# Patient Record
Sex: Male | Born: 1960 | Race: White | Hispanic: No | Marital: Married | Smoking: Current every day smoker
Health system: Southern US, Community
[De-identification: ages and names within clinical notes are randomized; demographics above are authoritative.]

## PROBLEM LIST (undated history)

## (undated) ENCOUNTER — Emergency Department (HOSPITAL_COMMUNITY): Discharge status: Against Medical Advice | Payer: Self-pay

## (undated) DIAGNOSIS — C801 Malignant (primary) neoplasm, unspecified: Secondary | ICD-10-CM

## (undated) DIAGNOSIS — E785 Hyperlipidemia, unspecified: Secondary | ICD-10-CM

## (undated) DIAGNOSIS — I639 Cerebral infarction, unspecified: Secondary | ICD-10-CM

## (undated) DIAGNOSIS — E876 Hypokalemia: Secondary | ICD-10-CM

## (undated) DIAGNOSIS — I2510 Atherosclerotic heart disease of native coronary artery without angina pectoris: Secondary | ICD-10-CM

## (undated) DIAGNOSIS — M899 Disorder of bone, unspecified: Secondary | ICD-10-CM

## (undated) DIAGNOSIS — M726 Necrotizing fasciitis: Secondary | ICD-10-CM

## (undated) DIAGNOSIS — M75100 Unspecified rotator cuff tear or rupture of unspecified shoulder, not specified as traumatic: Secondary | ICD-10-CM

## (undated) DIAGNOSIS — I2119 ST elevation (STEMI) myocardial infarction involving other coronary artery of inferior wall: Secondary | ICD-10-CM

## (undated) DIAGNOSIS — I10 Essential (primary) hypertension: Secondary | ICD-10-CM

## (undated) HISTORY — DX: Atherosclerotic heart disease of native coronary artery without angina pectoris: I25.10

## (undated) HISTORY — DX: Hypokalemia: E87.6

## (undated) HISTORY — DX: Essential (primary) hypertension: I10

## (undated) HISTORY — PX: TONSILLECTOMY: SUR1361

## (undated) HISTORY — DX: Hyperlipidemia, unspecified: E78.5

## (undated) HISTORY — DX: ST elevation (STEMI) myocardial infarction involving other coronary artery of inferior wall: I21.19

## (undated) HISTORY — PX: CORONARY ANGIOPLASTY WITH STENT PLACEMENT: SHX49

## (undated) HISTORY — DX: Disorder of bone, unspecified: M89.9

## (undated) HISTORY — PX: CYST EXCISION: SHX5701

---

## 2001-06-13 ENCOUNTER — Encounter: Payer: Self-pay | Admitting: Family Medicine

## 2001-06-13 ENCOUNTER — Ambulatory Visit (HOSPITAL_COMMUNITY): Admission: RE | Admit: 2001-06-13 | Discharge: 2001-06-13 | Payer: Self-pay | Admitting: Family Medicine

## 2001-07-27 ENCOUNTER — Emergency Department (HOSPITAL_COMMUNITY): Admission: EM | Admit: 2001-07-27 | Discharge: 2001-07-28 | Payer: Self-pay | Admitting: Emergency Medicine

## 2008-05-13 ENCOUNTER — Inpatient Hospital Stay (HOSPITAL_COMMUNITY): Admission: EM | Admit: 2008-05-13 | Discharge: 2008-05-15 | Payer: Self-pay | Admitting: Cardiology

## 2008-05-13 ENCOUNTER — Ambulatory Visit: Payer: Self-pay | Admitting: Cardiology

## 2008-05-17 ENCOUNTER — Ambulatory Visit: Payer: Self-pay | Admitting: Cardiology

## 2008-06-05 DIAGNOSIS — I10 Essential (primary) hypertension: Secondary | ICD-10-CM

## 2008-06-05 DIAGNOSIS — I2119 ST elevation (STEMI) myocardial infarction involving other coronary artery of inferior wall: Secondary | ICD-10-CM | POA: Insufficient documentation

## 2008-06-05 DIAGNOSIS — E785 Hyperlipidemia, unspecified: Secondary | ICD-10-CM

## 2008-06-05 DIAGNOSIS — I2510 Atherosclerotic heart disease of native coronary artery without angina pectoris: Secondary | ICD-10-CM | POA: Insufficient documentation

## 2010-07-08 LAB — CBC
HCT: 38.2 % — ABNORMAL LOW (ref 39.0–52.0)
HCT: 39 % (ref 39.0–52.0)
HCT: 40.6 % (ref 39.0–52.0)
Hemoglobin: 14.1 g/dL (ref 13.0–17.0)
Hemoglobin: 14.4 g/dL (ref 13.0–17.0)
MCHC: 35.6 g/dL (ref 30.0–36.0)
MCHC: 35.8 g/dL (ref 30.0–36.0)
MCV: 86.5 fL (ref 78.0–100.0)
MCV: 87.2 fL (ref 78.0–100.0)
Platelets: 166 10*3/uL (ref 150–400)
Platelets: 213 10*3/uL (ref 150–400)
RBC: 4.65 MIL/uL (ref 4.22–5.81)
RDW: 14.6 % (ref 11.5–15.5)
RDW: 15.5 % (ref 11.5–15.5)
WBC: 9.8 10*3/uL (ref 4.0–10.5)

## 2010-07-08 LAB — BASIC METABOLIC PANEL
BUN: 8 mg/dL (ref 6–23)
BUN: 8 mg/dL (ref 6–23)
CO2: 23 mEq/L (ref 19–32)
CO2: 25 mEq/L (ref 19–32)
CO2: 26 mEq/L (ref 19–32)
CO2: 26 mEq/L (ref 19–32)
Calcium: 8.1 mg/dL — ABNORMAL LOW (ref 8.4–10.5)
Calcium: 8.3 mg/dL — ABNORMAL LOW (ref 8.4–10.5)
Calcium: 8.5 mg/dL (ref 8.4–10.5)
Chloride: 105 mEq/L (ref 96–112)
Chloride: 108 mEq/L (ref 96–112)
Chloride: 110 mEq/L (ref 96–112)
Creatinine, Ser: 0.67 mg/dL (ref 0.4–1.5)
GFR calc Af Amer: >60 mL/min (ref >60)
GFR calc Af Amer: >60 mL/min (ref >60)
GFR calc Af Amer: >60 mL/min (ref >60)
GFR calc non Af Amer: >60 mL/min (ref >60)
GFR calc non Af Amer: >60 mL/min (ref >60)
Glucose, Bld: 108 mg/dL — ABNORMAL HIGH (ref 70–99)
Glucose, Bld: 114 mg/dL — ABNORMAL HIGH (ref 70–99)
Glucose, Bld: 133 mg/dL — ABNORMAL HIGH (ref 70–99)
Potassium: 3.2 mEq/L — ABNORMAL LOW (ref 3.5–5.1)
Potassium: 3.2 mEq/L — ABNORMAL LOW (ref 3.5–5.1)
Potassium: 3.5 mEq/L (ref 3.5–5.1)
Potassium: 3.8 mEq/L (ref 3.5–5.1)
Sodium: 135 mEq/L (ref 135–145)
Sodium: 140 mEq/L (ref 135–145)
Sodium: 141 mEq/L (ref 135–145)

## 2010-07-08 LAB — LIPID PANEL
Cholesterol: 121 mg/dL (ref 0–200)
HDL: 20 mg/dL — ABNORMAL LOW (ref >39)
Total CHOL/HDL Ratio: 6.1 RATIO
VLDL: 16 mg/dL (ref 0–40)

## 2010-07-08 LAB — CARDIAC PANEL(CRET KIN+CKTOT+MB+TROPI)
CK, MB: 212.9 ng/mL — ABNORMAL HIGH (ref 0.3–4.0)
Relative Index: 10.1 — ABNORMAL HIGH (ref 0.0–2.5)
Total CK: 2109 U/L — ABNORMAL HIGH (ref 7–232)
Total CK: 704 U/L — ABNORMAL HIGH (ref 7–232)
Troponin I: 45.96 ng/mL (ref 0.00–0.06)

## 2010-07-08 LAB — POCT I-STAT, CHEM 8
BUN: 12 mg/dL (ref 6–23)
Calcium, Ion: 1.15 mmol/L (ref 1.12–1.32)
Chloride: 106 mEq/L (ref 96–112)
Creatinine, Ser: 0.7 mg/dL (ref 0.4–1.5)
Glucose, Bld: 140 mg/dL — ABNORMAL HIGH (ref 70–99)
HCT: 42 % (ref 39.0–52.0)
Hemoglobin: 14.3 g/dL (ref 13.0–17.0)
Potassium: 3.4 mEq/L — ABNORMAL LOW (ref 3.5–5.1)
Sodium: 144 mEq/L (ref 135–145)
TCO2: 23 mmol/L (ref 0–100)

## 2010-07-08 LAB — HEMOGLOBIN A1C: Mean Plasma Glucose: 114 mg/dL

## 2010-08-05 NOTE — H&P (Signed)
NAMENAHSHON, REICH NO.:  [id number]   MEDICAL RECORD NO.:  [id number]          PATIENT TYPE:  INP   LOCATION:  2911                         FACILITY:  [facility]   PHYSICIAN:  Rollene Rotunda, MD, FACCDATE OF BIRTH:  1960-09-12   DATE OF ADMISSION:  05/13/2008  DATE OF DISCHARGE:                              HISTORY & PHYSICAL   PRIMARY:  None.   CARDIOLOGIST:  Charlies Constable, MD.   REASON FOR PRESENTATION:  Evaluate patient with inferior infarct and  atrial fibrillation.   HISTORY OF PRESENT ILLNESS:  The patient is a pleasant 50 year old  gentleman without prior cardiac history.  He awoke at 3 a.m. with chest  pain.  This was substernal.  It was 10/10.  He had his family drive him  to [facility].  He arrived there at 3:34 a.m.  An  electrocardiogram demonstrated atrial fibrillation with a rapid rate and  inferior ST-segment elevation in II, III and aVF.  He was treated with  Plavix (it appears that it was 600 mg), heparin, aspirin, IV  nitroglycerin, metoprolol and morphine.  His pain remained 8/10.  He was  transferred urgently to [facility] and arrived here at 4:47 a.m.  He is  now getting urgent cardiac catheterization by Dr. Charlies Constable.   The patient said he had no symptoms prior to this.  He has not  previously had chest pain or resting shortness of breath.  He will  rarely have palpitations, has had no presyncope or syncope and no  sustained tachyarrhythmias.   PAST MEDICAL HISTORY:  None.   PAST SURGICAL HISTORY:  None.   ALLERGIES:  None.   MEDICATIONS:  None.   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  He had 3 packs per  day of tobacco.   FAMILY HISTORY:  Not obtained fro the patient.   REVIEW OF SYSTEMS:  As stated in the HPI and positive for some  hemorrhoids.  Negative for all other systems.   PHYSICAL EXAMINATION:  The patient is in mild distress.  Blood pressure  160/100, heart rate 154 and irregular, respiratory  rate 20.  HEENT:  Eyes unremarkable, pupils equal, round and react to light, fundi  not visualized, oral mucosa unremarkable.  NECK:  No jugular venous  distention at 45 degrees, carotid upstroke brisk and symmetric, no  bruits, no thyromegaly.  LYMPHATICS:  No cervical or inguinal adenopathy.  LUNGS:  Decreased breath sounds but no wheezing or crackles.  BACK:  No costovertebral angle tenderness.  CHEST:  Unremarkable.  HEART:  PMI not displaced or sustained, S1 and S2 within normal.  No S3,  no murmurs.  ABDOMEN:  Obese, positive bowel sounds normal in frequency and pitch, no  bruits, no rebound, no guarding or midline pulsatile mass, no  organomegaly.  SKIN:  No rashes, no nodules.  EXTREMITIES:  Pulses 2+, no edema.   LABS:  Pending.   Electrocardiogram as above.   ASSESSMENT/PLAN:  1. Acute inferior myocardial infarction.  The patient is urgently in      the cath lab for  catheterization.  This is per Dr. Juanda Chance.  2. Atrial fibrillation.  Will control the rate initially with      verapamil and Cardizem as needed.  Will consider cardioversion      during this hospitalization.  3. Tobacco.  Will educate the patient.  4. Dyslipidemia.  Will empirically start him on statins and check a      fasting lipid profile.  5. Other.  Will check a hemoglobin A1c.      Rollene Rotunda, MD, [facility]  Electronically Signed     JH/MEDQ  D:  05/13/2008  T:  05/13/2008  Job:  [id number]

## 2010-08-05 NOTE — Cardiovascular Report (Signed)
Ian Roberts, Ian Roberts NO.:  [id number]   MEDICAL RECORD NO.:  [id number]          PATIENT TYPE:  INP   LOCATION:  2911                         FACILITY:  [facility]   PHYSICIAN:  Everardo Beals. Juanda Chance, MD, FACCDATE OF BIRTH:  [birth year]   DATE OF PROCEDURE:  05/13/2008  DATE OF DISCHARGE:                            CARDIAC CATHETERIZATION   Cardiac catheterization and percutaneous transluminal coronary  angioplasty.   CLINICAL HISTORY:  Mr. Buysse is 50 years old and no prior history of  known heart disease.  He has been on no medications.  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  He developed the onset  of chest pain at 3:00 a.m. and went to [facility] where his ECG  showed an inferior infarction and atrial fibrillation.  Code STEMI was  called and he was transferred to [place] for intervention.  He was seen by  Dr. Antoine Poche in the cath lab.   PROCEDURE:  Procedure was followed by the right femoral arterial sheath  and 6-French preformed coronary catheters.  After completion of  diagnostic study, we made a decision and proceeded with intervention on  the totally occluded distal circumflex artery.   The patient was given Angiomax bolus and infusion.  He had been given  600 mg of clopidogrel and 4 chewable aspirin at [facility].  We  used a CLS4 guiding catheter with side holes and a Prowater wire.  We  crossed the lesion with the wire without difficulty.  We dilated the  lesion with a 2.25 x 50-mm apex balloon performing 2 inflation up to 8  atmospheres for 30 seconds.  We realized there was a bifurcation of 2  posterolateral branches.  The branches were similar in caliber.  We  elected to stent in the AV branch and crossed the first posterolateral  branch.  We used a 2.5 x 18 mm XIENCE stent and deployed this with one  inflation of 9 atmospheres for 30 seconds.  We then postdilated with  2.75 x 15-mm [place] Voyager performing 2 inflations  up to 15 atmospheres for  30 seconds.  This pinched the first posterolateral branch to about 80%  with brisk flow and we felt that did not need treatment.  The left  angiogram was performed at the end of the procedure.  The patient  tolerated the procedure well and left the laboratory in satisfactory  condition.  The right femoral artery was closed with Angio-Seal at the  end of the procedure.   RESULTS:  LEFT MAIN CORONARY ARTERY:  The left main coronary artery was  free of significant disease.   LEFT ANTERIOR DESCENDING ARTERY:  The left descending artery gave rise  to a large septal perforator, and large diagonal branches and a second  septal perforator and second moderate sized diagonal branch.  There was  40% narrowing in the proximal LAD and 40% narrowing in the mid LAD.   CIRCUMFLEX ARTERY:  The circumflex artery gave rise to a large ramus  branch, small marginal branch, and atrial branch, and then was  completely  occluded distally with hangup of contrast.   RIGHT CORONARY ARTERY:  The right coronary artery was a moderate-sized  vessel gave rise to a right ventricular branch, posterior descending  branch, and two small posterolateral branches.  These vessels were  irregular, but free of major obstruction.   The left ventriculogram performed after the PCI showed very minimal  hypokinesis of the mid inferior wall.  The estimated ejection fraction  was 60%.   Following stenting of the lesion in distal circumflex, stenosis improved  from 100% to 0%.  The first posterolateral branch which was crossed and  jailed by the stent was pinched to about 80%, but with brisk TIMI-3  flow.   The patient had the onset of chest pain at 3 a.m. and arrived at  [facility] at 3:37 a.m.  He arrived in the cath lab at 4:47 a.m.  First balloon inflation was at 5:24.  This gave a __________balloon time  from [facility] of 107 minutes and a reperfusion time of 2 hours  and 24  minutes.   CONCLUSION:  1. Acute diaphragmatic wall infarction with total occlusion of the      distal circumflex artery, 40% narrowing in the proximal and 40%      narrowing in the mid LAD, irregularities in the right coronary      artery and minimal inferior wall hypokinesis.  2. Successful  reperfusion and PCI of the distal circumflex artery      with improvement in center narrowing from 100% to 0%, improvement      in the flow from TIMI-0 to TIMI-3 flow with pinching of jailed side      branch (posterolateral branch-1) to 80%.   DISPOSITION:  The patient was returned to post angio room for further  observation.  He was in atrial fibrillation during the procedure and he  was treated with IV Cardizem and converted to sinus rhythm during the  procedure.  He is a heavy smoker and stopping smoking and compliance  with Plavix will be extremely important.  We will target discharge on  day #2, which would be Tuesday.  Dr. Antoine Poche had discussed with the  patient taking Plavix before we did the procedure and he felt that he  could manage despite the fact that he has no insurance.       Bruce Elvera Lennox Juanda Chance, MD, [facility]  Electronically Signed     BRB/MEDQ  D:  05/13/2008  T:  05/13/2008  Job:  [id number]   cc:   Kathrynn Ducking, MD, [facility]

## 2010-08-05 NOTE — Assessment & Plan Note (Signed)
[facility]                       [place] [facility] OFFICE NOTE   NAME:Ian Roberts, Ian Roberts                       MRN:          [id number]  DATE:05/17/2008                            DOB:          [birth year]    CARDIOLOGIST:  Jonelle Sidle, MD   PRIMARY CARE PHYSICIAN:  None.   REASON FOR VISIT:  Post-hospitalization followup.   HISTORY OF PRESENT ILLNESS:  Ian Roberts is a 50 year old male who  recently presented to [facility] with chest pain and EKG evidence  of an inferior ST elevation myocardial infarction.  He developed atrial  fibrillation with rapid ventricular rate.  He was transferred to [facility] for further evaluation and therapy.  He received IV  diltiazem in the cath lab and converted to sinus rhythm.  His cardiac  catheterization demonstrated a total occlusion of the distal left  circumflex artery which was treated with XIENCE drug-eluting stent by  Dr. Juanda Chance.  His other disease included a 40% proximal, 40% mid LAD  stenosis, and irregularities in the RCA.  His EF was 60% and he had very  minimal hypokinesis of the mid inferior wall.  The patient also  underwent CT angiogram of his abdomen.  Review of his lab work does  demonstrate that he had a potassium of 3.2 to 3.4 during his admission.  The CT scan was negative for renal artery stenosis.  He did have mild  atheromatous plaque in the nondilated abdominal aorta and there was no  adrenal mass noted.  The patient's chest x-ray showed mild bibasilar  atelectasis.  His peak troponin was 45.96 and his peak CK-MB was 212.9.  His hemoglobin was normal 14.4.  The patient was apparently told that he  would be going home on Wednesday (yesterday).  However, his family lives  in [place] and he was concerned about severe weather coming and states that  he wanted to get discharged on Tuesday night.  This was apparently not  arranged to his liking, and the patient decided to  leave against medical  advice.  He states he did not receive any prescriptions prior to  leaving.  Therefore, he has only been taking aspirin 325 mg a day since  yesterday.  In the office, he notes he is doing well without chest pain  or shortness of breath.  He denies orthopnea, PND, or pedal edema.  Denies any syncope.  He denies any problems with his right groin site.   CURRENT MEDICATIONS:  Aspirin 325 mg daily.   PHYSICAL EXAMINATION:  GENERAL:  He is a well-nourished, well-developed  male in no acute distress.  VITAL SIGNS:  Blood pressure is 130/84, pulse 87, and weight 209 pounds.  HEENT:  Normal neck without JVD.  CARDIAC:  Normal S1 and S2.  Regular rate and rhythm.  LUNGS:  Clear to auscultation bilaterally.  ABDOMEN:  Soft and nontender.  EXTREMITIES:  Without edema.  NEUROLOGIC:  He is alert and oriented x3.  Cranial nerves II-XII grossly  intact.  VASCULAR:  Right femoral arteriotomy site without hematoma or bruit.   Electrocardiogram reveals sinus rhythm  with a heart of 87.  Normal axis.  No acute changes.   ASSESSMENT AND PLAN:  1. Coronary artery disease, status post recent inferior ST elevation      myocardial infarction treated with XIENCE drug-eluting stent to the      circumflex with overall preserved left ventricular function and      very minimal inferior wall hypokinesis on left ventriculogram at      cardiac catheterization.  As noted above, he had minimal coronary      artery disease located in his proximal and mid left anterior      descending.  Unfortunately, the patient left [facility] against      medical advice.  He also has no insurance.  He is not taking any      medications that he was taking in the hospital except aspirin.  I      had a long discussion with him today regarding the importance of      taking aspirin and Plavix for the next 12 months.  He understands      this.  We will try to enroll him in the assistance program and we      will  hopefully be able to give him 7 tablets of Plavix today.  He      will also be placed on metoprolol 25 mg b.i.d.  He also will be      placed on simvastatin 10 mg nightly.  We will also give him p.r.n.      nitroglycerin.  He states that he has committed to quitting smoking      and has not smoked in the last several days and he is currently      wearing a nicotine patch.  We will refer him to Cardiac Rehab.  I      explained to him that if he cannot afford this, he should start      walking and slowly increase to 30 minutes a day.  We will see him      back in close followup in the next couple of weeks.  He prefers to      come to our [facility] [employer] [facility], although he lives in [place].  2. Dyslipidemia.  His total cholesterol is 121, triglycerides 81, HDL      20, and LDL 85.  As noted above, we will start him on  simvastatin.      Recheck lipids and LFTs in 3 months.  3. Hypokalemia.  I assume he underwent a CT scan of the abdomen to      rule out the possibility of hyperaldosteronism and adrenal adenoma.      As noted above, this was negative for adrenal mass.  We will      recheck his potassium with a BMET today.  If he continues to be      hypokalemic, we can add potassium to his medical regimen.   DISPOSITION:  The patient will follow up with me or Dr. Diona Browner in the  next 2 weeks or sooner p.r.n.      Tereso Newcomer, PA-C  Electronically Signed      Jonelle Sidle, MD  Electronically Signed   SW/MedQ  DD: 05/17/2008  DT: 05/18/2008  Job #: [id number]

## 2010-08-08 NOTE — Discharge Summary (Signed)
NAMEPARLEY, PIDCOCK NO.:  [id number]   MEDICAL RECORD NO.:  [id number]          PATIENT TYPE:  INP   LOCATION:  4732                         FACILITY:  [facility]   PHYSICIAN:  Madolyn Frieze. Jens Som, MD, FACCDATE OF BIRTH:  08/30/1960   DATE OF ADMISSION:  05/13/2008  DATE OF DISCHARGE:  05/15/2008                               DISCHARGE SUMMARY   PRIMARY CARDIOLOGIST:  Everardo Beals. Juanda Chance, MD, [facility]   PRIMARY CARE PHYSICIAN:  None.   PROCEDURES PERFORMED DURING HOSPITALIZATION:  1. Cardiac catheterization.  This was completed on May 13, 2008      by Dr. Charlies Constable.      a.     Acute diaphragmatic wall infarction with total occlusion of       the distal coronary artery, 40% narrowing in the proximal and 40%       narrowing in the mid left anterior descending, irregularities in       the right coronary artery and minimal inferior wall hypokinesis.      b.     Successful reperfusion and percutaneous coronary       intervention of the distal circumflex artery with improvement in       the central narrowing from 100% to 0%, improvement in the flow       from TIMI 0 to TIMI III flow with pinching and jailed branch,       posterolateral - 1 to 80%.   FINAL DISCHARGE DIAGNOSES:  1. Coronary artery disease.  A:  Status post cardiac catheterization revealing total occlusion of the  distal left circumflex artery with percutaneous coronary intervention to  the circumflex artery decreasing from 100% to 0% stenosis with  improvement of flow.  1. Inferior ST elevated myocardial infarction.  2. Hypokalemia.  3. Tobacco abuse.  4. Atrial fibrillation resolved after percutaneous coronary      intervention.  5. Nonsustained ventricular tachycardia, resolved secondary to early      reperfusion.   HOSPITAL COURSE:  This is a 50 year old male without prior cardiac  history who awoke at 3 a.m. on morning of admission which chest pain,  substernal 10/10.  His family drove him to  [facility] where he  arrived and electrocardiogram revealed atrial fibrillation with RVR and  ST-segment elevation in leads II, III and aVF.  The patient was treated  with Plavix 60 mg, heparin, aspirin, IV nitro, metoprolol, morphine and  he was transferred urgently to [facility] for emergent cardiac  catheterization completed by Dr. Charlies Constable.   The patient did have catheterization as described above.  Please see Dr.  Regino Schultze thorough cardiac catheterization note for more details.  The  patient recovered well without evidence of bleeding, hematoma, or signs  of infection.  The patient was enrolled in the [place] registry as a  result of the patient's ST elevated MI and being talked to by [facility]  to be added to the [facility] patient list.  The patient was started on  atorvastatin, metoprolol and smoking cessation was completed.  On day of  discharge, the patient left  [facility] as it was snowing and he did not wish to  stay the night and wanted to get home before he was snowed in and  therefore signed AMA papers in order to be discharged.  Nicolasa Ducking, nurse practitioner for [facility] was informed.  The  patient left without prescriptions for medications and followup  appointments.  The patient should follow up with [facility] cardiologist;  however, it is uncertain whether he will do this as he did leave AMA.   MEDICATIONS:  The patient should be taking on discharge atorvastatin 80  mg daily, metoprolol 25 mg b.i.d. lisinopril 5 mg daily.   ALLERGIES:  No known drug allergies.   LABS ON DISCHARGE:  Cholesterol 121, triglycerides 81, HDL 20, LDL 85.  Sodium 135, potassium 3.2, chloride 105, CO2 23, glucose 119, BUN 9,  creatinine 0.76.  Hemoglobin 14.4, hematocrit 40.6, white blood cells  9.8, platelets are 168.  Hemoglobin A1c 5.6, troponin 22.75 and 45.96  respectively.   FOLLOWUP PLANS AND APPOINTMENT:  The patient has been advised to stay in  the hospital  that he did sign out AMA.  He is to follow up with  cardiologist in [place].  The patient has been advised on smoking cessation  and also on care of right groin site for evidence of bleeding, hematoma,  or infection.      Bettey Mare. Lyman Bishop, NP      Madolyn Frieze. Jens Som, MD, [facility]  Electronically Signed    KML/MEDQ  D:  07/24/2008  T:  07/25/2008  Job:  [id number]

## 2011-03-09 ENCOUNTER — Encounter: Payer: Self-pay | Admitting: Cardiology

## 2018-01-22 ENCOUNTER — Emergency Department (HOSPITAL_COMMUNITY): Payer: Medicaid Other

## 2018-01-22 ENCOUNTER — Other Ambulatory Visit: Payer: Self-pay

## 2018-01-22 ENCOUNTER — Encounter (HOSPITAL_COMMUNITY): Payer: Self-pay | Admitting: Emergency Medicine

## 2018-01-22 ENCOUNTER — Emergency Department (HOSPITAL_COMMUNITY)
Admission: EM | Admit: 2018-01-22 | Discharge: 2018-01-22 | Disposition: A | Discharge status: Home / Self Care | Payer: Medicaid Other | Attending: Emergency Medicine | Admitting: Emergency Medicine

## 2018-01-22 DIAGNOSIS — I10 Essential (primary) hypertension: Secondary | ICD-10-CM | POA: Diagnosis not present

## 2018-01-22 DIAGNOSIS — Z8673 Personal history of transient ischemic attack (TIA), and cerebral infarction without residual deficits: Secondary | ICD-10-CM | POA: Insufficient documentation

## 2018-01-22 DIAGNOSIS — M899 Disorder of bone, unspecified: Secondary | ICD-10-CM

## 2018-01-22 DIAGNOSIS — Z955 Presence of coronary angioplasty implant and graft: Secondary | ICD-10-CM | POA: Diagnosis not present

## 2018-01-22 DIAGNOSIS — F17210 Nicotine dependence, cigarettes, uncomplicated: Secondary | ICD-10-CM | POA: Diagnosis not present

## 2018-01-22 DIAGNOSIS — M7591 Shoulder lesion, unspecified, right shoulder: Secondary | ICD-10-CM | POA: Diagnosis not present

## 2018-01-22 DIAGNOSIS — I2510 Atherosclerotic heart disease of native coronary artery without angina pectoris: Secondary | ICD-10-CM | POA: Insufficient documentation

## 2018-01-22 DIAGNOSIS — M25511 Pain in right shoulder: Secondary | ICD-10-CM | POA: Diagnosis present

## 2018-01-22 HISTORY — DX: Unspecified rotator cuff tear or rupture of unspecified shoulder, not specified as traumatic: M75.100

## 2018-01-22 HISTORY — DX: Cerebral infarction, unspecified: I63.9

## 2018-01-22 HISTORY — DX: Necrotizing fasciitis: M72.6

## 2018-01-22 LAB — BASIC METABOLIC PANEL
Anion gap: 10 (ref 5–15)
BUN: 15 mg/dL (ref 6–20)
CALCIUM: 9.4 mg/dL (ref 8.9–10.3)
CO2: 20 mmol/L — ABNORMAL LOW (ref 22–32)
Chloride: 105 mmol/L (ref 98–111)
Creatinine, Ser: 0.88 mg/dL (ref 0.61–1.24)
GFR calc Af Amer: >60 mL/min (ref >60)
GLUCOSE: 201 mg/dL — AB (ref 70–99)
Potassium: 2.7 mmol/L — CL (ref 3.5–5.1)
SODIUM: 135 mmol/L (ref 135–145)

## 2018-01-22 LAB — CBC WITH DIFFERENTIAL/PLATELET
Abs Immature Granulocytes: 0.11 10*3/uL — ABNORMAL HIGH (ref 0.00–0.07)
BASOS PCT: 0 %
Basophils Absolute: 0 10*3/uL (ref 0.0–0.1)
EOS ABS: 0.1 10*3/uL (ref 0.0–0.5)
Eosinophils Relative: 0 %
HCT: 37.9 % — ABNORMAL LOW (ref 39.0–52.0)
Hemoglobin: 12.9 g/dL — ABNORMAL LOW (ref 13.0–17.0)
Immature Granulocytes: 1 %
Lymphocytes Relative: 8 %
Lymphs Abs: 1.4 10*3/uL (ref 0.7–4.0)
MCH: 28.8 pg (ref 26.0–34.0)
MCHC: 34 g/dL (ref 30.0–36.0)
MCV: 84.6 fL (ref 80.0–100.0)
MONO ABS: 0.8 10*3/uL (ref 0.1–1.0)
MONOS PCT: 4 %
Neutro Abs: 15.5 10*3/uL — ABNORMAL HIGH (ref 1.7–7.7)
Neutrophils Relative %: 87 %
PLATELETS: 370 10*3/uL (ref 150–400)
RBC: 4.48 MIL/uL (ref 4.22–5.81)
RDW: 14.1 % (ref 11.5–15.5)
WBC: 17.9 10*3/uL — AB (ref 4.0–10.5)
nRBC: 0 % (ref 0.0–0.2)

## 2018-01-22 MED ORDER — POTASSIUM CHLORIDE CRYS ER 20 MEQ PO TBCR: 40.0000 meq | EXTENDED_RELEASE_TABLET | Freq: Every day | ORAL | 0 refills | Status: DC

## 2018-01-22 MED ORDER — KETOROLAC TROMETHAMINE 30 MG/ML IJ SOLN
30.0000 mg | Freq: Once | INTRAMUSCULAR | Status: AC
  Administered 2018-01-22: 30 mg via INTRAMUSCULAR
  Filled 2018-01-22: qty 1

## 2018-01-22 MED ORDER — MORPHINE SULFATE (PF) 4 MG/ML IV SOLN
4.0000 mg | Freq: Once | INTRAVENOUS | Status: AC
  Administered 2018-01-22: 4 mg via INTRAVENOUS
  Filled 2018-01-22: qty 1

## 2018-01-22 MED ORDER — POTASSIUM CHLORIDE CRYS ER 20 MEQ PO TBCR
40.0000 meq | EXTENDED_RELEASE_TABLET | Freq: Once | ORAL | Status: AC
  Administered 2018-01-22: 40 meq via ORAL
  Filled 2018-01-22: qty 2

## 2018-01-22 MED ORDER — MORPHINE SULFATE 30 MG PO TABS: 30.0000 mg | ORAL_TABLET | Freq: Four times a day (QID) | ORAL | 0 refills | Status: DC | PRN

## 2018-01-22 MED ORDER — ONDANSETRON HCL 4 MG/2ML IJ SOLN
4.0000 mg | Freq: Once | INTRAMUSCULAR | Status: AC
  Administered 2018-01-22: 4 mg via INTRAVENOUS
  Filled 2018-01-22: qty 2

## 2018-01-22 MED ORDER — CYCLOBENZAPRINE HCL 10 MG PO TABS
10.0000 mg | ORAL_TABLET | Freq: Once | ORAL | Status: AC
  Administered 2018-01-22: 10 mg via ORAL
  Filled 2018-01-22: qty 1

## 2018-01-22 MED ORDER — ONDANSETRON 4 MG PO TBDP: 4.0000 mg | ORAL_TABLET | Freq: Three times a day (TID) | ORAL | 0 refills | Status: DC | PRN

## 2018-01-22 NOTE — ED Notes (Signed)
Date and time results received: 01/22/18 8:54 PM (use smartphrase ".now" to insert current time)  Test: K+ Critical Value: 2.7  Name of Provider Notified: Dr Coralyn Helling  Orders Received? Or Actions Taken?: not at this time. see EMR for details

## 2018-01-22 NOTE — ED Provider Notes (Signed)
Emergency Department Provider Note   I have reviewed the triage vital signs and the nursing notes.   HISTORY  Chief Complaint Shoulder Pain   HPI Ian Roberts is a 57 y.o. male with PMH of CAD, HLD, and HTN resents to the emergency department for evaluation of right shoulder pain.  Symptoms have been severe and worsening over the past several days.  He has been seen at the [facility] emergency department, his PCPs office, and a local orthopedic clinic.  He had a steroid injection yesterday and this morning at 3 AM had sudden worsening pain.  He denies any fevers or chills.  He has severe pain that is worse with movement of the joint.  He has been taking 800 mg ibuprofen and leftover Percocet at home with no significant relief.  He states the Percocet makes him vomit and he is concerned about developing a dependency on this medication.  The pain does sometimes radiate to the right bicep and right lateral neck but most of the pain is worse in the right shoulder.    Past Medical History:  Diagnosis Date  . CAD (coronary artery disease)   . Dyslipidemia   . Hypertension   . Hypokalemia   . Necrotizing fasciitis ([place])   . Rotator cuff tear   . ST elevation myocardial infarction (STEMI) of inferior wall (HCC)    Assocaited with paroxysmal atrial fibrillation 05/13/2008; Xience drug eluting stent  . Stroke [facility])     Patient Active Problem List   Diagnosis Date Noted  . DYSLIPIDEMIA 06/05/2008  . HYPERTENSION, BENIGN ESSENTIAL 06/05/2008  . ACUT MI OTH INF WALL SUBSQT EPIS CARE 06/05/2008  . CORONARY ATHEROSCLEROSIS NATIVE CORONARY ARTERY 06/05/2008    Past Surgical History:  Procedure Laterality Date  . CORONARY ANGIOPLASTY WITH STENT PLACEMENT    . CYST EXCISION    . TONSILLECTOMY      Allergies Hydrocodone  No family history on file.  Social History Social History   Tobacco Use  . Smoking status: Current Every Day Smoker    Packs/day: 1.50    Years: 40.00   Pack years: 60.00    Types: Cigarettes  . Smokeless tobacco: Never Used  Substance Use Topics  . Alcohol use: Never    Frequency: Never  . Drug use: Never    Review of Systems  Constitutional: No fever/chills Eyes: No visual changes. ENT: No sore throat. Cardiovascular: Denies chest pain. Respiratory: Denies shortness of breath. Gastrointestinal: No abdominal pain.  No nausea, no vomiting.  No diarrhea.  No constipation. Genitourinary: Negative for dysuria. Musculoskeletal: Negative for back pain. Positive right shoulder pain.  Skin: Negative for rash. Neurological: Negative for headaches, focal weakness or numbness.  10-point ROS otherwise negative.  ____________________________________________   PHYSICAL EXAM:  VITAL SIGNS: ED Triage Vitals  Enc Vitals Group     BP 01/22/18 1835 (!) 158/80     Pulse Rate 01/22/18 1835 77     Resp 01/22/18 1835 20     Temp 01/22/18 1835 (!) 97.5 F (36.4 C)     Temp Source 01/22/18 1835 Oral     SpO2 01/22/18 1835 98 %     Weight 01/22/18 1835 210 lb (95.3 kg)     Height 01/22/18 1835 5\' 11"  (1.803 m)     Pain Score 01/22/18 1836 10   Constitutional: Alert and oriented. Well appearing and in no acute distress. Eyes: Conjunctivae are normal.  Head: Atraumatic. Nose: No congestion/rhinnorhea. Mouth/Throat: Mucous membranes are moist.  Neck: No stridor. No cervical spine tenderness to palpation. Cardiovascular: Good peripheral circulation.  Respiratory: Normal respiratory effort.  Gastrointestinal: No distention.  Musculoskeletal: No lower extremity tenderness nor edema. No gross deformities of extremities. Pain with active and passive ROM of the right shoulder. No elbow or wrist tenderness.  Neurologic:  Normal speech and language. No gross focal neurologic deficits are appreciated.  Skin:  Skin is warm, dry and intact. No rash noted.  ____________________________________________  FUXNATFTD  Dg Shoulder Right  Result Date:  01/22/2018 CLINICAL DATA:  Right shoulder pain for the past month. EXAM: RIGHT SHOULDER - 2+ VIEW COMPARISON:  Right shoulder x-rays dated December 26, 2017. FINDINGS: No acute fracture or dislocation. There is increasing lucency and cortical irregularity of the superior glenoid extending into the coracoid with periosteal reaction. Joint spaces are preserved. Soft tissues are unremarkable. IMPRESSION: 1. Increasing lucency and cortical irregularity of the superior glenoid extending into the coracoid, concerning for underlying destructive bony lesion or infection. Recommend CT or MRI of the right shoulder for further evaluation. Electronically Signed   By: Titus Dubin M.D.   On: 01/22/2018 19:42   Ct Shoulder Right Wo Contrast  Result Date: 01/22/2018 CLINICAL DATA:  Right shoulder pain for the past month. Possible glenoid destruction seen on x-ray. EXAM: CT OF THE UPPER RIGHT EXTREMITY WITHOUT CONTRAST TECHNIQUE: Multidetector CT imaging of the upper right extremity was performed according to the standard protocol. COMPARISON:  Right shoulder x-rays from same day. FINDINGS: Bones/Joint/Cartilage Large destructive lytic lesion involving the glenoid and coracoid, measuring approximately 5.2 x 4.0 x 4.9 cm. There is extra osseous extension anteriorly deep to the subscapularis muscle and posteriorly into the supraspinatus muscle. There is a smaller 1.3 x 1.6 x 2.1 cm lesion just slightly inferiorly involving the infra glenoid tubercle which appears separate from the larger, more superior lesion. No acute fracture or dislocation. No joint effusion. Mild glenohumeral joint space narrowing. Mild acromioclavicular osteoarthritis. Ligaments Suboptimally assessed by CT. Muscles and Tendons Involvement of the supraspinatus and subscapularis fossae as above without clear fat plane between the lesion and the respective muscles. No muscle atrophy. Soft tissues No soft tissue mass or fluid collection. Mild emphysematous  change in the right lung. IMPRESSION: 1. Large destructive lytic lesion involving the glenoid and coracoid. Smaller lesion just slightly inferior involving the infraglenoid tubercle appears separate from the larger lesion. This would suggest metastatic disease as the underlying etiology. 2. Extra-osseous extension of the larger lesion anteriorly into the subscapularis fossa and posteriorly into the supraspinatus fossa without a clear fat plane between the lesion and the respective muscles. Electronically Signed   By: Titus Dubin M.D.   On: 01/22/2018 20:47    ____________________________________________   PROCEDURES  Procedure(s) performed:   Procedures  None ____________________________________________   INITIAL IMPRESSION / ASSESSMENT AND PLAN / ED COURSE  Pertinent labs & imaging results that were available during my care of the patient were reviewed by me and considered in my medical decision making (see chart for details).  Patient presents to the emergency department with right shoulder pain seeking better pain control at home.  He is currently following with orthopedic surgery and had a cortisone injection.  I do not find evidence of a septic joint on my evaluation.  I do not have imaging of this shoulder and plan to obtain plain film here.  Symptoms do occasionally radiate to the right lateral neck but the pain is in the shoulder.  I do not feel this  is a cervical radiculopathy presentation. Plan for Flexeril, Toradol, and reassess.   9:01 AM Patient CT scan resulted with lytic lesion concerning for metastatic disease.  Patient also with large, extraosseous lesion as noted above.  Patient's labs show a leukocytosis which I suspect is related to acute pain and recent steroid use.  Patient has no concern clinically for septic joint.  Patient also found to have hypokalemia.  He is known about this in the past and is supposed to be on medication.  Patient's pain is well controlled at  this time.  I had a Ian Roberts discussion with him regarding his CT findings.  I did offer admission for pain control and potassium replacement but states he is feeling better after morphine I would like to try oral morphine and oral potassium supplementation at home.  He understands the need for urgent oncology follow-up and I will provide contact information for the local oncologist to call Monday along with an ambulatory referral placed in Epic. Patient to also f/u with his PCP regarding potassium. EKG reviewed with no acute findings related to hypokalemia.  ____________________________________________  FINAL CLINICAL IMPRESSION(S) / ED DIAGNOSES  Final diagnoses:  Acute pain of right shoulder  Lytic lesion of bone on x-ray    MEDICATIONS GIVEN DURING THIS VISIT:  Medications  cyclobenzaprine (FLEXERIL) tablet 10 mg (10 mg Oral Given 01/22/18 1901)  ketorolac (TORADOL) 30 MG/ML injection 30 mg (30 mg Intramuscular Given 01/22/18 1901)  ondansetron (ZOFRAN) injection 4 mg (4 mg Intravenous Given 01/22/18 2002)  morphine 4 MG/ML injection 4 mg (4 mg Intravenous Given 01/22/18 2002)  potassium chloride SA (K-DUR,KLOR-CON) CR tablet 40 mEq (40 mEq Oral Given 01/22/18 2218)  morphine 4 MG/ML injection 4 mg (4 mg Intravenous Given 01/22/18 2147)     NEW OUTPATIENT MEDICATIONS STARTED DURING THIS VISIT:  Discharge Medication List as of 01/22/2018 10:22 PM    START taking these medications   Details  morphine (MSIR) 30 MG tablet Take 1 tablet (30 mg total) by mouth every 6 (six) hours as needed for severe pain., Starting Sat 01/22/2018, Print    ondansetron (ZOFRAN ODT) 4 MG disintegrating tablet Take 1 tablet (4 mg total) by mouth every 8 (eight) hours as needed for nausea or vomiting., Starting Sat 01/22/2018, Print    potassium chloride SA (K-DUR,KLOR-CON) 20 MEQ tablet Take 2 tablets (40 mEq total) by mouth daily for 5 days., Starting Sat 01/22/2018, Until Thu 01/27/2018, Print        Note:  This  document was prepared using Dragon voice recognition software and may include unintentional dictation errors.  Nanda Quinton, MD Emergency Medicine    Konya Fauble, [facility], MD 01/23/18 [phone]

## 2018-01-22 NOTE — ED Triage Notes (Signed)
Patient c/o right shoulder pain related to torn rotator cuff. Per patient seen at [facility] and diagnosed with the tear, referred to Orthopedic. Patient seen at [facility] yesterday and received cortisone injection. Patient states that he intense pain at 3am.

## 2018-01-22 NOTE — ED Notes (Signed)
Patient transported to X-ray 

## 2018-01-22 NOTE — Discharge Instructions (Signed)
You were seen in the ED today with shoulder pain. Your CT scan shows concern for a lesion in the shoulder that could be cancer related. I have placed a referral to an oncologist but you will need to call on Monday to schedule an appointment. Take the pain medication as prescribed. Return to the ED with any new or worsening symptoms. Your potassium is also low and will need to be corrected. Call your PCP to update them on the CT findings and your low potassium.

## 2018-01-24 ENCOUNTER — Encounter (HOSPITAL_COMMUNITY): Payer: Self-pay | Admitting: *Deleted

## 2018-01-24 ENCOUNTER — Other Ambulatory Visit (HOSPITAL_COMMUNITY): Payer: Self-pay | Admitting: *Deleted

## 2018-01-24 DIAGNOSIS — M899 Disorder of bone, unspecified: Secondary | ICD-10-CM

## 2018-01-24 NOTE — Progress Notes (Signed)
Patient was referred to [place] by the emergency room where he presented for shoulder pain and was found to have bone lesions on exam.    I spoke with Dr. Delton Coombes regarding referral and he wants patient to have CT scan of chest abdomen and pelvis prior to coming for his initial appointment.  I have placed those orders and we will schedule patient as soon as possible.

## 2018-01-24 NOTE — Progress Notes (Signed)
I called patient today to introduce myself and let him know about his referral to our office.  Patient states that he is aware that we would be calling.  I advised him that I am the navigator here at the office and would be a point person for him should he need any guidance.  I advised him that I made an appointment for CT scans this Friday and then he will see [place] next Monday.  Patient states that he understands that he needs to pick up contrast and he is okay with the appointments and times.    His only concern at this time is the pain that he is in.  I advised him that we could not get him any pain medication until he is seen here at the clinic and the physician assesses him.  He verbalizes understanding.    I called Dr. Sherrie Sport, patient's primary care physician, and asked that they refill patient's pain medication to last him until he gets here on Monday. I spoke with Amy and she states that she will ask her physician and return my call.    I will notify patient of the outcomes when I hear back from Hasanaj's office.

## 2018-01-28 ENCOUNTER — Ambulatory Visit (HOSPITAL_COMMUNITY)
Admission: RE | Admit: 2018-01-28 | Discharge: 2018-01-28 | Disposition: A | Discharge status: Home / Self Care | Payer: Medicaid Other | Source: Ambulatory Visit | Attending: Hematology | Admitting: Hematology

## 2018-01-28 ENCOUNTER — Encounter (HOSPITAL_COMMUNITY): Payer: Self-pay

## 2018-01-28 DIAGNOSIS — R590 Localized enlarged lymph nodes: Secondary | ICD-10-CM | POA: Diagnosis not present

## 2018-01-28 DIAGNOSIS — J439 Emphysema, unspecified: Secondary | ICD-10-CM | POA: Insufficient documentation

## 2018-01-28 DIAGNOSIS — M899 Disorder of bone, unspecified: Secondary | ICD-10-CM | POA: Diagnosis present

## 2018-01-28 DIAGNOSIS — R918 Other nonspecific abnormal finding of lung field: Secondary | ICD-10-CM | POA: Insufficient documentation

## 2018-01-28 MED ORDER — IOPAMIDOL (ISOVUE-300) INJECTION 61%
100.0000 mL | Freq: Once | INTRAVENOUS | Status: AC | PRN
  Administered 2018-01-28: 100 mL via INTRAVENOUS

## 2018-01-31 ENCOUNTER — Inpatient Hospital Stay (HOSPITAL_COMMUNITY): Payer: Medicaid Other | Attending: Hematology | Admitting: Hematology

## 2018-01-31 ENCOUNTER — Encounter (HOSPITAL_COMMUNITY): Payer: Self-pay | Admitting: Hematology

## 2018-01-31 ENCOUNTER — Other Ambulatory Visit: Payer: Self-pay

## 2018-01-31 VITALS — BP 119/75 | HR 18 | Ht 71.0 in | Wt 186.6 lb

## 2018-01-31 DIAGNOSIS — Z79899 Other long term (current) drug therapy: Secondary | ICD-10-CM

## 2018-01-31 DIAGNOSIS — F17210 Nicotine dependence, cigarettes, uncomplicated: Secondary | ICD-10-CM

## 2018-01-31 DIAGNOSIS — I10 Essential (primary) hypertension: Secondary | ICD-10-CM | POA: Diagnosis not present

## 2018-01-31 DIAGNOSIS — C7801 Secondary malignant neoplasm of right lung: Secondary | ICD-10-CM | POA: Diagnosis not present

## 2018-01-31 DIAGNOSIS — Z801 Family history of malignant neoplasm of trachea, bronchus and lung: Secondary | ICD-10-CM | POA: Diagnosis not present

## 2018-01-31 DIAGNOSIS — C3492 Malignant neoplasm of unspecified part of left bronchus or lung: Secondary | ICD-10-CM | POA: Diagnosis present

## 2018-01-31 DIAGNOSIS — G893 Neoplasm related pain (acute) (chronic): Secondary | ICD-10-CM

## 2018-01-31 DIAGNOSIS — Z7982 Long term (current) use of aspirin: Secondary | ICD-10-CM

## 2018-01-31 DIAGNOSIS — C787 Secondary malignant neoplasm of liver and intrahepatic bile duct: Secondary | ICD-10-CM

## 2018-01-31 DIAGNOSIS — C7951 Secondary malignant neoplasm of bone: Secondary | ICD-10-CM | POA: Diagnosis not present

## 2018-01-31 DIAGNOSIS — C3490 Malignant neoplasm of unspecified part of unspecified bronchus or lung: Secondary | ICD-10-CM | POA: Insufficient documentation

## 2018-01-31 MED ORDER — PROCHLORPERAZINE MALEATE 10 MG PO TABS: 10.0000 mg | ORAL_TABLET | Freq: Four times a day (QID) | ORAL | 3 refills | Status: DC | PRN

## 2018-01-31 MED ORDER — MORPHINE SULFATE 30 MG PO TABS: 30.0000 mg | ORAL_TABLET | Freq: Four times a day (QID) | ORAL | 0 refills | Status: DC | PRN

## 2018-01-31 NOTE — Assessment & Plan Note (Addendum)
1.  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 - 60-pack-year smoker. -CT scan of the shoulder shows large destructive lytic lesion involving the glenoid and the coracoid. - CT CAP on 01/28/2018 shows 5.1 x 3.6 cm left lower lobe mass, left hilar and mediastinal lymphadenopathy, 13 mm low-attenuation lesion in the liver, large destructive bone lesion involving the right scapula, no evidence of other bone lesions, bilateral iliac and inguinal lymphadenopathy. -I have recommended MRI of the brain with and without contrast to complete the work-up. - I will schedule a CT-guided biopsy of the right shoulder mass. 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  He does have some draining areas in the groin region.  This is likely contributing to his adenopathy.  Hence I did not recommend any biopsy of the lymph node.  However I will send him to Dr. Arnoldo Morale for port placement. -He was encouraged to quit smoking.  2.  Right shoulder pain: - Described as sharp and constant.  10 out of 10 in intensity. - Tried taking Percocet which did not help. - Currently taking morphine 30 mg every 6 hours.  This helps but causes nausea. -I will send a prescription for Compazine.  I have refilled his pain medicine.

## 2018-01-31 NOTE — Progress Notes (Signed)
[facility] CONSULT NOTE  Patient Care Team: Neale Burly, MD as PCP - General (Internal Medicine)  CHIEF COMPLAINTS/PURPOSE OF CONSULTATION:  Highly likely metastatic lung cancer.  HISTORY OF PRESENTING ILLNESS:  Ian Roberts 57 y.o. male is seen in consultation today for further work-up and management of metastatic lung cancer.  He went to the ER on 01/22/2018 with right shoulder pain which is present for 1 month.  An x-ray revealed lytic lesion.  A CT scan of the shoulder was done which showed large destructive lytic lesion involving the glenoid and the coracoid.  Smaller lesion just slightly inferior involving the infra glenoid tubercle appears separate from the larger lesion.  He is a 60-pack-year current active smoker.  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  Denies any significant weight loss.  Pain is in the right shoulder described as sharp and constant, 10/10.  He tried taking oxycodone which did not help.  He is currently taking morphine 30 mg every 6 hours which is helping.  However he gets nauseated from it.  He reportedly had necrotizing perirectal abscess a year ago which was surgically treated at [facility].  He does report some drainage areas in his right groin.  He had an MI in 2010 and a CVA in 2017.  Today he is accompanied by his wife.  His father reportedly died of lung cancer and was a smoker.  MEDICAL HISTORY:  Past Medical History:  Diagnosis Date  . Bone lesion    right shoulder found on x-ray  . CAD (coronary artery disease)   . Dyslipidemia   . Hypertension   . Hypokalemia   . Necrotizing fasciitis ([place])   . Rotator cuff tear   . ST elevation myocardial infarction (STEMI) of inferior wall (HCC)    Assocaited with paroxysmal atrial fibrillation 05/13/2008; Xience drug eluting stent  . Stroke [facility])     SURGICAL HISTORY: Past Surgical History:  Procedure Laterality Date  . CORONARY ANGIOPLASTY WITH STENT  PLACEMENT    . CYST EXCISION    . TONSILLECTOMY      SOCIAL HISTORY: Social History   Socioeconomic History  . Marital status: Married    Spouse name: Not on file  . Number of children: 2  . Years of education: Not on file  . Highest education level: Not on file  Occupational History  . Occupation: Full time Dealer    Comment: unemployed  Social Needs  . Financial resource strain: Not very hard  . Food insecurity:    Worry: Never true    Inability: Never true  . Transportation needs:    Medical: No    Non-medical: No  Tobacco Use  . Smoking status: Current Every Day Smoker    Packs/day: 1.50    Years: 41.00    Pack years: 61.50    Types: Cigarettes  . Smokeless tobacco: Never Used  Substance and Sexual Activity  . Alcohol use: Never    Frequency: Never  . Drug use: Never  . Sexual activity: Not Currently  Lifestyle  . Physical activity:    Days per week: 0 days    Minutes per session: 0 min  . Stress: To some extent  Relationships  . Social connections:    Talks on phone: More than three times a week    Gets together: More than three times a week    Attends religious service: Never    Active member of club or  organization: No    Attends meetings of clubs or organizations: Never    Relationship status: Married  . Intimate partner violence:    Fear of current or ex partner: No    Emotionally abused: No    Physically abused: No    Forced sexual activity: No  Other Topics Concern  . Not on file  Social History Narrative   Married    FAMILY HISTORY: Family History  Problem Relation Age of Onset  . Diabetes Mother   . Hypertension Mother   . Lung cancer Father     ALLERGIES:  is allergic to hydrocodone.  MEDICATIONS:  Current Outpatient Medications  Medication Sig Dispense Refill  . morphine (MSIR) 30 MG tablet Take 1 tablet (30 mg total) by mouth every 6 (six) hours as needed for severe pain. 60 tablet 0  . ondansetron (ZOFRAN ODT) 4 MG  disintegrating tablet Take 1 tablet (4 mg total) by mouth every 8 (eight) hours as needed for nausea or vomiting. 20 tablet 0  . amLODipine (NORVASC) 10 MG tablet Take 1 tablet by mouth daily.    Marland Kitchen aspirin EC 325 MG tablet Take 1 tablet by mouth daily.    Marland Kitchen docusate sodium (COLACE) 100 MG capsule Take 1 capsule by mouth 2 (two) times daily.    . hydrochlorothiazide (HYDRODIURIL) 25 MG tablet Take 1 tablet by mouth daily.    Marland Kitchen ibuprofen (ADVIL,MOTRIN) 800 MG tablet Take 800 mg by mouth 3 (three) times daily.  2  . lisinopril (PRINIVIL,ZESTRIL) 20 MG tablet Take 20 mg by mouth daily.    . prochlorperazine (COMPAZINE) 10 MG tablet Take 1 tablet (10 mg total) by mouth every 6 (six) hours as needed for nausea or vomiting. 60 tablet 3  . simvastatin (ZOCOR) 20 MG tablet Take 1 tablet by mouth daily.    Marland Kitchen sulfamethoxazole-trimethoprim (BACTRIM,SEPTRA) 400-80 MG tablet Take 1 tablet by mouth 2 (two) times daily. Starting 01/21/2018 for 10 days  0   No current facility-administered medications for this visit.     REVIEW OF SYSTEMS:   Constitutional: Denies fevers, chills or abnormal night sweats Eyes: Denies blurriness of vision, double vision or watery eyes Ears, nose, mouth, throat, and face: Denies mucositis or sore throat Respiratory: Denies cough, dyspnea or wheezes Cardiovascular: Denies palpitation, chest discomfort or lower extremity swelling Gastrointestinal: Denies any change in bowel habits.  Positive for nausea when he takes pain medicine. Skin: Denies abnormal skin rashes Lymphatics: Denies new lymphadenopathy or easy bruising Neurological:Denies numbness, tingling or new weaknesses Behavioral/Psych: Mood is stable, no new changes  All other systems were reviewed with the patient and are negative.  PHYSICAL EXAMINATION: ECOG PERFORMANCE STATUS: 1 - Symptomatic but completely ambulatory  I have reviewed his vitals.  Blood pressure is 119/75 pulse rate is 92 respiratory is 18.   Saturations are 99%. GENERAL:alert, no distress and comfortable SKIN: There is a small drainage area in his right groin. EYES: normal, conjunctiva are pink and non-injected, sclera clear OROPHARYNX:no exudate, no erythema and lips, buccal mucosa, and tongue normal  NECK: supple, thyroid normal size, non-tender, without nodularity LYMPH: Bilateral inguinal adenopathy, more prominent on the left side palpable.  No other palpable lymph nodes. LUNGS: clear to auscultation and percussion with normal breathing effort HEART: regular rate & rhythm and no murmurs and no lower extremity edema ABDOMEN:abdomen soft, non-tender and normal bowel sounds Musculoskeletal:no cyanosis of digits and no clubbing  PSYCH: alert & oriented x 3 with fluent speech NEURO: no focal  motor/sensory deficits  LABORATORY DATA:  I have reviewed the data as listed Lab Results  Component Value Date   WBC 17.9 (H) 01/22/2018   HGB 12.9 (L) 01/22/2018   HCT 37.9 (L) 01/22/2018   MCV 84.6 01/22/2018   PLT 370 01/22/2018     Chemistry      Component Value Date/Time   NA 135 01/22/2018 2004   K 2.7 (LL) 01/22/2018 2004   CL 105 01/22/2018 2004   CO2 20 (L) 01/22/2018 2004   BUN 15 01/22/2018 2004   CREATININE 0.88 01/22/2018 2004      Component Value Date/Time   CALCIUM 9.4 01/22/2018 2004       RADIOGRAPHIC STUDIES: I have personally reviewed the radiological images as listed and agreed with the findings in the report. Ct Abdomen Pelvis W Wo Contrast  Result Date: 01/29/2018 CLINICAL DATA:  Followup recent CT scan of the shoulder showing destructive bone lesions consistent with metastatic disease. EXAM: CT CHEST, ABDOMEN, AND PELVIS WITH CONTRAST TECHNIQUE: Multidetector CT imaging of the chest, abdomen and pelvis was performed following the standard protocol during bolus administration of intravenous contrast. CONTRAST:  186mL ISOVUE-300 IOPAMIDOL (ISOVUE-300) INJECTION 61% COMPARISON:  CT shoulder 01/22/2018.   CT abdomen/pelvis 02/09/2014 FINDINGS: CT CHEST FINDINGS Cardiovascular: The heart is normal in size. No pericardial effusion. The aorta is normal in caliber. No dissection. Scattered atherosclerotic calcifications. The branch vessels are patent. Scattered coronary artery calcifications. Mediastinum/Nodes: Mediastinal and left hilar lymphadenopathy. Anterior mediastinal lymph node on image number 28 measures 10.5 mm. Subcarinal lymph node on image number 29 measures 14.5 mm. 3.2 x 1.2 cm left hilar nodal mass on image number 28. AP window node on image number 24 measures 13 mm. The esophagus is grossly normal. Lungs/Pleura: 5.1 x 3.6 cm left lower lobe mass surrounding the left lower lobe bronchi. There is tumor surrounding and involving the bronchi down toward the peripheral the long consistent with endobronchial spread. There is a peripheral satellite lesion measuring 2 cm on image number 86. No definite right-sided pulmonary nodules.  No pleural effusions. Musculoskeletal: As demonstrated on the recent shoulder CT scan there is a large destructive lesion involving the right scapula, most notably the glenoid and coracoid process. I do not however see any other definite destructive bone lesions. CT ABDOMEN PELVIS FINDINGS Hepatobiliary: Low-attenuation lesion in segment 6 of the liver measuring 13 mm is suspicious for a metastatic focus. I do not see any other definite hepatic lesions. No intrahepatic biliary dilatation. The gallbladder is normal. Pancreas: No mass, inflammation or ductal dilatation. Spleen: Normal size.  No focal lesions. Adrenals/Urinary Tract: The adrenal glands and kidneys are unremarkable. No worrisome renal lesions simple appearing lower pole right renal cyst is noted. The bladder appears normal. Stomach/Bowel: The stomach, duodenum, small bowel and colon are unremarkable. No acute inflammatory changes, mass lesions or obstructive findings. The terminal ileum and appendix are normal.  Vascular/Lymphatic: Moderate atherosclerotic calcifications involving the aorta and iliac arteries. No aneurysm or dissection. The branch vessels are patent. The major venous structures are patent. No mesenteric or retroperitoneal mass or lymphadenopathy. There is left-sided external iliac lymphadenopathy. Medial left external iliac node on image number 111 measures 18 mm. There is also a 16 mm node on image number 116. Left inguinal nodal mass measures 3.8 x 1.6 cm on image number 124. There is also an enlarged right external iliac lymph node on image number 106 measuring 9 mm. Right inguinal lymph node measures 22 x 9.5 mm on  image number 123 Reproductive: The prostate gland is mildly enlarged. The seminal vesicles appear normal. Other: No ascites or abdominal wall hernia. Chest wall/musculoskeletal: No axillary or supraclavicular adenopathy is identified. No definite destructive bony lesions involving the lumbar spine or pelvis. Incidental bilateral pars defects noted at L5. IMPRESSION: 1. Large left lower lobe lung mass, likely primary lung neoplasm with evidence of endobronchial spread of tumor, a satellite 2 cm nodule and mediastinal and hilar lymphadenopathy. 2. Underlying emphysematous changes. 3. Large destructive bone lesion involving the right scapula without other definite bone lesions 4. Suspect a single hepatic metastatic lesion in segment 6. 5. No definite adrenal gland metastasis. 6. Bilateral iliac and inguinal lymphadenopathy. Somewhat unusual pattern. It is possible this is metastatic lung cancer. A second primary such as lymphoma or prostate cancer I suppose would be possible. A left inguinal biopsy might be indicated in addition to bronchoscopic biopsy of the left lower lobe lung mass. Electronically Signed   By: Marijo Sanes M.D.   On: 01/29/2018 00:32   Dg Shoulder Right  Result Date: 01/22/2018 CLINICAL DATA:  Right shoulder pain for the past month. EXAM: RIGHT SHOULDER - 2+ VIEW  COMPARISON:  Right shoulder x-rays dated December 26, 2017. FINDINGS: No acute fracture or dislocation. There is increasing lucency and cortical irregularity of the superior glenoid extending into the coracoid with periosteal reaction. Joint spaces are preserved. Soft tissues are unremarkable. IMPRESSION: 1. Increasing lucency and cortical irregularity of the superior glenoid extending into the coracoid, concerning for underlying destructive bony lesion or infection. Recommend CT or MRI of the right shoulder for further evaluation. Electronically Signed   By: Titus Dubin M.D.   On: 01/22/2018 19:42   Ct Chest W Contrast  Result Date: 01/29/2018 CLINICAL DATA:  Followup recent CT scan of the shoulder showing destructive bone lesions consistent with metastatic disease. EXAM: CT CHEST, ABDOMEN, AND PELVIS WITH CONTRAST TECHNIQUE: Multidetector CT imaging of the chest, abdomen and pelvis was performed following the standard protocol during bolus administration of intravenous contrast. CONTRAST:  124mL ISOVUE-300 IOPAMIDOL (ISOVUE-300) INJECTION 61% COMPARISON:  CT shoulder 01/22/2018.  CT abdomen/pelvis 02/09/2014 FINDINGS: CT CHEST FINDINGS Cardiovascular: The heart is normal in size. No pericardial effusion. The aorta is normal in caliber. No dissection. Scattered atherosclerotic calcifications. The branch vessels are patent. Scattered coronary artery calcifications. Mediastinum/Nodes: Mediastinal and left hilar lymphadenopathy. Anterior mediastinal lymph node on image number 28 measures 10.5 mm. Subcarinal lymph node on image number 29 measures 14.5 mm. 3.2 x 1.2 cm left hilar nodal mass on image number 28. AP window node on image number 24 measures 13 mm. The esophagus is grossly normal. Lungs/Pleura: 5.1 x 3.6 cm left lower lobe mass surrounding the left lower lobe bronchi. There is tumor surrounding and involving the bronchi down toward the peripheral the long consistent with endobronchial spread. There is a  peripheral satellite lesion measuring 2 cm on image number 86. No definite right-sided pulmonary nodules.  No pleural effusions. Musculoskeletal: As demonstrated on the recent shoulder CT scan there is a large destructive lesion involving the right scapula, most notably the glenoid and coracoid process. I do not however see any other definite destructive bone lesions. CT ABDOMEN PELVIS FINDINGS Hepatobiliary: Low-attenuation lesion in segment 6 of the liver measuring 13 mm is suspicious for a metastatic focus. I do not see any other definite hepatic lesions. No intrahepatic biliary dilatation. The gallbladder is normal. Pancreas: No mass, inflammation or ductal dilatation. Spleen: Normal size.  No focal lesions.  Adrenals/Urinary Tract: The adrenal glands and kidneys are unremarkable. No worrisome renal lesions simple appearing lower pole right renal cyst is noted. The bladder appears normal. Stomach/Bowel: The stomach, duodenum, small bowel and colon are unremarkable. No acute inflammatory changes, mass lesions or obstructive findings. The terminal ileum and appendix are normal. Vascular/Lymphatic: Moderate atherosclerotic calcifications involving the aorta and iliac arteries. No aneurysm or dissection. The branch vessels are patent. The major venous structures are patent. No mesenteric or retroperitoneal mass or lymphadenopathy. There is left-sided external iliac lymphadenopathy. Medial left external iliac node on image number 111 measures 18 mm. There is also a 16 mm node on image number 116. Left inguinal nodal mass measures 3.8 x 1.6 cm on image number 124. There is also an enlarged right external iliac lymph node on image number 106 measuring 9 mm. Right inguinal lymph node measures 22 x 9.5 mm on image number 123 Reproductive: The prostate gland is mildly enlarged. The seminal vesicles appear normal. Other: No ascites or abdominal wall hernia. Chest wall/musculoskeletal: No axillary or supraclavicular  adenopathy is identified. No definite destructive bony lesions involving the lumbar spine or pelvis. Incidental bilateral pars defects noted at L5. IMPRESSION: 1. Large left lower lobe lung mass, likely primary lung neoplasm with evidence of endobronchial spread of tumor, a satellite 2 cm nodule and mediastinal and hilar lymphadenopathy. 2. Underlying emphysematous changes. 3. Large destructive bone lesion involving the right scapula without other definite bone lesions 4. Suspect a single hepatic metastatic lesion in segment 6. 5. No definite adrenal gland metastasis. 6. Bilateral iliac and inguinal lymphadenopathy. Somewhat unusual pattern. It is possible this is metastatic lung cancer. A second primary such as lymphoma or prostate cancer I suppose would be possible. A left inguinal biopsy might be indicated in addition to bronchoscopic biopsy of the left lower lobe lung mass. Electronically Signed   By: Marijo Sanes M.D.   On: 01/29/2018 00:32   Ct Shoulder Right Wo Contrast  Result Date: 01/22/2018 CLINICAL DATA:  Right shoulder pain for the past month. Possible glenoid destruction seen on x-ray. EXAM: CT OF THE UPPER RIGHT EXTREMITY WITHOUT CONTRAST TECHNIQUE: Multidetector CT imaging of the upper right extremity was performed according to the standard protocol. COMPARISON:  Right shoulder x-rays from same day. FINDINGS: Bones/Joint/Cartilage Large destructive lytic lesion involving the glenoid and coracoid, measuring approximately 5.2 x 4.0 x 4.9 cm. There is extra osseous extension anteriorly deep to the subscapularis muscle and posteriorly into the supraspinatus muscle. There is a smaller 1.3 x 1.6 x 2.1 cm lesion just slightly inferiorly involving the infra glenoid tubercle which appears separate from the larger, more superior lesion. No acute fracture or dislocation. No joint effusion. Mild glenohumeral joint space narrowing. Mild acromioclavicular osteoarthritis. Ligaments Suboptimally assessed by CT.  Muscles and Tendons Involvement of the supraspinatus and subscapularis fossae as above without clear fat plane between the lesion and the respective muscles. No muscle atrophy. Soft tissues No soft tissue mass or fluid collection. Mild emphysematous change in the right lung. IMPRESSION: 1. Large destructive lytic lesion involving the glenoid and coracoid. Smaller lesion just slightly inferior involving the infraglenoid tubercle appears separate from the larger lesion. This would suggest metastatic disease as the underlying etiology. 2. Extra-osseous extension of the larger lesion anteriorly into the subscapularis fossa and posteriorly into the supraspinatus fossa without a clear fat plane between the lesion and the respective muscles. Electronically Signed   By: Titus Dubin M.D.   On: 01/22/2018 20:47  ASSESSMENT & PLAN:  Metastatic lung cancer (metastasis from lung to other site), left ([place]) 1.  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 - 60-pack-year smoker. -CT scan of the shoulder shows large destructive lytic lesion involving the glenoid and the coracoid. - CT CAP on 01/28/2018 shows 5.1 x 3.6 cm left lower lobe mass, left hilar and mediastinal lymphadenopathy, 13 mm low-attenuation lesion in the liver, large destructive bone lesion involving the right scapula, no evidence of other bone lesions, bilateral iliac and inguinal lymphadenopathy. -I have recommended MRI of the brain with and without contrast to complete the work-up. - I will schedule a CT-guided biopsy of the right shoulder mass. - He had a history of necrotizing perirectal abscess for which he was operated a year ago at [facility].  He does have some draining areas in the groin region.  This is likely contributing to his adenopathy.  Hence I did not recommend any biopsy of the lymph node.  However I will send him to Dr. Arnoldo Morale for port  placement. -He was encouraged to quit smoking.  2.  Right shoulder pain: - Described as sharp and constant.  10 out of 10 in intensity. - Tried taking Percocet which did not help. - Currently taking morphine 30 mg every 6 hours.  This helps but causes nausea. -I will send a prescription for Compazine.  I have refilled his pain medicine.  Orders Placed This Encounter  Procedures  . CT Biopsy    Standing Status:   Future    Standing Expiration Date:   01/31/2019    Order Specific Question:   Lab orders requested (DO NOT place separate lab orders, these will be automatically ordered during procedure specimen collection):    Answer:   Surgical Pathology    Order Specific Question:   Reason for Exam (SYMPTOM  OR DIAGNOSIS REQUIRED)    Answer:   right scapular mass, likely lung origin    Order Specific Question:   Preferred imaging location?    Answer:   [facility]    Order Specific Question:   Radiology Contrast Protocol - do NOT remove file path    Answer:   \\[email or web address]  . MR Brain W Wo Contrast    Standing Status:   Future    Standing Expiration Date:   01/31/2019    Order Specific Question:   ** REASON FOR EXAM (FREE TEXT)    Answer:   lung cancer workup    Order Specific Question:   If indicated for the ordered procedure, I authorize the administration of contrast media per Radiology protocol    Answer:   Yes    Order Specific Question:   What is the patient's sedation requirement?    Answer:   No Sedation    Order Specific Question:   Does the patient have a pacemaker or implanted devices?    Answer:   No    Order Specific Question:   Use SRS Protocol?    Answer:   No    Order Specific Question:   Radiology Contrast Protocol - do NOT remove file path    Answer:   \\[email or web address]    Order Specific Question:   Preferred imaging location?    Answer:   [facility] (table limit-350lbs)  . Ambulatory  referral to Social Work    Referral Priority:   Routine    Referral Type:   Consultation  Referral Reason:   Specialty Services Required    Number of Visits Requested:   1    All questions were answered. The patient knows to call the clinic with any problems, questions or concerns.     Derek Jack, MD 01/31/2018 4:05 PM

## 2018-02-01 ENCOUNTER — Encounter (HOSPITAL_COMMUNITY): Payer: Self-pay | Admitting: General Practice

## 2018-02-01 NOTE — Progress Notes (Signed)
[place] Psychosocial Distress Screening Clinical Social Work  Clinical Social Work was referred by distress screening protocol.  The patient scored a 7 on the Psychosocial Distress Thermometer which indicates moderate distress. Clinical Social Worker contacted patient by phone to assess for distress and other psychosocial needs. Spoke w wife, patient asleep, "he's sleeping a lot."  Family is self employed, wife is now Armed forces technical officer both caregiving and business needs.  Uninsured at present, exploring options for insurance coverage.  Has support from immediate family, have not told many others about possible diagnosis of cancer.  Wife coping as best she can w unexpected diagnosis and treatment demands.  Discussed process of applying for social Security disability, will provide additional information w mailed packet of information for family to review.  Encouraged contact w CSW as needed.    ONCBCN DISTRESS SCREENING 01/31/2018  Screening Type Initial Screening  Distress experienced in past week (1-10) 7  Emotional problem type Adjusting to illness;Nervousness/Anxiety  Information Concerns Type Lack of info about diagnosis;Lack of info about treatment;Lack of info about complementary therapy choices  Physical Problem type Pain;Nausea/vomiting;Bathing/dressing;Loss of appetitie;Constipation/diarrhea    Clinical Social Worker follow up needed: No.  If yes, follow up plan:  Beverely Pace, [place], LCSW Clinical Social Worker Phone:  [phone]

## 2018-02-03 ENCOUNTER — Encounter: Payer: Self-pay | Admitting: General Surgery

## 2018-02-03 ENCOUNTER — Ambulatory Visit (HOSPITAL_COMMUNITY)
Admission: RE | Admit: 2018-02-03 | Discharge: 2018-02-03 | Disposition: A | Discharge status: Home / Self Care | Payer: Medicaid Other | Source: Ambulatory Visit | Attending: Hematology | Admitting: Hematology

## 2018-02-03 ENCOUNTER — Ambulatory Visit (INDEPENDENT_AMBULATORY_CARE_PROVIDER_SITE_OTHER): Payer: Self-pay | Admitting: General Surgery

## 2018-02-03 VITALS — BP 131/82 | HR 83 | Temp 98.2°F | Resp 18 | Wt 185.4 lb

## 2018-02-03 DIAGNOSIS — C7931 Secondary malignant neoplasm of brain: Secondary | ICD-10-CM | POA: Diagnosis not present

## 2018-02-03 DIAGNOSIS — C3492 Malignant neoplasm of unspecified part of left bronchus or lung: Secondary | ICD-10-CM | POA: Insufficient documentation

## 2018-02-03 MED ORDER — GADOBUTROL 1 MMOL/ML IV SOLN
10.0000 mL | Freq: Once | INTRAVENOUS | Status: AC | PRN
  Administered 2018-02-03: 10 mL via INTRAVENOUS

## 2018-02-03 NOTE — Progress Notes (Signed)
Ian Roberts; [id number]; 1960/06/16   HPI Patient is a 57 year old white male who was referred to my care by Dr. Delton Coombes for Port-A-Cath insertion.  He has metastatic left lung carcinoma and needs central venous access.  He will be starting his chemotherapy later this month.  He currently has 8 out of 10 pain due to right shoulder disease. Past Medical History:  Diagnosis Date  . Bone lesion    right shoulder found on x-ray  . CAD (coronary artery disease)   . Dyslipidemia   . Hypertension   . Hypokalemia   . Necrotizing fasciitis ([place])   . Rotator cuff tear   . ST elevation myocardial infarction (STEMI) of inferior wall ([facility])    Assocaited with paroxysmal atrial fibrillation 05/13/2008; Xience drug eluting stent  . Stroke [facility])     Past Surgical History:  Procedure Laterality Date  . CORONARY ANGIOPLASTY WITH STENT PLACEMENT    . CYST EXCISION    . TONSILLECTOMY      Family History  Problem Relation Age of Onset  . Diabetes Mother   . Hypertension Mother   . Lung cancer Father     Current Outpatient Medications on File Prior to Visit  Medication Sig Dispense Refill  . amLODipine (NORVASC) 10 MG tablet Take 10 mg by mouth daily.     Marland Kitchen aspirin EC 325 MG tablet Take 325 mg by mouth daily.     Marland Kitchen docusate sodium (COLACE) 100 MG capsule Take 100 mg by mouth daily.     . hydrochlorothiazide (HYDRODIURIL) 25 MG tablet Take 25 mg by mouth daily.     Marland Kitchen ibuprofen (ADVIL,MOTRIN) 200 MG tablet Take 600 mg by mouth every 6 (six) hours as needed for headache or moderate pain.   2  . lisinopril (PRINIVIL,ZESTRIL) 20 MG tablet Take 20 mg by mouth daily.    Marland Kitchen morphine (MSIR) 30 MG tablet Take 1 tablet (30 mg total) by mouth every 6 (six) hours as needed for severe pain. 60 tablet 0  . ondansetron (ZOFRAN ODT) 4 MG disintegrating tablet Take 1 tablet (4 mg total) by mouth every 8 (eight) hours as needed for nausea or vomiting. (Patient not taking: Reported on 02/01/2018) 20 tablet 0  .  prochlorperazine (COMPAZINE) 10 MG tablet Take 1 tablet (10 mg total) by mouth every 6 (six) hours as needed for nausea or vomiting. 60 tablet 3  . simvastatin (ZOCOR) 20 MG tablet Take 20 mg by mouth daily.      No current facility-administered medications on file prior to visit.     Allergies  Allergen Reactions  . Hydrocodone Hives    Social History   Substance and Sexual Activity  Alcohol Use Never  . Frequency: Never    Social History   Tobacco Use  Smoking Status Current Every Day Smoker  . Packs/day: 1.50  . Years: 41.00  . Pack years: 61.50  . Types: Cigarettes  Smokeless Tobacco Never Used    Review of Systems  Constitutional: Negative.   HENT: Negative.   Eyes: Negative.   Respiratory: Negative.   Cardiovascular: Negative.   Gastrointestinal: Positive for heartburn and nausea.  Genitourinary: Negative.   Musculoskeletal: Positive for joint pain.  Skin: Negative.   Neurological: Negative.   Endo/Heme/Allergies: Negative.   Psychiatric/Behavioral: Negative.     Objective   Vitals:   02/03/18 0928  BP: 131/82  Pulse: 83  Resp: 18  Temp: 98.2 F (36.8 C)    Physical Exam  Constitutional: He  is oriented to person, place, and time. He appears well-developed and well-nourished. No distress.  HENT:  Head: Normocephalic and atraumatic.  Cardiovascular: Normal rate, regular rhythm and normal heart sounds. Exam reveals no gallop and no friction rub.  No murmur heard. Pulmonary/Chest: Effort normal and breath sounds normal. No stridor. No respiratory distress. He has no wheezes. He has no rales.  Neurological: He is alert and oriented to person, place, and time.  Skin: Skin is warm and dry.  Vitals reviewed. Dr. Tomie China notes reviewed  Assessment  Left lung carcinoma, metastatic.  Need for central venous access Plan   Patient is scheduled for Port-A-Cath insertion on 02/09/2018.  The risks and benefits of the procedure including bleeding,  infection, and pneumothorax were fully explained to the patient, gave informed consent.  I will place it on the left side due to right shoulder pain and issues.

## 2018-02-03 NOTE — H&P (Signed)
Ian Roberts; [id number]; 1961-02-28   HPI Patient is a 57 year old white male who was referred to my care by Dr. Delton Coombes for Port-A-Cath insertion.  He has metastatic left lung carcinoma and needs central venous access.  He will be starting his chemotherapy later this month.  He currently has 8 out of 10 pain due to right shoulder disease. Past Medical History:  Diagnosis Date  . Bone lesion    right shoulder found on x-ray  . CAD (coronary artery disease)   . Dyslipidemia   . Hypertension   . Hypokalemia   . Necrotizing fasciitis ([place])   . Rotator cuff tear   . ST elevation myocardial infarction (STEMI) of inferior wall (HCC)    Assocaited with paroxysmal atrial fibrillation 05/13/2008; Xience drug eluting stent  . Stroke [facility])     Past Surgical History:  Procedure Laterality Date  . CORONARY ANGIOPLASTY WITH STENT PLACEMENT    . CYST EXCISION    . TONSILLECTOMY      Family History  Problem Relation Age of Onset  . Diabetes Mother   . Hypertension Mother   . Lung cancer Father     Current Outpatient Medications on File Prior to Visit  Medication Sig Dispense Refill  . amLODipine (NORVASC) 10 MG tablet Take 10 mg by mouth daily.     Marland Kitchen aspirin EC 325 MG tablet Take 325 mg by mouth daily.     Marland Kitchen docusate sodium (COLACE) 100 MG capsule Take 100 mg by mouth daily.     . hydrochlorothiazide (HYDRODIURIL) 25 MG tablet Take 25 mg by mouth daily.     Marland Kitchen ibuprofen (ADVIL,MOTRIN) 200 MG tablet Take 600 mg by mouth every 6 (six) hours as needed for headache or moderate pain.   2  . lisinopril (PRINIVIL,ZESTRIL) 20 MG tablet Take 20 mg by mouth daily.    Marland Kitchen morphine (MSIR) 30 MG tablet Take 1 tablet (30 mg total) by mouth every 6 (six) hours as needed for severe pain. 60 tablet 0  . ondansetron (ZOFRAN ODT) 4 MG disintegrating tablet Take 1 tablet (4 mg total) by mouth every 8 (eight) hours as needed for nausea or vomiting. (Patient not taking: Reported on 02/01/2018) 20 tablet 0  .  prochlorperazine (COMPAZINE) 10 MG tablet Take 1 tablet (10 mg total) by mouth every 6 (six) hours as needed for nausea or vomiting. 60 tablet 3  . simvastatin (ZOCOR) 20 MG tablet Take 20 mg by mouth daily.      No current facility-administered medications on file prior to visit.     Allergies  Allergen Reactions  . Hydrocodone Hives    Social History   Substance and Sexual Activity  Alcohol Use Never  . Frequency: Never    Social History   Tobacco Use  Smoking Status Current Every Day Smoker  . Packs/day: 1.50  . Years: 41.00  . Pack years: 61.50  . Types: Cigarettes  Smokeless Tobacco Never Used    Review of Systems  Constitutional: Negative.   HENT: Negative.   Eyes: Negative.   Respiratory: Negative.   Cardiovascular: Negative.   Gastrointestinal: Positive for heartburn and nausea.  Genitourinary: Negative.   Musculoskeletal: Positive for joint pain.  Skin: Negative.   Neurological: Negative.   Endo/Heme/Allergies: Negative.   Psychiatric/Behavioral: Negative.     Objective   Vitals:   02/03/18 0928  BP: 131/82  Pulse: 83  Resp: 18  Temp: 98.2 F (36.8 C)    Physical Exam  Constitutional: He  is oriented to person, place, and time. He appears well-developed and well-nourished. No distress.  HENT:  Head: Normocephalic and atraumatic.  Cardiovascular: Normal rate, regular rhythm and normal heart sounds. Exam reveals no gallop and no friction rub.  No murmur heard. Pulmonary/Chest: Effort normal and breath sounds normal. No stridor. No respiratory distress. He has no wheezes. He has no rales.  Neurological: He is alert and oriented to person, place, and time.  Skin: Skin is warm and dry.  Vitals reviewed. Dr. Tomie China notes reviewed  Assessment  Left lung carcinoma, metastatic.  Need for central venous access Plan   Patient is scheduled for Port-A-Cath insertion on 02/09/2018.  The risks and benefits of the procedure including bleeding,  infection, and pneumothorax were fully explained to the patient, gave informed consent.  I will place it on the left side due to right shoulder pain and issues.

## 2018-02-03 NOTE — Patient Instructions (Signed)
Implanted Port Insertion  Implanted port insertion is a procedure to put in a port and catheter. The port is a device with an injectable disk that can be accessed by your health care provider. The port is connected to a vein in the chest or neck by a small flexible tube (catheter). There are different types of ports. The implanted port may be used as a long-term IV access for:  · Medicines, such as chemotherapy.  · Fluids.  · Liquid nutrition, such as total parenteral nutrition (TPN).  · Blood samples.    Having a port means that your health care provider will not need to use the veins in your arms for these procedures.  Tell a health care provider about:  · Any allergies you have.  · All medicines you are taking, especially blood thinners, as well as any vitamins, herbs, eye drops, creams, over-the-counter medicines, and steroids.  · Any problems you or family members have had with anesthetic medicines.  · Any blood disorders you have.  · Any surgeries you have had.  · Any medical conditions you have, including diabetes or kidney problems.  · Whether you are pregnant or may be pregnant.  What are the risks?  Generally, this is a safe procedure. However, problems may occur, including:  · Allergic reactions to medicines or dyes.  · Damage to other structures or organs.  · Infection.  · Damage to the blood vessel, bruising, or bleeding at the puncture site.  · Blood clot.  · Breakdown of the skin over the port.  · A collection of air in the chest that can cause one of the lungs to collapse (pneumothorax). This is rare.    What happens before the procedure?  Staying hydrated  Follow instructions from your health care provider about hydration, which may include:  · Up to 2 hours before the procedure - you may continue to drink clear liquids, such as water, clear fruit juice, black coffee, and plain tea.    Eating and drinking restrictions  · Follow instructions from your health care provider about eating and drinking,  which may include:  ? 8 hours before the procedure - stop eating heavy meals or foods such as meat, fried foods, or fatty foods.  ? 6 hours before the procedure - stop eating light meals or foods, such as toast or cereal.  ? 6 hours before the procedure - stop drinking milk or drinks that contain milk.  ? 2 hours before the procedure - stop drinking clear liquids.  Medicines  · Ask your health care provider about:  ? Changing or stopping your regular medicines. This is especially important if you are taking diabetes medicines or blood thinners.  ? Taking medicines such as aspirin and ibuprofen. These medicines can thin your blood. Do not take these medicines before your procedure if your health care provider instructs you not to.  · You may be given antibiotic medicine to help prevent infection.  General instructions  · Plan to have someone take you home from the hospital or clinic.  · If you will be going home right after the procedure, plan to have someone with you for 24 hours.  · You may have blood tests.  · You may be asked to shower with a germ-killing soap.  What happens during the procedure?  · To lower your risk of infection:  ? Your health care team will wash or sanitize their hands.  ? Your skin will be washed with   soap.  ? Hair may be removed from the surgical area.  · An IV tube will be inserted into one of your veins.  · You will be given one or more of the following:  ? A medicine to help you relax (sedative).  ? A medicine to numb the area (local anesthetic).  · Two small cuts (incisions) will be made to insert the port.  ? One incision will be made in your neck to get access to the vein where the catheter will lie.  ? The other incision will be made in the upper chest. This is where the port will lie.  · The procedure may be done using continuous X-ray (fluoroscopy) or other imaging tools for guidance.  · The port and catheter will be placed. There may be a small, raised area where the port  is.  · The port will be flushed with a salt solution (saline), and blood will be drawn to make sure that it is working correctly.  · The incisions will be closed.  · Bandages (dressings) may be placed over the incisions.  The procedure may vary among health care providers and hospitals.  What happens after the procedure?  · Your blood pressure, heart rate, breathing rate, and blood oxygen level will be monitored until the medicines you were given have worn off.  · Do not drive for 24 hours if you were given a sedative.  · You will be given a manufacturer's information card for the type of port that you have. Keep this with you.  · Your port will need to be flushed and checked as told by your health care provider, usually every few weeks.  · A chest X-ray will be done to:  ? Check the placement of the port.  ? Make sure there is no injury to your lung.  Summary  · Implanted port insertion is a procedure to put in a port and catheter.  · The implanted port is used as a long-term IV access.  · The port will need to be flushed and checked as told by your health care provider, usually every few weeks.  · Keep your manufacturer's information card with you at all times.  This information is not intended to replace advice given to you by your health care provider. Make sure you discuss any questions you have with your health care provider.  Document Released: 12/28/2012 Document Revised: 01/29/2016 Document Reviewed: 01/29/2016  Elsevier Interactive Patient Education © 2017 [employer]

## 2018-02-04 ENCOUNTER — Encounter (HOSPITAL_COMMUNITY): Payer: Self-pay | Admitting: Lab

## 2018-02-04 NOTE — Progress Notes (Unsigned)
Referral sent to [facility].  Records faxed on 11/15

## 2018-02-07 ENCOUNTER — Other Ambulatory Visit: Payer: Self-pay | Admitting: Student

## 2018-02-07 ENCOUNTER — Other Ambulatory Visit: Payer: Self-pay | Admitting: Radiology

## 2018-02-07 ENCOUNTER — Encounter (HOSPITAL_COMMUNITY)
Admission: RE | Admit: 2018-02-07 | Discharge: 2018-02-07 | Disposition: A | Discharge status: Home / Self Care | Payer: Self-pay | Source: Ambulatory Visit | Attending: General Surgery | Admitting: General Surgery

## 2018-02-08 ENCOUNTER — Encounter (HOSPITAL_COMMUNITY): Payer: Self-pay

## 2018-02-08 ENCOUNTER — Ambulatory Visit (HOSPITAL_COMMUNITY)
Admission: RE | Admit: 2018-02-08 | Discharge: 2018-02-08 | Disposition: A | Discharge status: Home / Self Care | Payer: Medicaid Other | Source: Ambulatory Visit | Attending: Hematology | Admitting: Hematology

## 2018-02-08 ENCOUNTER — Other Ambulatory Visit: Payer: Self-pay

## 2018-02-08 ENCOUNTER — Other Ambulatory Visit: Payer: Self-pay | Admitting: General Surgery

## 2018-02-08 DIAGNOSIS — I10 Essential (primary) hypertension: Secondary | ICD-10-CM | POA: Insufficient documentation

## 2018-02-08 DIAGNOSIS — I2510 Atherosclerotic heart disease of native coronary artery without angina pectoris: Secondary | ICD-10-CM | POA: Insufficient documentation

## 2018-02-08 DIAGNOSIS — Z79899 Other long term (current) drug therapy: Secondary | ICD-10-CM | POA: Diagnosis not present

## 2018-02-08 DIAGNOSIS — C3492 Malignant neoplasm of unspecified part of left bronchus or lung: Secondary | ICD-10-CM | POA: Diagnosis not present

## 2018-02-08 DIAGNOSIS — K769 Liver disease, unspecified: Secondary | ICD-10-CM | POA: Insufficient documentation

## 2018-02-08 DIAGNOSIS — Z7982 Long term (current) use of aspirin: Secondary | ICD-10-CM | POA: Insufficient documentation

## 2018-02-08 DIAGNOSIS — Z801 Family history of malignant neoplasm of trachea, bronchus and lung: Secondary | ICD-10-CM | POA: Diagnosis not present

## 2018-02-08 DIAGNOSIS — I252 Old myocardial infarction: Secondary | ICD-10-CM | POA: Insufficient documentation

## 2018-02-08 DIAGNOSIS — Z8249 Family history of ischemic heart disease and other diseases of the circulatory system: Secondary | ICD-10-CM | POA: Insufficient documentation

## 2018-02-08 DIAGNOSIS — Z885 Allergy status to narcotic agent status: Secondary | ICD-10-CM | POA: Diagnosis not present

## 2018-02-08 DIAGNOSIS — F17210 Nicotine dependence, cigarettes, uncomplicated: Secondary | ICD-10-CM | POA: Diagnosis not present

## 2018-02-08 DIAGNOSIS — D480 Neoplasm of uncertain behavior of bone and articular cartilage: Secondary | ICD-10-CM | POA: Insufficient documentation

## 2018-02-08 DIAGNOSIS — Z8673 Personal history of transient ischemic attack (TIA), and cerebral infarction without residual deficits: Secondary | ICD-10-CM | POA: Diagnosis not present

## 2018-02-08 DIAGNOSIS — Z955 Presence of coronary angioplasty implant and graft: Secondary | ICD-10-CM | POA: Diagnosis not present

## 2018-02-08 DIAGNOSIS — E785 Hyperlipidemia, unspecified: Secondary | ICD-10-CM | POA: Insufficient documentation

## 2018-02-08 DIAGNOSIS — R590 Localized enlarged lymph nodes: Secondary | ICD-10-CM | POA: Diagnosis not present

## 2018-02-08 LAB — CBC
HCT: 39.3 % (ref 39.0–52.0)
Hemoglobin: 12.5 g/dL — ABNORMAL LOW (ref 13.0–17.0)
MCH: 28 pg (ref 26.0–34.0)
MCHC: 31.8 g/dL (ref 30.0–36.0)
MCV: 87.9 fL (ref 80.0–100.0)
PLATELETS: 481 10*3/uL — AB (ref 150–400)
RBC: 4.47 MIL/uL (ref 4.22–5.81)
RDW: 13.8 % (ref 11.5–15.5)
WBC: 13.7 10*3/uL — ABNORMAL HIGH (ref 4.0–10.5)
nRBC: 0 % (ref 0.0–0.2)

## 2018-02-08 LAB — PROTIME-INR
INR: 1.15
Prothrombin Time: 14.6 seconds (ref 11.4–15.2)

## 2018-02-08 MED ORDER — HYDROMORPHONE HCL 1 MG/ML IJ SOLN: 0.5000 mg | INTRAMUSCULAR | Status: DC | PRN

## 2018-02-08 MED ORDER — LIDOCAINE HCL 1 % IJ SOLN
INTRAMUSCULAR | Status: AC
  Filled 2018-02-08: qty 20

## 2018-02-08 MED ORDER — FENTANYL CITRATE (PF) 100 MCG/2ML IJ SOLN
INTRAMUSCULAR | Status: AC
  Filled 2018-02-08: qty 2

## 2018-02-08 MED ORDER — FENTANYL CITRATE (PF) 100 MCG/2ML IJ SOLN
INTRAMUSCULAR | Status: AC | PRN
  Administered 2018-02-08: 25 ug via INTRAVENOUS
  Administered 2018-02-08: 50 ug via INTRAVENOUS

## 2018-02-08 MED ORDER — MIDAZOLAM HCL 2 MG/2ML IJ SOLN
INTRAMUSCULAR | Status: AC
  Filled 2018-02-08: qty 2

## 2018-02-08 MED ORDER — POTASSIUM CHLORIDE ER 20 MEQ PO TBCR: 20.0000 meq | EXTENDED_RELEASE_TABLET | Freq: Three times a day (TID) | ORAL | 0 refills | Status: DC

## 2018-02-08 MED ORDER — ONDANSETRON HCL 4 MG/2ML IJ SOLN
INTRAMUSCULAR | Status: AC
  Filled 2018-02-08: qty 2

## 2018-02-08 MED ORDER — SODIUM CHLORIDE 0.9 % IV SOLN
INTRAVENOUS | Status: DC
  Administered 2018-02-08: 13:00:00 via INTRAVENOUS

## 2018-02-08 MED ORDER — ONDANSETRON HCL 4 MG/2ML IJ SOLN
INTRAMUSCULAR | Status: AC | PRN
  Administered 2018-02-08: 4 mg via INTRAVENOUS

## 2018-02-08 MED ORDER — MIDAZOLAM HCL 2 MG/2ML IJ SOLN
INTRAMUSCULAR | Status: AC | PRN
  Administered 2018-02-08: 1 mg via INTRAVENOUS
  Administered 2018-02-08: 0.5 mg via INTRAVENOUS

## 2018-02-08 NOTE — Progress Notes (Addendum)
After moving pt to the bed from the stretcher, he vomited small amt of green colored liquid material.  States he does this if he takes his meds without eating, and he took morphine without eating this am. Ginger ale given encouraged to sip.

## 2018-02-08 NOTE — Procedures (Signed)
Interventional Radiology Procedure Note  Procedure: CT guided biopsy of right scapular mass  Complications: None  Estimated Blood Loss: None  Findings: 18 G core biopsy x 5 via 17 G needle of destructive right scapular mass.  Venetia Night. Kathlene Cote, M.D Pager:  [phone]

## 2018-02-08 NOTE — H&P (Signed)
Chief Complaint: Patient was seen in consultation today for right scapular lesion biopsy at the request of Katragadda,Sreedhar  Referring Physician(s): [place]  Supervising Physician: Aletta Edouard  Patient Status: [facility] - Out-pt  History of Present Illness: Ian Roberts is a 57 y.o. male   Presented initially with right shoulder pain x 1 mo Imaging revealed large destructive lytic lesion   CT work up 11/8:  IMPRESSION: 1. Large left lower lobe lung mass, likely primary lung neoplasm with evidence of endobronchial spread of tumor, a satellite 2 cm nodule and mediastinal and hilar lymphadenopathy. 2. Underlying emphysematous changes. 3. Large destructive bone lesion involving the right scapula without other definite bone lesions 4. Suspect a single hepatic metastatic lesion in segment 6. 5. No definite adrenal gland metastasis. 6. Bilateral iliac and inguinal lymphadenopathy. Somewhat unusual pattern. It is possible this is metastatic lung cancer. A second primary such as lymphoma or prostate cancer I suppose would be possible. A left inguinal biopsy might be indicated in addition to bronchoscopic biopsy of the left lower lobe lung mass.  Referred to Dr Delton Coombes 01/31/18 ASSESSMENT & PLAN:  Metastatic lung cancer (metastasis from lung to other site), left ([place]) 1.  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 - 60-pack-year smoker. -CT scan of the shoulder shows large destructive lytic lesion involving the glenoid and the coracoid. - CT CAP on 01/28/2018 shows 5.1 x 3.6 cm left lower lobe mass, left hilar and mediastinal lymphadenopathy, 13 mm low-attenuation lesion in the liver, large destructive bone lesion involving the right scapula, no evidence of other bone lesions, bilateral iliac and inguinal lymphadenopathy. -I have recommended MRI of the brain with and without contrast  to complete the work-up. - I will schedule a CT-guided biopsy of the right shoulder mass. - He had a history of necrotizing perirectal abscess for which he was operated a year ago at [facility].  He does have some draining areas in the groin region.  This is likely contributing to his adenopathy.  Hence I did not recommend any biopsy of the lymph node.  However I will send him to Dr. Arnoldo Morale for port placement. -He was encouraged to quit smoking.  MR Brain:  11/14:  IMPRESSION: 1. Greater than 20 metastasis measuring up to 7 mm are present diffusely throughout the brain. There is mild edema associated with the larger lesions. No mass effect or hemorrhage. 2. Stable moderate chronic microvascular ischemic changes and volume loss of the brain.  Scheduled now for right scapular lesion biopsy  Past Medical History:  Diagnosis Date  . Bone lesion    right shoulder found on x-ray  . CAD (coronary artery disease)   . Dyslipidemia   . Hypertension   . Hypokalemia   . Necrotizing fasciitis ([place])   . Rotator cuff tear   . ST elevation myocardial infarction (STEMI) of inferior wall ([facility])    Assocaited with paroxysmal atrial fibrillation 05/13/2008; Xience drug eluting stent  . Stroke [facility])     Past Surgical History:  Procedure Laterality Date  . CORONARY ANGIOPLASTY WITH STENT PLACEMENT    . CYST EXCISION    . TONSILLECTOMY      Allergies: Hydrocodone  Medications: Prior to Admission medications   Medication Sig Start Date End Date Taking? Authorizing Provider  amLODipine (NORVASC) 10 MG tablet Take 10 mg by mouth daily.    Yes [provider]  aspirin EC 325  MG tablet Take 325 mg by mouth daily.    Yes [provider]  docusate sodium (COLACE) 100 MG capsule Take 100 mg by mouth daily.    Yes [provider]  hydrochlorothiazide (HYDRODIURIL) 25 MG tablet Take 25 mg by mouth daily.    Yes [provider]  ibuprofen (ADVIL,MOTRIN) 200  MG tablet Take 600 mg by mouth every 6 (six) hours as needed for headache or moderate pain.  01/21/18  Yes [provider]  lisinopril (PRINIVIL,ZESTRIL) 20 MG tablet Take 20 mg by mouth daily.   Yes [provider]  morphine (MSIR) 30 MG tablet Take 1 tablet (30 mg total) by mouth every 6 (six) hours as needed for severe pain. 01/31/18  Yes Derek Jack, MD  prochlorperazine (COMPAZINE) 10 MG tablet Take 1 tablet (10 mg total) by mouth every 6 (six) hours as needed for nausea or vomiting. 01/31/18  Yes Derek Jack, MD  simvastatin (ZOCOR) 20 MG tablet Take 20 mg by mouth daily.    Yes [provider]  ondansetron (ZOFRAN ODT) 4 MG disintegrating tablet Take 1 tablet (4 mg total) by mouth every 8 (eight) hours as needed for nausea or vomiting. Patient not taking: Reported on 02/01/2018 01/22/18   Long, Wonda Olds, MD     Family History  Problem Relation Age of Onset  . Diabetes Mother   . Hypertension Mother   . Lung cancer Father     Social History   Socioeconomic History  . Marital status: Married    Spouse name: Not on file  . Number of children: 2  . Years of education: Not on file  . Highest education level: Not on file  Occupational History  . Occupation: Full time Dealer    Comment: unemployed  Social Needs  . Financial resource strain: Not very hard  . Food insecurity:    Worry: Never true    Inability: Never true  . Transportation needs:    Medical: No    Non-medical: No  Tobacco Use  . Smoking status: Current Every Day Smoker    Packs/day: 1.50    Years: 41.00    Pack years: 61.50    Types: Cigarettes  . Smokeless tobacco: Never Used  Substance and Sexual Activity  . Alcohol use: Never    Frequency: Never  . Drug use: Never  . Sexual activity: Not Currently  Lifestyle  . Physical activity:    Days per week: 0 days    Minutes per session: 0 min  . Stress: To some extent  Relationships  . Social connections:     Talks on phone: More than three times a week    Gets together: More than three times a week    Attends religious service: Never    Active member of club or organization: No    Attends meetings of clubs or organizations: Never    Relationship status: Married  Other Topics Concern  . Not on file  Social History Narrative   Married    Review of Systems: A 12 point ROS discussed and pertinent positives are indicated in the HPI above.  All other systems are negative.  Review of Systems  Constitutional: Positive for activity change, appetite change and fatigue. Negative for fever and unexpected weight change.  Respiratory: Negative for shortness of breath.   Cardiovascular: Negative for chest pain.  Musculoskeletal: Positive for back pain.  Neurological: Positive for weakness.  Psychiatric/Behavioral: Negative for behavioral problems and confusion.  Vital Signs: Pulse 74   Temp 98.2 F (36.8 C) (Oral)   Ht 5\' 11"  (1.803 m)   Wt 186 lb (84.4 kg)   SpO2 96%   BMI 25.94 kg/m   Physical Exam  Cardiovascular: Normal rate and regular rhythm.  Pulmonary/Chest: Effort normal and breath sounds normal.  Abdominal: Soft. Bowel sounds are normal.  Musculoskeletal:  Severe right shoulder pain   Neurological: He is alert.  Skin: Skin is warm and dry.  Psychiatric: He has a normal mood and affect. His behavior is normal. Judgment and thought content normal.  Consented with wife at bedside  Vitals reviewed.   Imaging: Ct Abdomen Pelvis W Wo Contrast  Result Date: 01/29/2018 CLINICAL DATA:  Followup recent CT scan of the shoulder showing destructive bone lesions consistent with metastatic disease. EXAM: CT CHEST, ABDOMEN, AND PELVIS WITH CONTRAST TECHNIQUE: Multidetector CT imaging of the chest, abdomen and pelvis was performed following the standard protocol during bolus administration of intravenous contrast. CONTRAST:  170mL ISOVUE-300 IOPAMIDOL (ISOVUE-300) INJECTION 61% COMPARISON:   CT shoulder 01/22/2018.  CT abdomen/pelvis 02/09/2014 FINDINGS: CT CHEST FINDINGS Cardiovascular: The heart is normal in size. No pericardial effusion. The aorta is normal in caliber. No dissection. Scattered atherosclerotic calcifications. The branch vessels are patent. Scattered coronary artery calcifications. Mediastinum/Nodes: Mediastinal and left hilar lymphadenopathy. Anterior mediastinal lymph node on image number 28 measures 10.5 mm. Subcarinal lymph node on image number 29 measures 14.5 mm. 3.2 x 1.2 cm left hilar nodal mass on image number 28. AP window node on image number 24 measures 13 mm. The esophagus is grossly normal. Lungs/Pleura: 5.1 x 3.6 cm left lower lobe mass surrounding the left lower lobe bronchi. There is tumor surrounding and involving the bronchi down toward the peripheral the long consistent with endobronchial spread. There is a peripheral satellite lesion measuring 2 cm on image number 86. No definite right-sided pulmonary nodules.  No pleural effusions. Musculoskeletal: As demonstrated on the recent shoulder CT scan there is a large destructive lesion involving the right scapula, most notably the glenoid and coracoid process. I do not however see any other definite destructive bone lesions. CT ABDOMEN PELVIS FINDINGS Hepatobiliary: Low-attenuation lesion in segment 6 of the liver measuring 13 mm is suspicious for a metastatic focus. I do not see any other definite hepatic lesions. No intrahepatic biliary dilatation. The gallbladder is normal. Pancreas: No mass, inflammation or ductal dilatation. Spleen: Normal size.  No focal lesions. Adrenals/Urinary Tract: The adrenal glands and kidneys are unremarkable. No worrisome renal lesions simple appearing lower pole right renal cyst is noted. The bladder appears normal. Stomach/Bowel: The stomach, duodenum, small bowel and colon are unremarkable. No acute inflammatory changes, mass lesions or obstructive findings. The terminal ileum and  appendix are normal. Vascular/Lymphatic: Moderate atherosclerotic calcifications involving the aorta and iliac arteries. No aneurysm or dissection. The branch vessels are patent. The major venous structures are patent. No mesenteric or retroperitoneal mass or lymphadenopathy. There is left-sided external iliac lymphadenopathy. Medial left external iliac node on image number 111 measures 18 mm. There is also a 16 mm node on image number 116. Left inguinal nodal mass measures 3.8 x 1.6 cm on image number 124. There is also an enlarged right external iliac lymph node on image number 106 measuring 9 mm. Right inguinal lymph node measures 22 x 9.5 mm on image number 123 Reproductive: The prostate gland is mildly enlarged. The seminal vesicles appear normal. Other: No ascites or abdominal wall hernia. Chest wall/musculoskeletal: No axillary  or supraclavicular adenopathy is identified. No definite destructive bony lesions involving the lumbar spine or pelvis. Incidental bilateral pars defects noted at L5. IMPRESSION: 1. Large left lower lobe lung mass, likely primary lung neoplasm with evidence of endobronchial spread of tumor, a satellite 2 cm nodule and mediastinal and hilar lymphadenopathy. 2. Underlying emphysematous changes. 3. Large destructive bone lesion involving the right scapula without other definite bone lesions 4. Suspect a single hepatic metastatic lesion in segment 6. 5. No definite adrenal gland metastasis. 6. Bilateral iliac and inguinal lymphadenopathy. Somewhat unusual pattern. It is possible this is metastatic lung cancer. A second primary such as lymphoma or prostate cancer I suppose would be possible. A left inguinal biopsy might be indicated in addition to bronchoscopic biopsy of the left lower lobe lung mass. Electronically Signed   By: Marijo Sanes M.D.   On: 01/29/2018 00:32   Dg Shoulder Right  Result Date: 01/22/2018 CLINICAL DATA:  Right shoulder pain for the past month. EXAM: RIGHT  SHOULDER - 2+ VIEW COMPARISON:  Right shoulder x-rays dated December 26, 2017. FINDINGS: No acute fracture or dislocation. There is increasing lucency and cortical irregularity of the superior glenoid extending into the coracoid with periosteal reaction. Joint spaces are preserved. Soft tissues are unremarkable. IMPRESSION: 1. Increasing lucency and cortical irregularity of the superior glenoid extending into the coracoid, concerning for underlying destructive bony lesion or infection. Recommend CT or MRI of the right shoulder for further evaluation. Electronically Signed   By: Titus Dubin M.D.   On: 01/22/2018 19:42   Ct Chest W Contrast  Result Date: 01/29/2018 CLINICAL DATA:  Followup recent CT scan of the shoulder showing destructive bone lesions consistent with metastatic disease. EXAM: CT CHEST, ABDOMEN, AND PELVIS WITH CONTRAST TECHNIQUE: Multidetector CT imaging of the chest, abdomen and pelvis was performed following the standard protocol during bolus administration of intravenous contrast. CONTRAST:  153mL ISOVUE-300 IOPAMIDOL (ISOVUE-300) INJECTION 61% COMPARISON:  CT shoulder 01/22/2018.  CT abdomen/pelvis 02/09/2014 FINDINGS: CT CHEST FINDINGS Cardiovascular: The heart is normal in size. No pericardial effusion. The aorta is normal in caliber. No dissection. Scattered atherosclerotic calcifications. The branch vessels are patent. Scattered coronary artery calcifications. Mediastinum/Nodes: Mediastinal and left hilar lymphadenopathy. Anterior mediastinal lymph node on image number 28 measures 10.5 mm. Subcarinal lymph node on image number 29 measures 14.5 mm. 3.2 x 1.2 cm left hilar nodal mass on image number 28. AP window node on image number 24 measures 13 mm. The esophagus is grossly normal. Lungs/Pleura: 5.1 x 3.6 cm left lower lobe mass surrounding the left lower lobe bronchi. There is tumor surrounding and involving the bronchi down toward the peripheral the long consistent with endobronchial  spread. There is a peripheral satellite lesion measuring 2 cm on image number 86. No definite right-sided pulmonary nodules.  No pleural effusions. Musculoskeletal: As demonstrated on the recent shoulder CT scan there is a large destructive lesion involving the right scapula, most notably the glenoid and coracoid process. I do not however see any other definite destructive bone lesions. CT ABDOMEN PELVIS FINDINGS Hepatobiliary: Low-attenuation lesion in segment 6 of the liver measuring 13 mm is suspicious for a metastatic focus. I do not see any other definite hepatic lesions. No intrahepatic biliary dilatation. The gallbladder is normal. Pancreas: No mass, inflammation or ductal dilatation. Spleen: Normal size.  No focal lesions. Adrenals/Urinary Tract: The adrenal glands and kidneys are unremarkable. No worrisome renal lesions simple appearing lower pole right renal cyst is noted. The bladder appears normal.  Stomach/Bowel: The stomach, duodenum, small bowel and colon are unremarkable. No acute inflammatory changes, mass lesions or obstructive findings. The terminal ileum and appendix are normal. Vascular/Lymphatic: Moderate atherosclerotic calcifications involving the aorta and iliac arteries. No aneurysm or dissection. The branch vessels are patent. The major venous structures are patent. No mesenteric or retroperitoneal mass or lymphadenopathy. There is left-sided external iliac lymphadenopathy. Medial left external iliac node on image number 111 measures 18 mm. There is also a 16 mm node on image number 116. Left inguinal nodal mass measures 3.8 x 1.6 cm on image number 124. There is also an enlarged right external iliac lymph node on image number 106 measuring 9 mm. Right inguinal lymph node measures 22 x 9.5 mm on image number 123 Reproductive: The prostate gland is mildly enlarged. The seminal vesicles appear normal. Other: No ascites or abdominal wall hernia. Chest wall/musculoskeletal: No axillary or  supraclavicular adenopathy is identified. No definite destructive bony lesions involving the lumbar spine or pelvis. Incidental bilateral pars defects noted at L5. IMPRESSION: 1. Large left lower lobe lung mass, likely primary lung neoplasm with evidence of endobronchial spread of tumor, a satellite 2 cm nodule and mediastinal and hilar lymphadenopathy. 2. Underlying emphysematous changes. 3. Large destructive bone lesion involving the right scapula without other definite bone lesions 4. Suspect a single hepatic metastatic lesion in segment 6. 5. No definite adrenal gland metastasis. 6. Bilateral iliac and inguinal lymphadenopathy. Somewhat unusual pattern. It is possible this is metastatic lung cancer. A second primary such as lymphoma or prostate cancer I suppose would be possible. A left inguinal biopsy might be indicated in addition to bronchoscopic biopsy of the left lower lobe lung mass. Electronically Signed   By: Marijo Sanes M.D.   On: 01/29/2018 00:32   Mr Jeri Cos JJ Contrast  Result Date: 02/03/2018 CLINICAL DATA:  57 y/o  M; lung cancer workup for staging. EXAM: MRI HEAD WITHOUT AND WITH CONTRAST TECHNIQUE: Multiplanar, multiecho pulse sequences of the brain and surrounding structures were obtained without and with intravenous contrast. CONTRAST:  10 cc Gadavist. COMPARISON:  03/26/2015 MRI head.  03/26/2015 CT head. FINDINGS: Brain: Greater than 20 enhancing masses diffusely throughout the brain measuring up to 7 mm in the left lateral frontal lobe. The lesions are present throughout the frontal, parietal, occipital, and temporal lobes as well as the cerebellar hemispheres and the pons. There is mild T2 FLAIR hyperintense signal abnormality compatible with edema associated with the larger masses. No associated mass effect. No acute infarction, hemorrhage, hydrocephalus, or extra-axial collection. Stable nonspecific T2 FLAIR hyperintensities in subcortical and periventricular white matter as well  as pons are compatible with moderate chronic microvascular ischemic changes for age. Stable volume loss of the brain. Stable very small chronic infarctions within the right inferior cerebellar hemisphere and the right lentiform nucleus extending into corona radiata. Vascular: Normal flow voids. Skull and upper cervical spine: Normal marrow signal. Sinuses/Orbits: Negative. Other: None. IMPRESSION: 1. Greater than 20 metastasis measuring up to 7 mm are present diffusely throughout the brain. There is mild edema associated with the larger lesions. No mass effect or hemorrhage. 2. Stable moderate chronic microvascular ischemic changes and volume loss of the brain. These results will be called to the ordering clinician or representative by the Radiologist Assistant, and communication documented in the PACS or zVision Dashboard. Electronically Signed   By: Kristine Garbe M.D.   On: 02/03/2018 14:51   Ct Shoulder Right Wo Contrast  Result Date: 01/22/2018 CLINICAL DATA:  Right shoulder pain for the past month. Possible glenoid destruction seen on x-ray. EXAM: CT OF THE UPPER RIGHT EXTREMITY WITHOUT CONTRAST TECHNIQUE: Multidetector CT imaging of the upper right extremity was performed according to the standard protocol. COMPARISON:  Right shoulder x-rays from same day. FINDINGS: Bones/Joint/Cartilage Large destructive lytic lesion involving the glenoid and coracoid, measuring approximately 5.2 x 4.0 x 4.9 cm. There is extra osseous extension anteriorly deep to the subscapularis muscle and posteriorly into the supraspinatus muscle. There is a smaller 1.3 x 1.6 x 2.1 cm lesion just slightly inferiorly involving the infra glenoid tubercle which appears separate from the larger, more superior lesion. No acute fracture or dislocation. No joint effusion. Mild glenohumeral joint space narrowing. Mild acromioclavicular osteoarthritis. Ligaments Suboptimally assessed by CT. Muscles and Tendons Involvement of the  supraspinatus and subscapularis fossae as above without clear fat plane between the lesion and the respective muscles. No muscle atrophy. Soft tissues No soft tissue mass or fluid collection. Mild emphysematous change in the right lung. IMPRESSION: 1. Large destructive lytic lesion involving the glenoid and coracoid. Smaller lesion just slightly inferior involving the infraglenoid tubercle appears separate from the larger lesion. This would suggest metastatic disease as the underlying etiology. 2. Extra-osseous extension of the larger lesion anteriorly into the subscapularis fossa and posteriorly into the supraspinatus fossa without a clear fat plane between the lesion and the respective muscles. Electronically Signed   By: Titus Dubin M.D.   On: 01/22/2018 20:47    Labs:  CBC: Recent Labs    01/22/18 2004  WBC 17.9*  HGB 12.9*  HCT 37.9*  PLT 370    COAGS: No results for input(s): INR, APTT in the last 8760 hours.  BMP: Recent Labs    01/22/18 2004  NA 135  K 2.7*  CL 105  CO2 20*  GLUCOSE 201*  BUN 15  CALCIUM 9.4  CREATININE 0.88  GFRNONAA >60  GFRAA >60    LIVER FUNCTION TESTS: No results for input(s): BILITOT, AST, ALT, ALKPHOS, PROT, ALBUMIN in the last 8760 hours.  TUMOR MARKERS: No results for input(s): AFPTM, CEA, CA199, CHROMGRNA in the last 8760 hours.  Assessment and Plan:  Probable metastatic lung cancer Right scapular lytic lesion Scheduled for biopsy of same Risks and benefits discussed with the patient including, but not limited to bleeding, infection, damage to adjacent structures or low yield requiring additional tests.  All of the patient's questions were answered, patient is agreeable to proceed. Consent signed and in chart.   Thank you for this interesting consult.  I greatly enjoyed meeting JOAL EAKLE and look forward to participating in their care.  A copy of this report was sent to the requesting provider on this  date.  Electronically Signed: Lavonia Drafts, PA-C 02/08/2018, 10:05 AM   I spent a total of  30 Minutes   in face to face in clinical consultation, greater than 50% of which was counseling/coordinating care for right scapular lesion biopsy

## 2018-02-08 NOTE — Progress Notes (Signed)
Potassium 2.7.  Pt notified to pick up prescription for potassium at [facility] per Dr Arnoldo Morale as soon as possible. Encouraged to eat foods high in potassium today including bananas per anesthesia.

## 2018-02-08 NOTE — Discharge Instructions (Signed)
Moderate Conscious Sedation, Adult, Care After These instructions provide you with information about caring for yourself after your procedure. Your health care provider may also give you more specific instructions. Your treatment has been planned according to current medical practices, but problems sometimes occur. Call your health care provider if you have any problems or questions after your procedure. What can I expect after the procedure? After your procedure, it is common:  To feel sleepy for several hours.  To feel clumsy and have poor balance for several hours.  To have poor judgment for several hours.  To vomit if you eat too soon.  Follow these instructions at home: For at least 24 hours after the procedure:   Do not: ? Participate in activities where you could fall or become injured. ? Drive. ? Use heavy machinery. ? Drink alcohol. ? Take sleeping pills or medicines that cause drowsiness. ? Make important decisions or sign legal documents. ? Take care of children on your own.  Rest. Eating and drinking  Follow the diet recommended by your health care provider.  If you vomit: ? Drink water, juice, or soup when you can drink without vomiting. ? Make sure you have little or no nausea before eating solid foods. General instructions  Have a responsible adult stay with you until you are awake and alert.  Take over-the-counter and prescription medicines only as told by your health care provider.  If you smoke, do not smoke without supervision.  Keep all follow-up visits as told by your health care provider. This is important. Contact a health care provider if:  You keep feeling nauseous or you keep vomiting.  You feel light-headed.  You develop a rash.  You have a fever. Get help right away if:  You have trouble breathing. This information is not intended to replace advice given to you by your health care provider. Make sure you discuss any questions you have  with your health care provider. Document Released: 12/28/2012 Document Revised: 08/12/2015 Document Reviewed: 06/29/2015 Elsevier Interactive Patient Education  2018 [employer].   Needle Biopsy, Care After These instructions give you information about caring for yourself after your procedure. Your doctor may also give you more specific instructions. Call your doctor if you have any problems or questions after your procedure. Follow these instructions at home:  Rest as told by your doctor.  Take medicines only as told by your doctor.  There are many different ways to close and cover the biopsy site, including stitches (sutures), skin glue, and adhesive strips. Follow instructions from your doctor about: ? How to take care of your biopsy site. ? When and how you should change your bandage (dressing). ? When you should remove your dressing. ? Removing whatever was used to close your biopsy site.  Check your biopsy site every day for signs of infection. Watch for: ? Redness, swelling, or pain. ? Fluid, blood, or pus. Contact a doctor if:  You have a fever.  You have redness, swelling, or pain at the biopsy site, and it lasts longer than a few days.  You have fluid, blood, or pus coming from the biopsy site.  You feel sick to your stomach (nauseous).  You throw up (vomit). Get help right away if:  You are short of breath.  You have trouble breathing.  Your chest hurts.  You feel dizzy or you pass out (faint).  You have bleeding that does not stop with pressure or a bandage.  You cough up blood.  Your belly (abdomen) hurts. This information is not intended to replace advice given to you by your health care provider. Make sure you discuss any questions you have with your health care provider. Document Released: 02/20/2008 Document Revised: 08/15/2015 Document Reviewed: 03/05/2014 Elsevier Interactive Patient Education  [employer].

## 2018-02-09 ENCOUNTER — Ambulatory Visit (HOSPITAL_COMMUNITY)
Admission: RE | Admit: 2018-02-09 | Discharge: 2018-02-09 | Disposition: A | Discharge status: Home / Self Care | Payer: Medicaid Other | Source: Ambulatory Visit | Attending: General Surgery | Admitting: General Surgery

## 2018-02-09 ENCOUNTER — Other Ambulatory Visit (HOSPITAL_COMMUNITY): Payer: Self-pay | Admitting: *Deleted

## 2018-02-09 ENCOUNTER — Encounter (HOSPITAL_COMMUNITY)
Admission: RE | Disposition: A | Discharge status: Home / Self Care | Payer: Self-pay | Source: Ambulatory Visit | Attending: General Surgery

## 2018-02-09 ENCOUNTER — Encounter (HOSPITAL_COMMUNITY): Payer: Self-pay | Admitting: Anesthesiology

## 2018-02-09 ENCOUNTER — Encounter (HOSPITAL_COMMUNITY): Payer: Self-pay | Admitting: *Deleted

## 2018-02-09 DIAGNOSIS — E785 Hyperlipidemia, unspecified: Secondary | ICD-10-CM | POA: Diagnosis not present

## 2018-02-09 DIAGNOSIS — I2510 Atherosclerotic heart disease of native coronary artery without angina pectoris: Secondary | ICD-10-CM | POA: Insufficient documentation

## 2018-02-09 DIAGNOSIS — I10 Essential (primary) hypertension: Secondary | ICD-10-CM | POA: Insufficient documentation

## 2018-02-09 DIAGNOSIS — F17210 Nicotine dependence, cigarettes, uncomplicated: Secondary | ICD-10-CM | POA: Diagnosis not present

## 2018-02-09 DIAGNOSIS — Z5329 Procedure and treatment not carried out because of patient's decision for other reasons: Secondary | ICD-10-CM | POA: Insufficient documentation

## 2018-02-09 DIAGNOSIS — Z8673 Personal history of transient ischemic attack (TIA), and cerebral infarction without residual deficits: Secondary | ICD-10-CM | POA: Insufficient documentation

## 2018-02-09 DIAGNOSIS — C3492 Malignant neoplasm of unspecified part of left bronchus or lung: Secondary | ICD-10-CM | POA: Insufficient documentation

## 2018-02-09 DIAGNOSIS — Z951 Presence of aortocoronary bypass graft: Secondary | ICD-10-CM | POA: Insufficient documentation

## 2018-02-09 DIAGNOSIS — I252 Old myocardial infarction: Secondary | ICD-10-CM | POA: Diagnosis not present

## 2018-02-09 DIAGNOSIS — Z79899 Other long term (current) drug therapy: Secondary | ICD-10-CM | POA: Diagnosis not present

## 2018-02-09 DIAGNOSIS — Z7982 Long term (current) use of aspirin: Secondary | ICD-10-CM | POA: Insufficient documentation

## 2018-02-09 LAB — POCT I-STAT 4, (NA,K, GLUC, HGB,HCT)
Glucose, Bld: 102 mg/dL — ABNORMAL HIGH (ref 70–99)
HEMATOCRIT: 37 % — AB (ref 39.0–52.0)
Hemoglobin: 12.6 g/dL — ABNORMAL LOW (ref 13.0–17.0)
Potassium: 3.1 mmol/L — ABNORMAL LOW (ref 3.5–5.1)
SODIUM: 140 mmol/L (ref 135–145)

## 2018-02-09 SURGERY — INSERTION, TUNNELED CENTRAL VENOUS DEVICE, WITH PORT
Anesthesia: Monitor Anesthesia Care | Laterality: Left

## 2018-02-09 MED ORDER — CHLORHEXIDINE GLUCONATE CLOTH 2 % EX PADS: 6.0000 | MEDICATED_PAD | Freq: Once | CUTANEOUS | Status: DC

## 2018-02-09 MED ORDER — CEFAZOLIN SODIUM-DEXTROSE 2-4 GM/100ML-% IV SOLN
INTRAVENOUS | Status: AC
  Filled 2018-02-09: qty 100

## 2018-02-09 MED ORDER — LIDOCAINE HCL (PF) 1 % IJ SOLN
INTRAMUSCULAR | Status: AC
  Filled 2018-02-09: qty 30

## 2018-02-09 MED ORDER — HEPARIN SOD (PORK) LOCK FLUSH 100 UNIT/ML IV SOLN
INTRAVENOUS | Status: AC
  Filled 2018-02-09: qty 5

## 2018-02-09 MED ORDER — CEFAZOLIN SODIUM-DEXTROSE 2-4 GM/100ML-% IV SOLN: 2.0000 g | INTRAVENOUS | Status: DC

## 2018-02-09 NOTE — Progress Notes (Signed)
Patient waiting in holding area pre op # 2.  Dr Hilaria Ota in to assess patient.  Pt stated "I am tired of waiting and I am going Home"   Dr Hilaria Ota offered to give him sedation while he was waiting.  Patient refused and climbed to the foot of the stretcher and started taking his gown off.  Patient's wife at bedside trying to talk to patient.  IV removed.  Patient's wife to call office to reschedule for a 7:30 am start.  Dr Arnoldo Morale notified

## 2018-02-09 NOTE — Progress Notes (Signed)
Patient requested to have port placed at [facility]. Orders placed for IR guided port placement. After appointment is scheduled, patient will be notified.

## 2018-02-10 ENCOUNTER — Other Ambulatory Visit: Payer: Self-pay | Admitting: Radiology

## 2018-02-11 ENCOUNTER — Ambulatory Visit (HOSPITAL_COMMUNITY)
Admission: RE | Admit: 2018-02-11 | Discharge: 2018-02-11 | Disposition: A | Discharge status: Home / Self Care | Payer: Medicaid Other | Source: Ambulatory Visit | Attending: Hematology | Admitting: Hematology

## 2018-02-11 ENCOUNTER — Encounter (HOSPITAL_COMMUNITY): Payer: Self-pay

## 2018-02-11 DIAGNOSIS — Z8673 Personal history of transient ischemic attack (TIA), and cerebral infarction without residual deficits: Secondary | ICD-10-CM | POA: Diagnosis not present

## 2018-02-11 DIAGNOSIS — C3492 Malignant neoplasm of unspecified part of left bronchus or lung: Secondary | ICD-10-CM | POA: Insufficient documentation

## 2018-02-11 DIAGNOSIS — Z9889 Other specified postprocedural states: Secondary | ICD-10-CM | POA: Insufficient documentation

## 2018-02-11 DIAGNOSIS — Z7982 Long term (current) use of aspirin: Secondary | ICD-10-CM | POA: Diagnosis not present

## 2018-02-11 DIAGNOSIS — Z955 Presence of coronary angioplasty implant and graft: Secondary | ICD-10-CM | POA: Insufficient documentation

## 2018-02-11 DIAGNOSIS — I10 Essential (primary) hypertension: Secondary | ICD-10-CM | POA: Diagnosis not present

## 2018-02-11 DIAGNOSIS — G939 Disorder of brain, unspecified: Secondary | ICD-10-CM | POA: Diagnosis not present

## 2018-02-11 DIAGNOSIS — Z885 Allergy status to narcotic agent status: Secondary | ICD-10-CM | POA: Insufficient documentation

## 2018-02-11 DIAGNOSIS — M899 Disorder of bone, unspecified: Secondary | ICD-10-CM | POA: Diagnosis not present

## 2018-02-11 DIAGNOSIS — Z79899 Other long term (current) drug therapy: Secondary | ICD-10-CM | POA: Diagnosis not present

## 2018-02-11 DIAGNOSIS — Z8249 Family history of ischemic heart disease and other diseases of the circulatory system: Secondary | ICD-10-CM | POA: Diagnosis not present

## 2018-02-11 DIAGNOSIS — E785 Hyperlipidemia, unspecified: Secondary | ICD-10-CM | POA: Insufficient documentation

## 2018-02-11 DIAGNOSIS — Z801 Family history of malignant neoplasm of trachea, bronchus and lung: Secondary | ICD-10-CM | POA: Diagnosis not present

## 2018-02-11 DIAGNOSIS — I480 Paroxysmal atrial fibrillation: Secondary | ICD-10-CM | POA: Diagnosis not present

## 2018-02-11 DIAGNOSIS — I2510 Atherosclerotic heart disease of native coronary artery without angina pectoris: Secondary | ICD-10-CM | POA: Diagnosis not present

## 2018-02-11 DIAGNOSIS — F17210 Nicotine dependence, cigarettes, uncomplicated: Secondary | ICD-10-CM | POA: Insufficient documentation

## 2018-02-11 HISTORY — PX: IR IMAGING GUIDED PORT INSERTION: IMG5740

## 2018-02-11 LAB — CBC
HEMATOCRIT: 36.1 % — AB (ref 39.0–52.0)
Hemoglobin: 11.6 g/dL — ABNORMAL LOW (ref 13.0–17.0)
MCH: 28.4 pg (ref 26.0–34.0)
MCHC: 32.1 g/dL (ref 30.0–36.0)
MCV: 88.3 fL (ref 80.0–100.0)
NRBC: 0 % (ref 0.0–0.2)
Platelets: 362 10*3/uL (ref 150–400)
RBC: 4.09 MIL/uL — AB (ref 4.22–5.81)
RDW: 13.9 % (ref 11.5–15.5)
WBC: 11.3 10*3/uL — ABNORMAL HIGH (ref 4.0–10.5)

## 2018-02-11 MED ORDER — FENTANYL CITRATE (PF) 100 MCG/2ML IJ SOLN
INTRAMUSCULAR | Status: AC | PRN
  Administered 2018-02-11 (×2): 50 ug via INTRAVENOUS

## 2018-02-11 MED ORDER — MIDAZOLAM HCL 2 MG/2ML IJ SOLN
INTRAMUSCULAR | Status: AC | PRN
  Administered 2018-02-11 (×2): 1 mg via INTRAVENOUS

## 2018-02-11 MED ORDER — CEFAZOLIN SODIUM-DEXTROSE 2-4 GM/100ML-% IV SOLN
INTRAVENOUS | Status: AC
  Filled 2018-02-11: qty 100

## 2018-02-11 MED ORDER — SODIUM CHLORIDE 0.9 % IV SOLN: INTRAVENOUS | Status: DC

## 2018-02-11 MED ORDER — LIDOCAINE-EPINEPHRINE (PF) 1 %-1:200000 IJ SOLN
INTRAMUSCULAR | Status: AC | PRN
  Administered 2018-02-11: 10 mL

## 2018-02-11 MED ORDER — NALOXONE HCL 0.4 MG/ML IJ SOLN
INTRAMUSCULAR | Status: AC
  Filled 2018-02-11: qty 1

## 2018-02-11 MED ORDER — FENTANYL CITRATE (PF) 100 MCG/2ML IJ SOLN
INTRAMUSCULAR | Status: AC
  Filled 2018-02-11: qty 4

## 2018-02-11 MED ORDER — FLUMAZENIL 0.5 MG/5ML IV SOLN
INTRAVENOUS | Status: AC
  Filled 2018-02-11: qty 5

## 2018-02-11 MED ORDER — CEFAZOLIN SODIUM-DEXTROSE 2-4 GM/100ML-% IV SOLN
2.0000 g | INTRAVENOUS | Status: AC
  Administered 2018-02-11: 2 g via INTRAVENOUS

## 2018-02-11 MED ORDER — MIDAZOLAM HCL 2 MG/2ML IJ SOLN
INTRAMUSCULAR | Status: AC
  Filled 2018-02-11: qty 4

## 2018-02-11 NOTE — Procedures (Signed)
Met lung ca  S/p LT IJ POWER PORT  TIP SVCRA NO COMP STABLE EBL MIN READY FOR USE

## 2018-02-11 NOTE — Discharge Instructions (Signed)
Implanted Port Insertion, Care After °This sheet gives you information about how to care for yourself after your procedure. Your health care provider may also give you more specific instructions. If you have problems or questions, contact your health care provider. °What can I expect after the procedure? °After your procedure, it is common to have: °· Discomfort at the port insertion site. °· Bruising on the skin over the port. This should improve over 3-4 days. ° °Follow these instructions at home: °Port care °· After your port is placed, you will get a manufacturer's information card. The card has information about your port. Keep this card with you at all times. °· Take care of the port as told by your health care provider. Ask your health care provider if you or a family member can get training for taking care of the port at home. A home health care nurse may also take care of the port. °· Make sure to remember what type of port you have. °Incision care °· Follow instructions from your health care provider about how to take care of your port insertion site. Make sure you: °? Wash your hands with soap and water before you change your bandage (dressing). If soap and water are not available, use hand sanitizer. °? Change your dressing as told by your health care provider. °? Leave stitches (sutures), skin glue, or adhesive strips in place. These skin closures may need to stay in place for 2 weeks or longer. If adhesive strip edges start to loosen and curl up, you may trim the loose edges. Do not remove adhesive strips completely unless your health care provider tells you to do that. °· Check your port insertion site every day for signs of infection. Check for: °? More redness, swelling, or pain. °? More fluid or blood. °? Warmth. °? Pus or a bad smell. °General instructions °· Do not take baths, swim, or use a hot tub until your health care provider approves. °· Do not lift anything that is heavier than 10 lb (4.5  kg) for a week, or as told by your health care provider. °· Ask your health care provider when it is okay to: °? Return to work or school. °? Resume usual physical activities or sports. °· Do not drive for 24 hours if you were given a medicine to help you relax (sedative). °· Take over-the-counter and prescription medicines only as told by your health care provider. °· Wear a medical alert bracelet in case of an emergency. This will tell any health care providers that you have a port. °· Keep all follow-up visits as told by your health care provider. This is important. °Contact a health care provider if: °· You cannot flush your port with saline as directed, or you cannot draw blood from the port. °· You have a fever or chills. °· You have more redness, swelling, or pain around your port insertion site. °· You have more fluid or blood coming from your port insertion site. °· Your port insertion site feels warm to the touch. °· You have pus or a bad smell coming from the port insertion site. °Get help right away if: °· You have chest pain or shortness of breath. °· You have bleeding from your port that you cannot control. °Summary °· Take care of the port as told by your health care provider. °· Change your dressing as told by your health care provider. °· Keep all follow-up visits as told by your health care provider. °  This information is not intended to replace advice given to you by your health care provider. Make sure you discuss any questions you have with your health care provider. °Document Released: 12/28/2012 Document Revised: 01/29/2016 Document Reviewed: 01/29/2016 °Elsevier Interactive Patient Education © 2017 [employer] ° °

## 2018-02-11 NOTE — H&P (Signed)
Chief Complaint: Patient was seen in consultation today for [facility] a cath placement at the request of Katragadda,Sreedhar  Referring Physician(s): Firefighter  Supervising Physician: Daryll Brod  Patient Status: [facility] - Out-pt  History of Present Illness: Ian Roberts is a 57 y.o. 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 Bone lesion    right shoulder found on x-ray  . CAD (coronary artery disease)   . Dyslipidemia   . Hypertension   . Hypokalemia   . Necrotizing fasciitis ([place])   . Rotator cuff tear   . ST elevation myocardial infarction (STEMI) of inferior wall (HCC)    Assocaited with paroxysmal atrial fibrillation 05/13/2008; Xience drug eluting stent  . Stroke [facility])     Past Surgical History:  Procedure Laterality Date  . CORONARY ANGIOPLASTY WITH STENT PLACEMENT    . CYST EXCISION    . TONSILLECTOMY      Allergies: Hydrocodone  Medications: Prior to Admission medications   Medication Sig Start Date End Date Taking? Authorizing Provider  aspirin EC 325 MG tablet Take 325 mg by mouth daily.    Yes [provider]  ibuprofen (ADVIL,MOTRIN) 200 MG tablet Take 600 mg by mouth every 6 (six) hours as needed for headache or moderate pain.  01/21/18  Yes [provider]  potassium chloride 20 MEQ TBCR Take 20 mEq by mouth 3 (three) times daily. 02/08/18  Yes Aviva Signs, MD  prochlorperazine (COMPAZINE) 10 MG tablet Take 1 tablet (10 mg total) by mouth every 6 (six) hours as needed for nausea or vomiting. 01/31/18  Yes Derek Jack, MD  amLODipine (NORVASC) 10 MG tablet Take 10 mg by mouth daily.     [provider]  docusate sodium (COLACE) 100 MG capsule Take 100 mg by  mouth daily.     [provider]  hydrochlorothiazide (HYDRODIURIL) 25 MG tablet Take 25 mg by mouth daily.     [provider]  lisinopril (PRINIVIL,ZESTRIL) 20 MG tablet Take 20 mg by mouth daily.    [provider]  morphine (MSIR) 30 MG tablet Take 1 tablet (30 mg total) by mouth every 6 (six) hours as needed for severe pain. 01/31/18   Derek Jack, MD  ondansetron (ZOFRAN ODT) 4 MG disintegrating tablet Take 1 tablet (4 mg total) by mouth every 8 (eight) hours as needed for nausea or vomiting. 01/22/18   Long, Wonda Olds, MD  simvastatin (ZOCOR) 20 MG tablet Take 20 mg by mouth daily.     [provider]     Family History  Problem Relation Age of Onset  . Diabetes Mother   . Hypertension Mother   . Lung cancer Father     Social History   Socioeconomic History  . Marital status: Married    Spouse name: Not on file  . Number of children: 2  . Years of education: Not on file  . Highest education level: Not on file  Occupational History  . Occupation: Full time Dealer    Comment: unemployed  Social Needs  . Financial resource strain: Not very hard  . Food insecurity:    Worry: Never true    Inability: Never true  . Transportation needs:    Medical: No    Non-medical: No  Tobacco Use  . Smoking status: Current Every Day Smoker    Packs/day: 1.50    Years: 41.00    Pack years: 61.50    Types: Cigarettes  . Smokeless tobacco: Never Used  Substance and Sexual Activity  . Alcohol use: Never    Frequency: Never  . Drug use: Never  . Sexual activity: Not Currently  Lifestyle  . Physical activity:    Days per week: 0 days    Minutes per session: 0 min  . Stress: To some extent  Relationships  . Social connections:    Talks on phone: More than three times a week    Gets together: More than three times a week    Attends religious service: Never    Active member of club or organization: No    Attends meetings of clubs or  organizations: Never    Relationship status: Married  Other Topics Concern  . Not on file  Social History Narrative   Married    Review of Systems: A 12 point ROS discussed and pertinent positives are indicated in the HPI above.  All other systems are negative.  Review of Systems  Constitutional: Positive for activity change and fatigue.  Gastrointestinal: Negative for abdominal pain.  Musculoskeletal: Positive for back pain.  Psychiatric/Behavioral: Negative for behavioral problems and confusion.    Vital Signs: BP 137/75   Pulse 90   Temp 98 F (36.7 C)   Resp 18   Ht 5\' 11"  (1.803 m)   Wt 186 lb (84.4 kg)   SpO2 96%   BMI 25.94 kg/m   Physical Exam  Constitutional: He is oriented to person, place, and time.  Cardiovascular: Normal rate, regular rhythm and normal heart sounds.  Pulmonary/Chest: Effort normal. He has wheezes.  Abdominal: Soft. Bowel sounds are normal.  Musculoskeletal: Normal range of motion.  Neurological: He is alert and oriented to person, place, and time.  Skin: Skin is warm and dry.  Psychiatric: He has a normal mood and affect. His behavior is normal. Judgment and thought content normal.  Vitals reviewed.   Imaging: Ct Abdomen Pelvis W Wo Contrast  Result Date: 01/29/2018 CLINICAL DATA:  Followup recent CT scan of the shoulder showing destructive bone lesions consistent with metastatic disease. EXAM: CT CHEST, ABDOMEN, AND PELVIS WITH CONTRAST TECHNIQUE: Multidetector CT imaging of the chest, abdomen and pelvis was performed following the standard protocol during bolus administration of intravenous contrast. CONTRAST:  126mL ISOVUE-300 IOPAMIDOL (ISOVUE-300) INJECTION 61% COMPARISON:  CT shoulder 01/22/2018.  CT abdomen/pelvis 02/09/2014 FINDINGS: CT CHEST FINDINGS Cardiovascular: The heart is normal in size. No pericardial effusion. The aorta is normal in caliber. No dissection. Scattered atherosclerotic calcifications. The branch vessels are  patent. Scattered coronary artery calcifications. Mediastinum/Nodes: Mediastinal and left hilar lymphadenopathy. Anterior mediastinal lymph node on image number 28 measures 10.5 mm. Subcarinal lymph node on image number 29 measures 14.5 mm. 3.2 x 1.2 cm left hilar nodal mass on image number 28. AP window node on image number 24 measures 13 mm. The esophagus is grossly normal. Lungs/Pleura: 5.1 x 3.6 cm left lower lobe mass surrounding the left lower lobe bronchi. There is tumor surrounding and involving the bronchi down toward the peripheral the long consistent with endobronchial spread. There is a peripheral satellite lesion measuring 2 cm on image number 86. No definite right-sided pulmonary nodules.  No pleural effusions. Musculoskeletal: As demonstrated on the recent shoulder CT scan there is a large destructive lesion involving the right scapula, most notably  the glenoid and coracoid process. I do not however see any other definite destructive bone lesions. CT ABDOMEN PELVIS FINDINGS Hepatobiliary: Low-attenuation lesion in segment 6 of the liver measuring 13 mm is suspicious for a metastatic focus. I do not see any other definite hepatic lesions. No intrahepatic biliary dilatation. The gallbladder is normal. Pancreas: No mass, inflammation or ductal dilatation. Spleen: Normal size.  No focal lesions. Adrenals/Urinary Tract: The adrenal glands and kidneys are unremarkable. No worrisome renal lesions simple appearing lower pole right renal cyst is noted. The bladder appears normal. Stomach/Bowel: The stomach, duodenum, small bowel and colon are unremarkable. No acute inflammatory changes, mass lesions or obstructive findings. The terminal ileum and appendix are normal. Vascular/Lymphatic: Moderate atherosclerotic calcifications involving the aorta and iliac arteries. No aneurysm or dissection. The branch vessels are patent. The major venous structures are patent. No mesenteric or retroperitoneal mass or  lymphadenopathy. There is left-sided external iliac lymphadenopathy. Medial left external iliac node on image number 111 measures 18 mm. There is also a 16 mm node on image number 116. Left inguinal nodal mass measures 3.8 x 1.6 cm on image number 124. There is also an enlarged right external iliac lymph node on image number 106 measuring 9 mm. Right inguinal lymph node measures 22 x 9.5 mm on image number 123 Reproductive: The prostate gland is mildly enlarged. The seminal vesicles appear normal. Other: No ascites or abdominal wall hernia. Chest wall/musculoskeletal: No axillary or supraclavicular adenopathy is identified. No definite destructive bony lesions involving the lumbar spine or pelvis. Incidental bilateral pars defects noted at L5. IMPRESSION: 1. Large left lower lobe lung mass, likely primary lung neoplasm with evidence of endobronchial spread of tumor, a satellite 2 cm nodule and mediastinal and hilar lymphadenopathy. 2. Underlying emphysematous changes. 3. Large destructive bone lesion involving the right scapula without other definite bone lesions 4. Suspect a single hepatic metastatic lesion in segment 6. 5. No definite adrenal gland metastasis. 6. Bilateral iliac and inguinal lymphadenopathy. Somewhat unusual pattern. It is possible this is metastatic lung cancer. A second primary such as lymphoma or prostate cancer I suppose would be possible. A left inguinal biopsy might be indicated in addition to bronchoscopic biopsy of the left lower lobe lung mass. Electronically Signed   By: Marijo Sanes M.D.   On: 01/29/2018 00:32   Dg Shoulder Right  Result Date: 01/22/2018 CLINICAL DATA:  Right shoulder pain for the past month. EXAM: RIGHT SHOULDER - 2+ VIEW COMPARISON:  Right shoulder x-rays dated December 26, 2017. FINDINGS: No acute fracture or dislocation. There is increasing lucency and cortical irregularity of the superior glenoid extending into the coracoid with periosteal reaction. Joint spaces  are preserved. Soft tissues are unremarkable. IMPRESSION: 1. Increasing lucency and cortical irregularity of the superior glenoid extending into the coracoid, concerning for underlying destructive bony lesion or infection. Recommend CT or MRI of the right shoulder for further evaluation. Electronically Signed   By: Titus Dubin M.D.   On: 01/22/2018 19:42   Ct Chest W Contrast  Result Date: 01/29/2018 CLINICAL DATA:  Followup recent CT scan of the shoulder showing destructive bone lesions consistent with metastatic disease. EXAM: CT CHEST, ABDOMEN, AND PELVIS WITH CONTRAST TECHNIQUE: Multidetector CT imaging of the chest, abdomen and pelvis was performed following the standard protocol during bolus administration of intravenous contrast. CONTRAST:  131mL ISOVUE-300 IOPAMIDOL (ISOVUE-300) INJECTION 61% COMPARISON:  CT shoulder 01/22/2018.  CT abdomen/pelvis 02/09/2014 FINDINGS: CT CHEST FINDINGS Cardiovascular: The heart is normal in size.  No pericardial effusion. The aorta is normal in caliber. No dissection. Scattered atherosclerotic calcifications. The branch vessels are patent. Scattered coronary artery calcifications. Mediastinum/Nodes: Mediastinal and left hilar lymphadenopathy. Anterior mediastinal lymph node on image number 28 measures 10.5 mm. Subcarinal lymph node on image number 29 measures 14.5 mm. 3.2 x 1.2 cm left hilar nodal mass on image number 28. AP window node on image number 24 measures 13 mm. The esophagus is grossly normal. Lungs/Pleura: 5.1 x 3.6 cm left lower lobe mass surrounding the left lower lobe bronchi. There is tumor surrounding and involving the bronchi down toward the peripheral the long consistent with endobronchial spread. There is a peripheral satellite lesion measuring 2 cm on image number 86. No definite right-sided pulmonary nodules.  No pleural effusions. Musculoskeletal: As demonstrated on the recent shoulder CT scan there is a large destructive lesion involving the  right scapula, most notably the glenoid and coracoid process. I do not however see any other definite destructive bone lesions. CT ABDOMEN PELVIS FINDINGS Hepatobiliary: Low-attenuation lesion in segment 6 of the liver measuring 13 mm is suspicious for a metastatic focus. I do not see any other definite hepatic lesions. No intrahepatic biliary dilatation. The gallbladder is normal. Pancreas: No mass, inflammation or ductal dilatation. Spleen: Normal size.  No focal lesions. Adrenals/Urinary Tract: The adrenal glands and kidneys are unremarkable. No worrisome renal lesions simple appearing lower pole right renal cyst is noted. The bladder appears normal. Stomach/Bowel: The stomach, duodenum, small bowel and colon are unremarkable. No acute inflammatory changes, mass lesions or obstructive findings. The terminal ileum and appendix are normal. Vascular/Lymphatic: Moderate atherosclerotic calcifications involving the aorta and iliac arteries. No aneurysm or dissection. The branch vessels are patent. The major venous structures are patent. No mesenteric or retroperitoneal mass or lymphadenopathy. There is left-sided external iliac lymphadenopathy. Medial left external iliac node on image number 111 measures 18 mm. There is also a 16 mm node on image number 116. Left inguinal nodal mass measures 3.8 x 1.6 cm on image number 124. There is also an enlarged right external iliac lymph node on image number 106 measuring 9 mm. Right inguinal lymph node measures 22 x 9.5 mm on image number 123 Reproductive: The prostate gland is mildly enlarged. The seminal vesicles appear normal. Other: No ascites or abdominal wall hernia. Chest wall/musculoskeletal: No axillary or supraclavicular adenopathy is identified. No definite destructive bony lesions involving the lumbar spine or pelvis. Incidental bilateral pars defects noted at L5. IMPRESSION: 1. Large left lower lobe lung mass, likely primary lung neoplasm with evidence of  endobronchial spread of tumor, a satellite 2 cm nodule and mediastinal and hilar lymphadenopathy. 2. Underlying emphysematous changes. 3. Large destructive bone lesion involving the right scapula without other definite bone lesions 4. Suspect a single hepatic metastatic lesion in segment 6. 5. No definite adrenal gland metastasis. 6. Bilateral iliac and inguinal lymphadenopathy. Somewhat unusual pattern. It is possible this is metastatic lung cancer. A second primary such as lymphoma or prostate cancer I suppose would be possible. A left inguinal biopsy might be indicated in addition to bronchoscopic biopsy of the left lower lobe lung mass. Electronically Signed   By: Marijo Sanes M.D.   On: 01/29/2018 00:32   Mr Jeri Cos OE Contrast  Result Date: 02/03/2018 CLINICAL DATA:  57 y/o  M; lung cancer workup for staging. EXAM: MRI HEAD WITHOUT AND WITH CONTRAST TECHNIQUE: Multiplanar, multiecho pulse sequences of the brain and surrounding structures were obtained without and with intravenous  contrast. CONTRAST:  10 cc Gadavist. COMPARISON:  03/26/2015 MRI head.  03/26/2015 CT head. FINDINGS: Brain: Greater than 20 enhancing masses diffusely throughout the brain measuring up to 7 mm in the left lateral frontal lobe. The lesions are present throughout the frontal, parietal, occipital, and temporal lobes as well as the cerebellar hemispheres and the pons. There is mild T2 FLAIR hyperintense signal abnormality compatible with edema associated with the larger masses. No associated mass effect. No acute infarction, hemorrhage, hydrocephalus, or extra-axial collection. Stable nonspecific T2 FLAIR hyperintensities in subcortical and periventricular white matter as well as pons are compatible with moderate chronic microvascular ischemic changes for age. Stable volume loss of the brain. Stable very small chronic infarctions within the right inferior cerebellar hemisphere and the right lentiform nucleus extending into corona  radiata. Vascular: Normal flow voids. Skull and upper cervical spine: Normal marrow signal. Sinuses/Orbits: Negative. Other: None. IMPRESSION: 1. Greater than 20 metastasis measuring up to 7 mm are present diffusely throughout the brain. There is mild edema associated with the larger lesions. No mass effect or hemorrhage. 2. Stable moderate chronic microvascular ischemic changes and volume loss of the brain. These results will be called to the ordering clinician or representative by the Radiologist Assistant, and communication documented in the PACS or zVision Dashboard. Electronically Signed   By: Kristine Garbe M.D.   On: 02/03/2018 14:51   Ct Shoulder Right Wo Contrast  Result Date: 01/22/2018 CLINICAL DATA:  Right shoulder pain for the past month. Possible glenoid destruction seen on x-ray. EXAM: CT OF THE UPPER RIGHT EXTREMITY WITHOUT CONTRAST TECHNIQUE: Multidetector CT imaging of the upper right extremity was performed according to the standard protocol. COMPARISON:  Right shoulder x-rays from same day. FINDINGS: Bones/Joint/Cartilage Large destructive lytic lesion involving the glenoid and coracoid, measuring approximately 5.2 x 4.0 x 4.9 cm. There is extra osseous extension anteriorly deep to the subscapularis muscle and posteriorly into the supraspinatus muscle. There is a smaller 1.3 x 1.6 x 2.1 cm lesion just slightly inferiorly involving the infra glenoid tubercle which appears separate from the larger, more superior lesion. No acute fracture or dislocation. No joint effusion. Mild glenohumeral joint space narrowing. Mild acromioclavicular osteoarthritis. Ligaments Suboptimally assessed by CT. Muscles and Tendons Involvement of the supraspinatus and subscapularis fossae as above without clear fat plane between the lesion and the respective muscles. No muscle atrophy. Soft tissues No soft tissue mass or fluid collection. Mild emphysematous change in the right lung. IMPRESSION: 1. Large  destructive lytic lesion involving the glenoid and coracoid. Smaller lesion just slightly inferior involving the infraglenoid tubercle appears separate from the larger lesion. This would suggest metastatic disease as the underlying etiology. 2. Extra-osseous extension of the larger lesion anteriorly into the subscapularis fossa and posteriorly into the supraspinatus fossa without a clear fat plane between the lesion and the respective muscles. Electronically Signed   By: Titus Dubin M.D.   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 EXAM: CT GUIDED CORE BIOPSY OF RIGHT SCAPULAR MASS ANESTHESIA/SEDATION: 1.5 mg IV Versed; 100 mcg IV Fentanyl Total Moderate Sedation Time:  15 minutes. The patient's level of consciousness and physiologic status were continuously monitored during the procedure by Radiology nursing. PROCEDURE: The procedure risks, benefits, and alternatives were explained to the patient. Questions regarding the procedure were encouraged and answered. The patient understands and consents  to the procedure. A time-out was performed prior to initiating the procedure. CT was performed of the right shoulder region in a supine position. The right anterior shoulder region was prepped with chlorhexidine in a sterile fashion, and a sterile drape was applied covering the operative field. A sterile gown and sterile gloves were used for the procedure. Local anesthesia was provided with 1% Lidocaine. Under CT guidance, a 17 gauge needle was advanced into the anterior right scapula at the level of the glenoid. A total of 5 separate coaxial 18 gauge core biopsy samples were obtained and submitted in formalin. COMPLICATIONS: None FINDINGS: Large destructive mass involving the lateral and anterior scapula and destroying the glenoid and acromion  again noted. Solid tissue was obtained. IMPRESSION: CT-guided core biopsy performed of large destructive mass of the right scapula. Electronically Signed   By: Aletta Edouard M.D.   On: 02/08/2018 13:16    Labs:  CBC: Recent Labs    01/22/18 2004 02/08/18 1029 02/09/18 1121  WBC 17.9* 13.7*  --   HGB 12.9* 12.5* 12.6*  HCT 37.9* 39.3 37.0*  PLT 370 481*  --     COAGS: Recent Labs    02/08/18 1029  INR 1.15    BMP: Recent Labs    01/22/18 2004 02/09/18 1121  NA 135 140  K 2.7* 3.1*  CL 105  --   CO2 20*  --   GLUCOSE 201* 102*  BUN 15  --   CALCIUM 9.4  --   CREATININE 0.88  --   GFRNONAA >60  --   GFRAA >60  --     LIVER FUNCTION TESTS: No results for input(s): BILITOT, AST, ALT, ALKPHOS, PROT, ALBUMIN in the last 8760 hours.  TUMOR MARKERS: No results for input(s): AFPTM, CEA, CA199, CHROMGRNA in the last 8760 hours.  Assessment and Plan:  Metastatic lung cancer Bone and brain lesions Rt scapular lesion biopsy 11/19-- pending Scheduled for Port a cath today Risks and benefits of image guided port-a-catheter placement was discussed with the patient including, but not limited to bleeding, infection, pneumothorax, or fibrin sheath development and need for additional procedures.  All of the patient's questions were answered, patient is agreeable to proceed. Consent signed and in chart.   Thank you for this interesting consult.  I greatly enjoyed meeting Ian Roberts and look forward to participating in their care.  A copy of this report was sent to the requesting provider on this date.  Electronically Signed: Lavonia Drafts, PA-C 02/11/2018, 8:02 AM   I spent a total of    25 Minutes in face to face in clinical consultation, greater than 50% of which was counseling/coordinating care for [facility]

## 2018-02-14 ENCOUNTER — Ambulatory Visit (HOSPITAL_COMMUNITY): Payer: Self-pay | Admitting: Hematology

## 2018-02-16 ENCOUNTER — Other Ambulatory Visit (HOSPITAL_COMMUNITY): Payer: Self-pay | Admitting: Nurse Practitioner

## 2018-02-16 DIAGNOSIS — C3492 Malignant neoplasm of unspecified part of left bronchus or lung: Secondary | ICD-10-CM

## 2018-02-16 MED ORDER — ONDANSETRON 4 MG PO TBDP: 4.0000 mg | ORAL_TABLET | Freq: Three times a day (TID) | ORAL | 0 refills | Status: DC | PRN

## 2018-02-21 ENCOUNTER — Other Ambulatory Visit (HOSPITAL_COMMUNITY): Payer: Self-pay | Admitting: Nurse Practitioner

## 2018-02-21 DIAGNOSIS — C3492 Malignant neoplasm of unspecified part of left bronchus or lung: Secondary | ICD-10-CM

## 2018-02-21 MED ORDER — ONDANSETRON 4 MG PO TBDP: 4.0000 mg | ORAL_TABLET | Freq: Three times a day (TID) | ORAL | 0 refills | Status: DC | PRN

## 2018-02-22 ENCOUNTER — Other Ambulatory Visit: Payer: Self-pay

## 2018-02-22 ENCOUNTER — Encounter (HOSPITAL_COMMUNITY): Payer: Self-pay | Admitting: Hematology

## 2018-02-22 ENCOUNTER — Inpatient Hospital Stay (HOSPITAL_COMMUNITY): Payer: Self-pay | Attending: Hematology | Admitting: Hematology

## 2018-02-22 VITALS — BP 112/73 | HR 100 | Temp 97.9°F | Resp 18 | Wt 175.0 lb

## 2018-02-22 DIAGNOSIS — C7931 Secondary malignant neoplasm of brain: Secondary | ICD-10-CM | POA: Insufficient documentation

## 2018-02-22 DIAGNOSIS — C3492 Malignant neoplasm of unspecified part of left bronchus or lung: Secondary | ICD-10-CM

## 2018-02-22 DIAGNOSIS — Z7189 Other specified counseling: Secondary | ICD-10-CM | POA: Insufficient documentation

## 2018-02-22 DIAGNOSIS — C3432 Malignant neoplasm of lower lobe, left bronchus or lung: Secondary | ICD-10-CM | POA: Insufficient documentation

## 2018-02-22 DIAGNOSIS — C787 Secondary malignant neoplasm of liver and intrahepatic bile duct: Secondary | ICD-10-CM | POA: Insufficient documentation

## 2018-02-22 DIAGNOSIS — F17210 Nicotine dependence, cigarettes, uncomplicated: Secondary | ICD-10-CM | POA: Insufficient documentation

## 2018-02-22 DIAGNOSIS — R112 Nausea with vomiting, unspecified: Secondary | ICD-10-CM

## 2018-02-22 DIAGNOSIS — R590 Localized enlarged lymph nodes: Secondary | ICD-10-CM | POA: Insufficient documentation

## 2018-02-22 DIAGNOSIS — Z79899 Other long term (current) drug therapy: Secondary | ICD-10-CM | POA: Insufficient documentation

## 2018-02-22 DIAGNOSIS — C7951 Secondary malignant neoplasm of bone: Secondary | ICD-10-CM | POA: Insufficient documentation

## 2018-02-22 DIAGNOSIS — Z5111 Encounter for antineoplastic chemotherapy: Secondary | ICD-10-CM | POA: Insufficient documentation

## 2018-02-22 MED ORDER — FOLIC ACID 1 MG PO TABS: 1.0000 mg | ORAL_TABLET | Freq: Every day | ORAL | 2 refills | Status: DC

## 2018-02-22 MED ORDER — DEXAMETHASONE 4 MG PO TABS: 4.0000 mg | ORAL_TABLET | Freq: Every day | ORAL | 0 refills | Status: AC

## 2018-02-22 MED ORDER — CYANOCOBALAMIN 1000 MCG/ML IJ SOLN
1000.0000 ug | Freq: Once | INTRAMUSCULAR | Status: AC
  Administered 2018-02-22: 1000 ug via INTRAMUSCULAR
  Filled 2018-02-22: qty 1

## 2018-02-22 MED ORDER — SCOPOLAMINE 1 MG/3DAYS TD PT72
1.0000 | MEDICATED_PATCH | TRANSDERMAL | Status: DC
  Administered 2018-02-22: 1.5 mg via TRANSDERMAL
  Filled 2018-02-22: qty 1

## 2018-02-22 NOTE — Assessment & Plan Note (Signed)
1.  Metastatic adenocarcinoma of the lung, with brain mets: - Presentation to the ER on 01/22/2018 with right shoulder pain of one-month duration. - 60-pack-year smoker. -CT scan of the shoulder shows large destructive lytic lesion involving the glenoid and the coracoid. - CT CAP on 01/28/2018 shows 5.1 x 3.6 cm left lower lobe mass, left hilar and mediastinal lymphadenopathy, 13 mm low-attenuation lesion in the liver, large destructive bone lesion involving the right scapula, no evidence of other bone lesions, bilateral iliac and inguinal lymphadenopathy. - We discussed the results of the biopsy of the right scapular lesion dated 02/08/2018 which was consistent with metastatic adenocarcinoma.  CK7 was positive.  CDX 2 was positive and a subset of tumor cells.  CD20, TTF-1, Napsin-A, p63, CK 5/6, are negative. -I have reviewed literature.  This was consistent with enteric differentiation of lung cancer. -I also reviewed the results of the MRI of the brain dated 02/03/2018 which shows more than 20 metastasis measuring up to 7 mm throughout the brain.  There is mild edema associated with the larger lesions.  No mass-effect. -I will send his tumor for foundation 1 as well as PDL 1 testing. - He was started on radiation therapy on 02/14/2018.  He is receiving whole brain radiation therapy and to the right scapula.  He will finish it on 03/01/2018. -He is having intractable nausea from the radiation.  He is taking Compazine and Zofran.  This is not helping.  We will put a scopolamine patch on him today.  If it helps we will call him a prescription.  I will also start him on 4 mg of dexamethasone in the mornings. -We talked about starting him on chemotherapy with carboplatin, pemetrexed and pembrolizumab pending foundation 1 results.  For cycle, we will just give carboplatin and pemetrexed.  We talked about side effects in detail.  We will give her B12 injection.  We will call in prescription for folic acid  today.  Tentative start date is next Wednesday.  2.  Right shoulder pain: - Pain is described as sharp and constant, did not improve with Percocet. - He will continue morphine 30 mg every 6 hours.

## 2018-02-22 NOTE — Progress Notes (Signed)
START ON PATHWAY REGIMEN - Non-Small Cell Lung     A cycle is every 21 days:     Pembrolizumab      Pemetrexed      Carboplatin   **Always confirm dose/schedule in your pharmacy ordering system**  Patient Characteristics: Stage IV Metastatic, Nonsquamous, Initial Chemotherapy/Immunotherapy, PS = 0, 1, ALK Translocation Negative/Unknown and EGFR Mutation Negative/Non-Sensitizing/Unknown, PD-L1 Expression Positive 1-49% (TPS) / Negative / Not Tested / Awaiting Test Results  and Immunotherapy Candidate AJCC T Category: TX Current Disease Status: Distant Metastases AJCC N Category: NX AJCC M Category: M1c AJCC 8 Stage Grouping: IVB Histology: Nonsquamous Cell ROS1 Rearrangement Status: Awaiting Test Results T790M Mutation Status: Not Applicable - EGFR Mutation Negative/Unknown Other Mutations/Biomarkers: No Other Actionable Mutations NTRK Gene Fusion Status: Awaiting Test Results PD-L1 Expression Status: Awaiting Test Results Chemotherapy/Immunotherapy LOT: Initial Chemotherapy/Immunotherapy Molecular Targeted Therapy: Not Appropriate ALK Translocation Status: Awaiting Test Results EGFR Mutation Status: Awaiting Test Results BRAF V600E Mutation Status: Awaiting Test Results Performance Status: PS = 0, 1 Immunotherapy Candidate Status: Candidate for Immunotherapy Intent of Therapy: Non-Curative / Palliative Intent, Discussed with Patient

## 2018-02-22 NOTE — Progress Notes (Signed)
[place] [employer], [place] [address]   CLINIC:  Medical Oncology/Hematology  PCP:  Neale Burly, MD 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  CURRENT THERAPY: radiation started 02/14/18   INTERVAL HISTORY:  Ian Roberts 57 y.o. 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 Patient is here today with his wife and daughter. He is in the middle of radiation treatments. He is still having pain but it is reduced with the medication. The radiation is causing nausea and vomiting and his appetite is greatly reduced. He has nausea medications but it is not working well for him. He denies any bleeding. Denies any new cough or hemoptysis. Denies any fevers or recent infection. He reports his appetite at 0% and his energy level at 50%.     REVIEW OF SYSTEMS:  Review of Systems  Gastrointestinal: Positive for constipation, nausea and vomiting.  Musculoskeletal: Positive for back pain.  All other systems reviewed and are negative.    PAST MEDICAL/SURGICAL HISTORY:  Past Medical History:  Diagnosis Date  . Bone lesion    right shoulder found on x-ray  . CAD (coronary artery disease)   . Dyslipidemia   . Hypertension   . Hypokalemia   . Necrotizing fasciitis ([place])   . Rotator cuff tear   . ST elevation myocardial infarction (STEMI) of inferior wall ([facility])    Assocaited with paroxysmal atrial fibrillation 05/13/2008; Xience drug eluting stent  . Stroke [facility])    Past Surgical History:  Procedure Laterality Date  . CORONARY ANGIOPLASTY WITH STENT PLACEMENT    . CYST EXCISION    . IR IMAGING GUIDED PORT INSERTION  02/11/2018  . TONSILLECTOMY       SOCIAL HISTORY:  Social History   Socioeconomic History  . Marital status: Married    Spouse name: Not on file  . Number of children: 2    . Years of education: Not on file  . Highest education level: Not on file  Occupational History  . Occupation: Full time Dealer    Comment: unemployed  Social Needs  . Financial resource strain: Not very hard  . Food insecurity:    Worry: Never true    Inability: Never true  . Transportation needs:    Medical: No    Non-medical: No  Tobacco Use  . Smoking status: Current Every Day Smoker    Packs/day: 1.50    Years: 41.00    Pack years: 61.50    Types: Cigarettes  . Smokeless tobacco: Never Used  Substance and Sexual Activity  . Alcohol use: Never    Frequency: Never  . Drug use: Never  . Sexual activity: Not Currently  Lifestyle  . Physical activity:    Days per week: 0 days    Minutes per session: 0 min  . Stress: To some extent  Relationships  . Social connections:    Talks on phone: More than three times a week    Gets together: More than three times a week    Attends religious service: Never    Active member of club or organization: No    Attends meetings of clubs or organizations: Never    Relationship status: Married  . Intimate partner violence:    Fear of current or ex partner: No    Emotionally abused: No  Physically abused: No    Forced sexual activity: No  Other Topics Concern  . Not on file  Social History Narrative   Married    FAMILY HISTORY:  Family History  Problem Relation Age of Onset  . Diabetes Mother   . Hypertension Mother   . Lung cancer Father     CURRENT MEDICATIONS:  Outpatient Encounter Medications as of 02/22/2018  Medication Sig  . morphine (MSIR) 30 MG tablet Take 1 tablet (30 mg total) by mouth every 6 (six) hours as needed for severe pain.  Marland Kitchen ondansetron (ZOFRAN ODT) 4 MG disintegrating tablet Take 1 tablet (4 mg total) by mouth every 8 (eight) hours as needed for nausea or vomiting.  . prochlorperazine (COMPAZINE) 10 MG tablet Take 1 tablet (10 mg total) by mouth every 6 (six) hours as needed for nausea or vomiting.   Marland Kitchen amLODipine (NORVASC) 10 MG tablet Take 10 mg by mouth daily.   Marland Kitchen aspirin EC 325 MG tablet Take 325 mg by mouth daily.   Marland Kitchen dexamethasone (DECADRON) 4 MG tablet Take 1 tablet (4 mg total) by mouth daily.  Marland Kitchen docusate sodium (COLACE) 100 MG capsule Take 100 mg by mouth daily.   . folic acid (FOLVITE) 1 MG tablet Take 1 tablet (1 mg total) by mouth daily.  . hydrochlorothiazide (HYDRODIURIL) 25 MG tablet Take 25 mg by mouth daily.   Marland Kitchen ibuprofen (ADVIL,MOTRIN) 200 MG tablet Take 600 mg by mouth every 6 (six) hours as needed for headache or moderate pain.   Marland Kitchen lisinopril (PRINIVIL,ZESTRIL) 20 MG tablet Take 20 mg by mouth daily.  . memantine (NAMENDA) 5 MG tablet TAKE 1 TABLET IN THE MORNING FOR 7 DAYS, 1 TABLET IN THE MORNING & 1 TABLET IN THE EVENING FOR 7 DAYS, 2 TABS IN THE MORNING & 1 TABLET IN T  . simvastatin (ZOCOR) 20 MG tablet Take 20 mg by mouth daily.   . [DISCONTINUED] potassium chloride 20 MEQ TBCR Take 20 mEq by mouth 3 (three) times daily.   Facility-Administered Encounter Medications as of 02/22/2018  Medication  . [COMPLETED] cyanocobalamin ((VITAMIN B-12)) injection 1,000 mcg  . scopolamine (TRANSDERM-SCOP) 1 MG/3DAYS 1.5 mg    ALLERGIES:  Allergies  Allergen Reactions  . Hydrocodone Hives     PHYSICAL EXAM:  ECOG Performance status: 1  Vitals:   02/22/18 0910  BP: 112/73  Pulse: 100  Resp: 18  Temp: 97.9 F (36.6 C)  SpO2: 99%   Filed Weights   02/22/18 0910  Weight: 175 lb (79.4 kg)    Physical Exam  Constitutional: He is oriented to person, place, and time. He appears well-developed and well-nourished.  Cardiovascular: Normal rate, regular rhythm and normal heart sounds.  Pulmonary/Chest: Effort normal and breath sounds normal.  Musculoskeletal: Normal range of motion.  Neurological: He is alert and oriented to person, place, and time.  Skin: Skin is warm and dry.  Psychiatric: He has a normal mood and affect. His behavior is normal. Judgment and  thought content normal.     LABORATORY DATA:  I have reviewed the labs as listed.  CBC    Component Value Date/Time   WBC 11.3 (H) 02/11/2018 0752   RBC 4.09 (L) 02/11/2018 0752   HGB 11.6 (L) 02/11/2018 0752   HCT 36.1 (L) 02/11/2018 0752   PLT 362 02/11/2018 0752   MCV 88.3 02/11/2018 0752   MCH 28.4 02/11/2018 0752   MCHC 32.1 02/11/2018 0752   RDW 13.9 02/11/2018 0752   LYMPHSABS  1.4 01/22/2018 2004   MONOABS 0.8 01/22/2018 2004   EOSABS 0.1 01/22/2018 2004   BASOSABS 0.0 01/22/2018 2004   CMP Latest Ref Rng & Units 02/09/2018 01/22/2018 05/15/2008  Glucose 70 - 99 mg/dL 102(H) 201(H) 119(H)  BUN 6 - 20 mg/dL - 15 9  Creatinine 0.61 - 1.24 mg/dL - 0.88 0.76  Sodium 135 - 145 mmol/L 140 135 135  Potassium 3.5 - 5.1 mmol/L 3.1(L) 2.7(LL) 3.2(L)  Chloride 98 - 111 mmol/L - 105 105  CO2 22 - 32 mmol/L - 20(L) 23  Calcium 8.9 - 10.3 mg/dL - 9.4 8.1(L)       DIAGNOSTIC IMAGING:  I have independently reviewed the scans and discussed with the patient.  I have reviewed Francene Finders, NP's note and agree with the documentation.  I personally performed a face-to-face visit, made revisions and my assessment and plan is as follows.     ASSESSMENT & PLAN:   Metastatic lung cancer (metastasis from lung to other site), left ([place]) 1.  Metastatic adenocarcinoma of the lung, with brain mets: - Presentation to the ER on 01/22/2018 with right shoulder pain of one-month duration. - 60-pack-year smoker. -CT scan of the shoulder shows large destructive lytic lesion involving the glenoid and the coracoid. - CT CAP on 01/28/2018 shows 5.1 x 3.6 cm left lower lobe mass, left hilar and mediastinal lymphadenopathy, 13 mm low-attenuation lesion in the liver, large destructive bone lesion involving the right scapula, no evidence of other bone lesions, bilateral iliac and inguinal lymphadenopathy. - We discussed the results of the biopsy of the right scapular lesion dated 02/08/2018 which was  consistent with metastatic adenocarcinoma.  CK7 was positive.  CDX 2 was positive and a subset of tumor cells.  CD20, TTF-1, Napsin-A, p63, CK 5/6, are negative. -I have reviewed literature.  This was consistent with enteric differentiation of lung cancer. -I also reviewed the results of the MRI of the brain dated 02/03/2018 which shows more than 20 metastasis measuring up to 7 mm throughout the brain.  There is mild edema associated with the larger lesions.  No mass-effect. -I will send his tumor for foundation 1 as well as PDL 1 testing. - He was started on radiation therapy on 02/14/2018.  He is receiving whole brain radiation therapy and to the right scapula.  He will finish it on 03/01/2018. -He is having intractable nausea from the radiation.  He is taking Compazine and Zofran.  This is not helping.  We will put a scopolamine patch on him today.  If it helps we will call him a prescription.  I will also start him on 4 mg of dexamethasone in the mornings. -We talked about starting him on chemotherapy with carboplatin, pemetrexed and pembrolizumab pending foundation 1 results.  For cycle, we will just give carboplatin and pemetrexed.  We talked about side effects in detail.  We will give her B12 injection.  We will call in prescription for folic acid today.  Tentative start date is next Wednesday.  2.  Right shoulder pain: - Pain is described as sharp and constant, did not improve with Percocet. - He will continue morphine 30 mg every 6 hours.       Orders placed this encounter:  Orders Placed This Encounter  Procedures  . CBC with Differential/Platelet  . Comprehensive metabolic panel      Ian Jack, MD [place] [phone]

## 2018-02-22 NOTE — Progress Notes (Signed)
Information provided to patient about the chemotherapy agents he is scheduled to receive next week.  He was given a brief overview and was allowed time to ask questions.  His wife and daughter were as well.    Vitamin B-12 1000 mcg given IM into left deltoid. Patient tolerated procedure without incidence. Patient discharged ambulatory and in stable condition from clinic.

## 2018-02-22 NOTE — Patient Instructions (Signed)
[facility] at [facility] Discharge Instructions     Thank you for choosing [facility] at [facility] to provide your oncology and hematology care.  To afford each patient quality time with our provider, please arrive at least 15 minutes before your scheduled appointment time.   If you have a lab appointment with the Cancer Center please come in thru the  Main Entrance and check in at the main information desk  You need to re-schedule your appointment should you arrive 10 or more minutes late.  We strive to give you quality time with our providers, and arriving late affects you and other patients whose appointments are after yours.  Also, if you no show three or more times for appointments you may be dismissed from the clinic at the providers discretion.     Again, thank you for choosing [facility].  Our hope is that these requests will decrease the amount of time that you wait before being seen by our physicians.       _____________________________________________________________  Should you have questions after your visit to [facility], please contact our office at [phone] between the hours of 8:00 a.m. and 4:30 p.m.  Voicemails left after 4:00 p.m. will not be returned until the following business day.  For prescription refill requests, have your pharmacy contact our office and allow 72 hours.    Cancer Center Support Programs:   > Cancer Support Group  2nd Tuesday of the month 1pm-2pm, Journey Room    

## 2018-02-24 ENCOUNTER — Other Ambulatory Visit (HOSPITAL_COMMUNITY): Payer: Self-pay | Admitting: *Deleted

## 2018-02-24 MED ORDER — SCOPOLAMINE 1 MG/3DAYS TD PT72: 1.0000 | MEDICATED_PATCH | TRANSDERMAL | 2 refills | Status: AC

## 2018-02-24 MED ORDER — LIDOCAINE-PRILOCAINE 2.5-2.5 % EX CREA: TOPICAL_CREAM | CUTANEOUS | 3 refills | Status: DC

## 2018-02-24 NOTE — Patient Instructions (Signed)
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  We are going to be treating you with chemotherapy and immunotherapy with palliative intent. This means that your cancer is not curable but is treatable.  We will be giving you treatment every 21 days through your port a cath.  You will be getting Paraplatin (carboplatin) Alimta (pemetrexed) and Keytruda (pembrolizumab).  You will see the doctor regularly throughout treatment.  We monitor your lab work prior to every treatment. The doctor monitors your response to treatment by the way you are feeling, your blood work, and scans periodically.  There will be wait times while you are here for treatment.  It will take about 30 minutes to 1 hour for your lab work to result.  Then there will be wait times while pharmacy mixes your medications.   You must take folic acid every day by mouth beginning 7 days before your first dose of alimta.  You must keep taking folic acid every day during the time you are being treated with alimta, and for every day for 21 days after you receive your last alimta dose.    You will get B12 injections while you are on alimta.  The first one about 1 week prior to starting treatment and then about every 9 weeks during treatment.    You will receive the following pre-medications prior to receiving chemotherapy:  Aloxi (IV push)- high powered nausea/vomiting prevention medication used for chemotherapy patients.   Emend - high powered nausea/vomiting prevention medication used for chemotherapy patients.  Carboplatin (Generic Name) Other Names: Paraplatin, CBDCA  About This Drug Carboplatin is a drug used to treat cancer. This drug is given in the vein (IV).  Takes 30 minutes to infuse.   Possible Side Effects (More Common) . Nausea and throwing up (vomiting). These symptoms may happen within a few hours after your treatment and may  last up to 24 hours. Medicines are available to stop or lessen these side effects. . Bone marrow depression. This is a decrease in the number of white blood cells, red blood cells, and platelets. This may raise your risk of infection, make you tired and weak (fatigue), and raise your risk of bleeding. . Soreness of the mouth and throat. You may have red areas, white patches, or sores that hurt. . This drug may affect how your kidneys work. Your kidney function will be checked as needed. . Electrolyte changes. Your blood will be checked for electrolyte changes as needed.  Possible Side Effects (Less Common) . Hair loss. Some patients lose their hair on the scalp and body. You may notice your hair thinning seven to 14 days after getting this drug. . Effects on the nerves are called peripheral neuropathy. You may feel numbness, tingling, or pain in your hands and feet. It may be hard for you to button your clothes, open jars, or walk as usual. The effect on the nerves may get worse with more doses of the drug. These effects get better in some people after the drug is stopped but it does not get better in all people. . Loose bowel movements (diarrhea) that may last for several days . Decreased hearing or ringing in the ears . Changes in the way food and drinks taste . Changes in liver function. Your liver function will be checked as needed  Allergic Reactions Serious allergic reactions including anaphylaxis are rare. While you are getting  this drug in your vein (IV), tell your nurse right away if you have any of these symptoms of an allergic reaction: . Trouble catching your breath . Feeling like your tongue or throat are swelling . Feeling your heart beat quickly or in a not normal way (palpitations) . Feeling dizzy or lightheaded . Flushing, itching, rash, and/or hives  Treating Side Effects . Drink 6-8 cups of fluids each day unless your doctor has told you to limit your fluid intake due to  some other health problem. A cup is 8 ounces of fluid. If you throw up or have loose bowel movements, you should drink more fluids so that you do not become dehydrated (lack water in the body from losing too much fluid). . Mouth care is very important. Your mouth care should consist of routine, gentle cleaning of your teeth or dentures and rinsing your mouth with a mixture of 1/2 teaspoon of salt in 8 ounces of water or  teaspoon of baking soda in 8 ounces of water. This should be done at least after each meal and at bedtime. . If you have mouth sores, avoid mouthwash that has alcohol. Avoid alcohol and smoking because they can bother your mouth and throat. . If you have numbness and tingling in your hands and feet, be careful when cooking, walking, and handling sharp objects and hot liquids. . Talk with your nurse about getting a wig before you lose your hair. Also, call the [employer] at 800-ACS-2345 to find out information about the "Look Good, Feel Better" program close to where you live. It is a free program where women getting chemotherapy can learn about wigs, turbans and scarves as well as makeup techniques and skin and nail care.  Food and Drug Interactions There are no known interactions of carboplatin with food. This drug may interact with other medicines. Tell your doctor and pharmacist about all the medicines and dietary supplements (vitamins, minerals, herbs and others) that you are taking at this time. The safety and use of dietary supplements and alternative diets are often not known. Using these might affect your cancer or interfere with your treatment. Until more is known, you should not use dietary supplements or alternative diets without your cancer doctor's help.  When to Call the Doctor Call your doctor or nurse right away if you have any of these symptoms: . Fever of 100.5 F (38 C) or above; chills . Bleeding or bruising that is not normal . Wheezing or trouble  breathing . Nausea that stops you from eating or drinking . Throwing up more than once a day . Rash or itching . Loose bowel movements (diarrhea) more than four times a day or diarrhea with weakness or feeling lightheaded . Call your doctor or nurse as soon as possible if any of these symptoms happen: . Numbness, tingling, decreased feeling or weakness in fingers, toes, arms, or legs . Change in hearing, ringing in the ears . Blurred vision or other changes in eyesight . Decreased urine . Yellowing of skin or eyes  Sexual Problems and Reproductive Concerns Sexual problems and reproduction concerns may happen. In both men and women, this drug may affect your ability to have children. This cannot be determined before your treatment. Talk with your doctor or nurse if you plan to have children. Ask for information on sperm or egg banking. In men, this drug may interfere with your ability to make sperm, but it should not change your ability to have sexual  relations. In women, menstrual bleeding may become irregular or stop while you are getting this drug. Do not assume that you cannot become pregnant if you do not have a menstrual period. Women may go through signs of menopause (change of life) like vaginal dryness or itching. Vaginal lubricants can be used to lessen vaginal dryness, itching, and pain during sexual relations. Genetic counseling is available for you to talk about the effects of this drug therapy on future pregnancies. Also, a genetic counselor can look at the possible risk of problems in the unborn baby due to this medicine if an exposure happens during pregnancy. . Pregnancy warning: This drug may have harmful effects on the unborn child, so effective methods of birth control should be used during your cancer treatment. . Breast feeding warning: It is not known if this drug passes into breast milk. For this reason, women should talk to their doctor about the risks and benefits of breast  feeding during treatment with this drug because this drug may enter the breast milk and badly harm a breast feeding baby.   Pemetrexed (Generic Name) Other Names: ALIMTA  About This Drug Pemetrexed is used to treat cancer. It is given in the vein (IV).  Takes 10 minutes to infuse.   Possible Side Effects (More Common) . Bone marrow depression. This is a decrease in the number of white blood cells, red blood cells, and platelets. This may raise your risk of infection, make you tired and weak (fatigue), and raise your risk of bleeding. . Fatigue . Soreness of the mouth and throat. You may have red areas, white patches, or sores that hurt.  Possible Side Effects (less common) . Trouble breathing or feeling short of breath . Nausea and throwing up (vomiting) . Skin rash  Treating Side Effects . Ask your doctor or nurse about medicine that is available to help stop or lessen nausea and throwing up. . If you get a rash, do not put anything on it unless your doctor or nurse says you may. Keep the area around the rash clean and dry. . Mouth care is very important. Your mouth care should consist of routine, gentle cleaning of your teeth or dentures and rinsing your mouth with a mixture of 1/2 teaspoon of salt in 8 ounces of water or  teaspoon of baking soda in 8 ounces of water. This should be done at least after each meal and at bedtime. . If you have mouth sores, avoid mouthwash that has alcohol. Avoid alcohol and smoking because they can bother your mouth and throat.  Food and Drug Interactions There are no known interactions of this medicine with food. Tell your doctor if you are taking ibuprofen. Pemetrexed may interact with other medicines. Tell your doctor and pharmacist about all the medicines and dietary supplements (vitamins, minerals, herbs and others) that you are taking at this time. The safety and use of dietary supplements and alternative diets are often not known. Using these  might affect your cancer or interfere with your treatment. Until more is known, you should not use dietary supplements or alternative diets without your cancer doctor's help.  When to Call the Doctor Call your doctor or nurse right away if you have any of these symptoms: . Temperature of 100.5 F (38 C) or above . Chills . Easy bruising or bleeding . Trouble breathing . Chest pain . Nausea that stops you from eating or drinking . Throwing up more than 3 times in a day Call  your doctor or nurse as soon as possible if you have any of these symptoms: . Rash that does not go away with prescribed medicine . Nausea or vomiting that does not go away with prescribed medicine . Extreme fatigue that interferes with normal activities  Reproduction Concerns . Pregnancy warning: This drug may have harmful effects on the unborn child, so effective methods of birth control should be used during your cancer treatment. . Genetic counseling is available for you to talk about the effects of this drug therapy on future pregnancies. Also, a genetic counselor can look at the possible risk of problems in the unborn baby due to this medicine if an exposure happens during pregnancy. . Breast feeding warning: It is not known if this drug passes into breast milk. For this reason, women should talk to their doctor about the risks and benefits of breast feeding during treatment with this drug because this drug may enter the breast milk and badly harm a breast feeding baby.  Pembrolizumab Beryle Flock)  About This Drug Pembrolizumab is used to treat cancer. It is given in the vein (IV).  This drug will take 30 minutes to infuse.  Possible Side Effects . Tiredness . Fever . Nausea . Decreased appetite (decreased hunger) . Loose bowel movements (diarrhea) . Constipation (not able to move bowels) . Trouble breathing . Rash . Itching . Muscle and bone pain . Cough Note: Each of the side effects above was reported  in 20% or greater of patients treated with pembrolizumab. Not all possible side effects are included above.  Warnings and Precautions . This drug works with your immune system and can cause inflammation in any of your organs and tissues and can change how they work. This may put you at risk for developing serious medical problems which can very rarely be fatal. . Colitis (swelling (inflammation) in the colon) - symptoms are loose bowel movements (diarrhea) stomach cramping, and sometimes blood in the bowel movements . Changes in liver function. Your liver function will be checked as needed. . Changes in kidney function, which can very rarely be fatal. Your kidney function will be checked as needed. . Inflammation (swelling) of the lungs which can very rarely be fatal - you may have a dry cough or trouble breathing. . This drug may affect some of your hormone glands (especially the thyroid, adrenals, pituitary and pancreas). Your hormone levels will be checked as needed. . Blood sugar levels may change and you may develop diabetes. If you already have diabetes, changes may need to be made to your diabetes medication. . Severe allergic skin reaction, which can very rarely be fatal. You may develop blisters on your skin that are filled with fluid or a severe red rash all over your body that may be painful. . Increased risk of organ rejection in patients who have received donor organs . Increased risk of complications in patients who will undergo a stem cell transplant after receiving pembrolizumab. . While you are getting this drug in your vein (IV), you may have a reaction to the drug. Your nurse will check you closely for these signs: fever or shaking chills, flushing, facial swelling, feeling dizzy, headache, trouble breathing, rash, itching, chest tightness, or chest pain. These reactions may occur after your infusion. If this happens, call 911 for emergency care.  Important Information . This drug  may be present in the saliva, tears, sweat, urine, stool, vomit, semen, and vaginal secretions. Talk to your doctor and/or your nurse about  the necessary precautions to take during this time.  Treating Side Effects . Ask your doctor or nurse about medicines that are available to help stop or lessen constipation, diarrhea and/or nausea. . Drink plenty of fluids (a minimum of eight glasses per day is recommended). . If you are not able to move your bowels, check with your doctor or nurse before you use any enemas, laxatives, or suppositories . To help with nausea and vomiting, eat small, frequent meals instead of three large meals a day. Choose foods and drinks that are at room temperature. Ask your nurse or doctor about other helpful tips and medicine that is available to help or stop lessen these symptoms. . If you get diarrhea, eat low-fiber foods that are high in protein and calories and avoid foods that can irritate your digestive tracts or lead to cramping. Ask your nurse or doctor about medicine that can lessen or stop your diarrhea. . Manage tiredness by pacing your activities for the day. Be sure to include periods of rest between energy-draining activities . Keeping your pain under control is important to your wellbeing. Please tell your doctor or nurse if you are experiencing pain. . If you have diabetes, keep good control of your blood sugar level. Tell your nurse or your doctor if your glucose levels are higher or lower than normal . If you get a rash do not put anything on it unless your doctor or nurse says you may. Keep the area around the rash clean and dry. Ask your doctor for medicine if your rash bothers you. . Infusion reactions may happen for 24 hours after your infusion. If this happens, call 911 for emergency care.  Food and Drug Interactions . There are no known interactions of pembrolizumab with food. . There are no known interactions of pembrolizumab with other  medications. . Tell your doctor and pharmacist about all the medicines and dietary supplements (vitamins, minerals, herbs and others) that you are taking at this time. The safety and use of dietary supplements and alternative agents are often not known. Using these might affect your cancer or interfere with your treatment. Until more is known, you should not use dietary supplements or alternative agents without your cancer doctor's help.   When to Call the Doctor Call your doctor or nurse if you have any of the following symptoms and/or any new or unusual symptoms: . Fever of 100.5 F (38 C) or higher . Chills . Wheezing or trouble breathing . Rash or itching . Feeling dizzy or lightheaded . Loose bowel movements (diarrhea) more than 4 times a day or diarrhea with weakness or lightheadedness . Nausea that stops you from eating or drinking, and/or that is not relieved by prescribed medicines . Lasting loss of appetite or rapid weight loss of five pounds in a week . Fatigue that interferes with your daily activities . No bowel movement for 3 days or you feel uncomfortable . Extreme weakness that interferes with normal activities . Bad abdominal pain, especially in upper right area . Decreased urine . Unusual thirst or passing urine often . Rash that is not relieved by prescribed medicines . Flu-like symptoms: fever, headache, muscle and joint aches, and fatigue (low energy, feeling weak) . Signs of liver problems: dark urine, pale bowel movements, bad stomach pain, feeling very tired and weak, unusual itching, or yellowing of the eyes or skin . Signs of infusion reactions such as fever or shaking chills, flushing, facial swelling, feeling dizzy, headache, trouble breathing,  rash, itching, chest tightness, or chest pain. . If you think you are pregnant  Reproduction Warnings . Pregnancy warning: This drug may have harmful effects on the unborn baby. Women of childbearing potential should use  effective methods of birth control during your cancer treatment and for at least 4 months after treatment. Let your doctor know right away if you think you may be pregnant . Breast feeding warning: It is not known if this drug passes into breast milk. It is recommended that women do not breastfeed during treatment and for 4 months after treatment. . Fertility warning: Human fertility studies have not been done with this drug. Talk with your doctor or nurse if you plan to have children.  SELF CARE ACTIVITIES WHILE ON CHEMOTHERAPY:  Hydration Increase your fluid intake 48 hours prior to treatment and drink at least 8 to 12 cups (64 ounces) of water/decaffeinated beverages per day after treatment. You can still have your cup of coffee or soda but these beverages do not count as part of your 8 to 12 cups that you need to drink daily. No alcohol intake.  Medications Continue taking your normal prescription medication as prescribed.  If you start any new herbal or new supplements please let us know first to make sure it is safe.  Mouth Care Have teeth cleaned professionally before starting treatment. Keep dentures and partial plates clean. Use soft toothbrush and do not use mouthwashes that contain alcohol. Biotene is a good mouthwash that is available at most pharmacies or may be ordered by calling [phone]. Use warm salt water gargles (1 teaspoon salt per 1 quart warm water) before and after meals and at bedtime. Or you may rinse with 2 tablespoons of three-percent hydrogen peroxide mixed in eight ounces of water. If you are still having problems with your mouth or sores in your mouth please call the clinic. If you need dental work, please let the doctor know before you go for your appointment so that we can coordinate the best possible time for you in regards to your chemo regimen. You need to also let your dentist know that you are actively taking chemo. We may need to do labs prior to your dental  appointment.  Skin Care Always use sunscreen that has not expired and with SPF (Sun Protection Factor) of 50 or higher. Wear hats to protect your head from the sun. Remember to use sunscreen on your hands, ears, face, & feet.  Use good moisturizing lotions such as udder cream, eucerin, or even Vaseline. Some chemotherapies can cause dry skin, color changes in your skin and nails.    . Avoid long, hot showers or baths. . Use gentle, fragrance-free soaps and laundry detergent. . Use moisturizers, preferably creams or ointments rather than lotions because the thicker consistency is better at preventing skin dehydration. Apply the cream or ointment within 15 minutes of showering. Reapply moisturizer at night, and moisturize your hands every time after you wash them.  Hair Loss (if your doctor says your hair will fall out)  . If your doctor says that your hair is likely to fall out, decide before you begin chemo whether you want to wear a wig. You may want to shop before treatment to match your hair color. . Hats, turbans, and scarves can also camouflage hair loss, although some people prefer to leave their heads uncovered. If you go bare-headed outdoors, be sure to use sunscreen on your scalp. . Cut your hair short. It eases the  inconvenience of shedding lots of hair, but it also can reduce the emotional impact of watching your hair fall out. . Don't perm or color your hair during chemotherapy. Those chemical treatments are already damaging to hair and can enhance hair loss. Once your chemo treatments are done and your hair has grown back, it's OK to resume dyeing or perming hair. With chemotherapy, hair loss is almost always temporary. But when it grows back, it may be a different color or texture. In older adults who still had hair color before chemotherapy, the new growth may be completely gray.  Often, new hair is very fine and soft.  Infection Prevention Please wash your hands for at least 30  seconds using warm soapy water. Handwashing is the #1 way to prevent the spread of germs. Stay away from sick people or people who are getting over a cold. If you develop respiratory systems such as green/yellow mucus production or productive cough or persistent cough let us know and we will see if you need an antibiotic. It is a good idea to keep a pair of gloves on when going into grocery stores/[employer] to decrease your risk of coming into contact with germs on the carts, etc. Carry alcohol hand gel with you at all times and use it frequently if out in public. If your temperature reaches 100.5 or higher please call the clinic and let us know.  If it is after hours or on the weekend please go to the ER if your temperature is over 100.5.  Please have your own personal thermometer at home to use.    Sex and bodily fluids If you are going to have sex, a condom must be used to protect the person that isn't taking chemotherapy. Chemo can decrease your libido (sex drive). For a few days after chemotherapy, chemotherapy can be excreted through your bodily fluids.  When using the toilet please close the lid and flush the toilet twice.  Do this for a few day after you have had chemotherapy.   Effects of chemotherapy on your sex life Some changes are simple and won't last long. They won't affect your sex life permanently. Sometimes you may feel: . too tired . not strong enough to be very active . sick or sore  . not in the mood . anxious or low Your anxiety might not seem related to sex. For example, you may be worried about the cancer and how your treatment is going. Or you may be worried about money, or about how you family are coping with your illness. These things can cause stress, which can affect your interest in sex. It's important to talk to your partner about how you feel. Remember - the changes to your sex life don't usually last long. There's usually no medical reason to stop having sex during  chemo. The drugs won't have any long term physical effects on your performance or enjoyment of sex. Cancer can't be passed on to your partner during sex  Contraception It's important to use reliable contraception during treatment. Avoid getting pregnant while you or your partner are having chemotherapy. This is because the drugs may harm the baby. Sometimes chemotherapy drugs can leave a man or woman infertile.  This means you would not be able to have children in the future. You might want to talk to someone about permanent infertility. It can be very difficult to learn that you may no longer be able to have children. Some people find counselling helpful. There  might be ways to preserve your fertility, although this is easier for men than for women. You may want to speak to a fertility expert. You can talk about sperm banking or harvesting your eggs. You can also ask about other fertility options, such as donor eggs. If you have or have had breast cancer, your doctor might advise you not to take the contraceptive pill. This is because the hormones in it might affect the cancer.  It is not known for sure whether or not chemotherapy drugs can be passed on through semen or secretions from the vagina. Because of this some doctors advise people to use a barrier method if you have sex during treatment. This applies to vaginal, anal or oral sex. Generally, doctors advise a barrier method only for the time you are actually having the treatment and for about a week after your treatment. Advice like this can be worrying, but this does not mean that you have to avoid being intimate with your partner. You can still have close contact with your partner and continue to enjoy sex.  Animals If you have cats or birds we just ask that you not change the litter or change the cage.  Please have someone else do this for you while you are on chemotherapy.   Food Safety During and After Cancer Treatment Food safety is  important for people both during and after cancer treatment. Cancer and cancer treatments, such as chemotherapy, radiation therapy, and stem cell/bone marrow transplantation, often weaken the immune system. This makes it harder for your body to protect itself from foodborne illness, also called food poisoning. Foodborne illness is caused by eating food that contains harmful bacteria, parasites, or viruses.  Foods to avoid Some foods have a higher risk of becoming tainted with bacteria. These include: Marland Kitchen Unwashed fresh fruit and vegetables, especially leafy vegetables that can hide dirt and other contaminants . Raw sprouts, such as alfalfa sprouts . Raw or undercooked beef, especially ground beef, or other raw or undercooked meat and poultry . Fatty, fried, or spicy foods immediately before or after treatment.  These can sit heavy on your stomach and make you feel nauseous. . Raw or undercooked shellfish, such as oysters. . Sushi and sashimi, which often contain raw fish.  . Unpasteurized beverages, such as unpasteurized fruit juices, raw milk, raw yogurt, or cider . Undercooked eggs, such as soft boiled, over easy, and poached; raw, unpasteurized eggs; or foods made with raw egg, such as homemade raw cookie dough and homemade mayonnaise Simple steps for food safety Shop smart. . Do not buy food stored or displayed in an unclean area. . Do not buy bruised or damaged fruits or vegetables. . Do not buy cans that have cracks, dents, or bulges. . Pick up foods that can spoil at the end of your shopping trip and store them in a cooler on the way home. Prepare and clean up foods carefully. . Rinse all fresh fruits and vegetables under running water, and dry them with a clean towel or paper towel. . Clean the top of cans before opening them. . After preparing food, wash your hands for 20 seconds with hot water and soap. Pay special attention to areas between fingers and under nails. . Clean your  utensils and dishes with hot water and soap. Marland Kitchen Disinfect your kitchen and cutting boards using 1 teaspoon of liquid, unscented bleach mixed into 1 quart of water.   Dispose of old food. . Eat canned and packaged  food before its expiration date (the "use by" or "best before" date). . Consume refrigerated leftovers within 3 to 4 days. After that time, throw out the food. Even if the food does not smell or look spoiled, it still may be unsafe. Some bacteria, such as Listeria, can grow even on foods stored in the refrigerator if they are kept for too long. Take precautions when eating out. . At restaurants, avoid buffets and salad bars where food sits out for a long time and comes in contact with many people. Food can become contaminated when someone with a virus, often a norovirus, or another "bug" handles it. . Put any leftover food in a "to-go" container yourself, rather than having the server do it. And, refrigerate leftovers as soon as you get home. . Choose restaurants that are clean and that are willing to prepare your food as you order it cooked.   MEDICATIONS:                                                                                                                                                                Compazine/Prochlorperazine 10mg  tablet. Take 1 tablet every 6 hours as needed for nausea/vomiting. (This can make you sleepy)   EMLA cream. Apply a quarter size amount to port site 1 hour prior to chemo. Do not rub in. Cover with plastic wrap.   Over-the-Counter Meds:  Colace - 100 mg capsules - take 2 capsules daily.  If this doesn't help then you can increase to 2 capsules twice daily.  Call us if this does not help your bowels move.   Imodium 2mg  capsule. Take 2 capsules after the 1st loose stool and then 1 capsule every 2 hours until you go a total of 12 hours without having a loose stool. Call the [facility] if loose stools continue. If diarrhea occurs at bedtime,  take 2 capsules at bedtime. Then take 2 capsules every 4 hours until morning. Call [facility].    Diarrhea Sheet   If you are having loose stools/diarrhea, please purchase Imodium and begin taking as outlined:  At the first sign of poorly formed or loose stools you should begin taking Imodium (loperamide) 2 mg capsules.  Take two caplets (4mg ) followed by one caplet (2mg ) every 2 hours until you have had no diarrhea for 12 hours.  During the night take two caplets (4mg ) at bedtime and continue every 4 hours during the night until the morning.  Stop taking Imodium only after there is no sign of diarrhea for 12 hours.    Always call the [facility] if you are having loose stools/diarrhea that you can't get under control.  Loose stools/diarrhea leads to dehydration (loss of water) in your body.  We have other options of  trying to get the loose stools/diarrhea to stop but you must let us know!   Constipation Sheet  Colace - 100 mg capsules - take 2 capsules daily.  If this doesn't help then you can increase to 2 capsules twice daily.  Please call if the above does not work for you.   Do not go more than 2 days without a bowel movement.  It is very important that you do not become constipated.  It will make you feel sick to your stomach (nausea) and can cause abdominal pain and vomiting.   Nausea Sheet   Compazine/Prochlorperazine 10mg  tablet. Take 1 tablet every 6 hours as needed for nausea/vomiting. (This can make you sleepy)  If you are having persistent nausea (nausea that does not stop) please call the [facility] and let us know the amount of nausea that you are experiencing.  If you begin to vomit, you need to call the [facility] and if it is the weekend and you have vomited more than one time and can't get it to stop-go to the Emergency Room.  Persistent nausea/vomiting can lead to dehydration (loss of fluid in your body) and will make you feel terrible.   Ice chips, sips of  clear liquids, foods that are @ room temperature, crackers, and toast tend to be better tolerated.   SYMPTOMS TO REPORT AS SOON AS POSSIBLE AFTER TREATMENT:   FEVER GREATER THAN 100.5 F  CHILLS WITH OR WITHOUT FEVER  NAUSEA AND VOMITING THAT IS NOT CONTROLLED WITH YOUR NAUSEA MEDICATION  UNUSUAL SHORTNESS OF BREATH  UNUSUAL BRUISING OR BLEEDING  TENDERNESS IN MOUTH AND THROAT WITH OR WITHOUT PRESENCE OF ULCERS  URINARY PROBLEMS  BOWEL PROBLEMS  UNUSUAL RASH      Wear comfortable clothing and clothing appropriate for easy access to any Portacath or PICC line. Let us know if there is anything that we can do to make your therapy better!    What to do if you need assistance after hours or on the weekends: CALL [phone].  HOLD on the line, do not hang up.  You will hear multiple messages but at the end you will be connected with a nurse triage line.  They will contact the doctor if necessary.  Most of the time they will be able to assist you.  Do not call the hospital operator.      I have been informed and understand all of the instructions given to me and have received a copy. I have been instructed to call the clinic [phone] or my family physician as soon as possible for continued medical care, if indicated. I do not have any more questions at this time but understand that I may call the [facility] or the Patient Navigator at [phone] during office hours should I have questions or need assistance in obtaining follow-up care.

## 2018-02-24 NOTE — Progress Notes (Signed)
Chemotherapy/immunotherapy teaching packet pulled together.

## 2018-02-24 NOTE — Progress Notes (Signed)
Patient's wife, [name], called stating that the scopolamine patch has helped control Mr. Wardrip's nausea. She is requesting the script be sent in to their pharmacy.  Orders placed.

## 2018-02-25 ENCOUNTER — Telehealth (HOSPITAL_COMMUNITY): Payer: Self-pay

## 2018-02-25 NOTE — Telephone Encounter (Signed)
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  Noted patient has been started on radiation on 02/14/18 (whole brain) will finish on 12/10.  Noted patient to start chemotherapy of premetrexed and carboplatin on 12/11.  Past medical history of MI, HTN, smoker.  Called and spoke with wife via phone this pm.  Introduce self and service.  Wife reports patient has had issues with nausea not being under control which has effected intake.  Reports that patch is helping.  Reports that patient has been taking bites and sips at best (pudding, chicken noodle soups, gatorade, ice cream).  Reports some issues with constipation has colace and miralax.      Medications: colace, miralax, dexamethasone, folic acid, scopolamine patch  Labs: reviewed from 11/22  Anthropometrics:   Height: 71 inches Weight: 175 lb Noted weight of 210 lb on 01/22/18 BMI: 24  17% weight loss in the last month, significant   Estimated Energy Needs  Kcals: 2400-2800 calories Protein: 120-140 g/d Fluid: >2.4 L/d  NUTRITION DIAGNOSIS: Inadequate oral intake related to nausea, cancer and cancer related treatment side effects as evidenced by 17% weight loss in 1 month, eating < 50% energy needs for > or equal to 5 days.   MALNUTRITION DIAGNOSIS: likely unable to determine via phone   INTERVENTION:  Discussed strategies to help with nausea and vomiting.  Handout emailed per wife's request Discussed strategies to increase calories and protein.  Handout emailed Patient does not like high calorie shakes but likes ensure original.  Also discussed additional shakes to try.   Contact information given to wife.  Discussed option of nutrition appointment and declined at this time.  Wife to call with questions  MONITORING, EVALUATION, GOAL: Patient will consume  adequate calories and protein to prevent further weight loss   NEXT VISIT: wife to contact RD  Sabella Traore B. Zenia Resides, [place], [place] Registered Dietitian [phone] (pager)

## 2018-03-02 ENCOUNTER — Inpatient Hospital Stay (HOSPITAL_COMMUNITY): Payer: Self-pay

## 2018-03-02 ENCOUNTER — Encounter (HOSPITAL_COMMUNITY): Payer: Self-pay

## 2018-03-02 VITALS — BP 100/59 | HR 77 | Temp 97.5°F | Resp 18 | Wt 173.4 lb

## 2018-03-02 DIAGNOSIS — C3492 Malignant neoplasm of unspecified part of left bronchus or lung: Secondary | ICD-10-CM

## 2018-03-02 LAB — CBC WITH DIFFERENTIAL/PLATELET
Abs Immature Granulocytes: 0.1 10*3/uL — ABNORMAL HIGH (ref 0.00–0.07)
Basophils Absolute: 0 10*3/uL (ref 0.0–0.1)
Basophils Relative: 0 %
Eosinophils Absolute: 0.1 10*3/uL (ref 0.0–0.5)
Eosinophils Relative: 1 %
HCT: 38.2 % — ABNORMAL LOW (ref 39.0–52.0)
Hemoglobin: 12.3 g/dL — ABNORMAL LOW (ref 13.0–17.0)
Immature Granulocytes: 1 %
Lymphocytes Relative: 9 %
Lymphs Abs: 1.3 10*3/uL (ref 0.7–4.0)
MCH: 27.9 pg (ref 26.0–34.0)
MCHC: 32.2 g/dL (ref 30.0–36.0)
MCV: 86.6 fL (ref 80.0–100.0)
Monocytes Absolute: 0.6 10*3/uL (ref 0.1–1.0)
Monocytes Relative: 4 %
NEUTROS ABS: 12.8 10*3/uL — AB (ref 1.7–7.7)
Neutrophils Relative %: 85 %
Platelets: 269 10*3/uL (ref 150–400)
RBC: 4.41 MIL/uL (ref 4.22–5.81)
RDW: 14.7 % (ref 11.5–15.5)
WBC: 14.9 10*3/uL — ABNORMAL HIGH (ref 4.0–10.5)
nRBC: 0 % (ref 0.0–0.2)

## 2018-03-02 LAB — COMPREHENSIVE METABOLIC PANEL
ALT: 19 U/L (ref 0–44)
ANION GAP: 10 (ref 5–15)
AST: 16 U/L (ref 15–41)
Albumin: 3.1 g/dL — ABNORMAL LOW (ref 3.5–5.0)
Alkaline Phosphatase: 160 U/L — ABNORMAL HIGH (ref 38–126)
BUN: 16 mg/dL (ref 6–20)
CO2: 26 mmol/L (ref 22–32)
Calcium: 9 mg/dL (ref 8.9–10.3)
Chloride: 98 mmol/L (ref 98–111)
Creatinine, Ser: 1.09 mg/dL (ref 0.61–1.24)
GFR calc Af Amer: >60 mL/min (ref >60)
Glucose, Bld: 152 mg/dL — ABNORMAL HIGH (ref 70–99)
POTASSIUM: 3.5 mmol/L (ref 3.5–5.1)
Sodium: 134 mmol/L — ABNORMAL LOW (ref 135–145)
Total Bilirubin: 1.2 mg/dL (ref 0.3–1.2)
Total Protein: 8.6 g/dL — ABNORMAL HIGH (ref 6.5–8.1)

## 2018-03-02 LAB — TSH: TSH: 1.126 u[IU]/mL (ref 0.350–4.500)

## 2018-03-02 MED ORDER — SODIUM CHLORIDE 0.9 % IV SOLN
Freq: Once | INTRAVENOUS | Status: AC
  Administered 2018-03-02: 11:00:00 via INTRAVENOUS
  Filled 2018-03-02: qty 5

## 2018-03-02 MED ORDER — HEPARIN SOD (PORK) LOCK FLUSH 100 UNIT/ML IV SOLN
500.0000 [IU] | Freq: Once | INTRAVENOUS | Status: AC | PRN
  Administered 2018-03-02: 500 [IU]

## 2018-03-02 MED ORDER — SODIUM CHLORIDE 0.9 % IV SOLN
550.0000 mg | Freq: Once | INTRAVENOUS | Status: AC
  Administered 2018-03-02: 550 mg via INTRAVENOUS
  Filled 2018-03-02: qty 55

## 2018-03-02 MED ORDER — HEPARIN SOD (PORK) LOCK FLUSH 100 UNIT/ML IV SOLN
INTRAVENOUS | Status: AC
  Filled 2018-03-02: qty 5

## 2018-03-02 MED ORDER — SODIUM CHLORIDE 0.9 % IV SOLN
Freq: Once | INTRAVENOUS | Status: AC
  Administered 2018-03-02: 11:00:00 via INTRAVENOUS

## 2018-03-02 MED ORDER — SODIUM CHLORIDE 0.9% FLUSH
10.0000 mL | INTRAVENOUS | Status: DC | PRN
  Administered 2018-03-02: 10 mL
  Filled 2018-03-02: qty 10

## 2018-03-02 MED ORDER — SODIUM CHLORIDE 0.9 % IV SOLN
500.0000 mg/m2 | Freq: Once | INTRAVENOUS | Status: AC
  Administered 2018-03-02: 1000 mg via INTRAVENOUS
  Filled 2018-03-02: qty 40

## 2018-03-02 MED ORDER — PALONOSETRON HCL INJECTION 0.25 MG/5ML
0.2500 mg | Freq: Once | INTRAVENOUS | Status: AC
  Administered 2018-03-02: 0.25 mg via INTRAVENOUS
  Filled 2018-03-02: qty 5

## 2018-03-02 NOTE — Progress Notes (Signed)
Pt presents today for Day 1 Cycle 1 Alimta/Paraplatin today. Beryle Flock will be held today. VSS. Consent obtained today. Teaching performed. Pt booklet given. Pt verbalized an understanding of all instructions.   Treatment given today per MD orders. Tolerated infusion without adverse affects. Vital signs stable. No complaints at this time. Discharged from clinic ambulatory. F/U with [facility] as scheduled.

## 2018-03-02 NOTE — Patient Instructions (Signed)
[facility] Discharge Instructions for Patients Receiving Chemotherapy  Today you received the following chemotherapy agents   To help prevent nausea and vomiting after your treatment, we encourage you to take your nausea medication   If you develop nausea and vomiting that is not controlled by your nausea medication, call the clinic.   BELOW ARE SYMPTOMS THAT SHOULD BE REPORTED IMMEDIATELY:  *FEVER GREATER THAN 100.5 F  *CHILLS WITH OR WITHOUT FEVER  NAUSEA AND VOMITING THAT IS NOT CONTROLLED WITH YOUR NAUSEA MEDICATION  *UNUSUAL SHORTNESS OF BREATH  *UNUSUAL BRUISING OR BLEEDING  TENDERNESS IN MOUTH AND THROAT WITH OR WITHOUT PRESENCE OF ULCERS  *URINARY PROBLEMS  *BOWEL PROBLEMS  UNUSUAL RASH Items with * indicate a potential emergency and should be followed up as soon as possible.  Feel free to call the clinic should you have any questions or concerns. The clinic phone number is [phone].  Please show the CHEMO ALERT CARD at check-in to the Emergency Department and triage nurse.   

## 2018-03-03 ENCOUNTER — Telehealth (HOSPITAL_COMMUNITY): Payer: Self-pay | Admitting: *Deleted

## 2018-03-03 ENCOUNTER — Encounter (HOSPITAL_COMMUNITY): Payer: Self-pay | Admitting: Hematology

## 2018-03-03 ENCOUNTER — Encounter (HOSPITAL_COMMUNITY): Payer: Self-pay | Admitting: *Deleted

## 2018-03-03 LAB — T4: T4, Total: 8.9 ug/dL (ref 4.5–12.0)

## 2018-03-03 NOTE — Progress Notes (Signed)
Patient's daughter called concerned that her father has been hallucinating today. He has been seeing inanimate objects move and is alerting others to watch out. He is talking to the dogs that aren't there, telling them to hush up and move on.  He has been talking about paying for parts at the store that were ordered and family reports nothing has been ordered.  They deny giving him compazine for nausea, they have been using dramamine and scope patch. They have given him morphine for pain.  His blood sugar is 146 by finger stick at home.  I called and spoke with Dr. Frances Maywood at [facility] in [place] and they advised that it should not be anything related to his radiation.  I spoke with Dr. Delton Coombes and he states that sometimes it can happen with certain medications.  He wants patient to come in for 1 liter of fluids.  Patient is scheduled for fluids and family is aware of appointment time.

## 2018-03-03 NOTE — Telephone Encounter (Signed)
24 hour callback. Patients wife states he has been resting since chemo tx yesterday. She states he occasionally says things that don't make sense but she thinks it may be because he hasn't been sleeping much lately. She says she will watch him and let us know if anything new comes up.

## 2018-03-04 ENCOUNTER — Encounter (HOSPITAL_COMMUNITY)
Admission: RE | Admit: 2018-03-04 | Discharge: 2018-03-04 | Disposition: A | Discharge status: Home / Self Care | Payer: Self-pay | Source: Ambulatory Visit | Attending: Hematology | Admitting: Hematology

## 2018-03-04 MED ORDER — PROMETHAZINE HCL 25 MG/ML IJ SOLN: 6.2500 mg | INTRAMUSCULAR | Status: DC | PRN

## 2018-03-04 MED ORDER — MIDAZOLAM HCL 2 MG/2ML IJ SOLN: 0.5000 mg | Freq: Once | INTRAMUSCULAR | Status: DC | PRN

## 2018-03-04 MED ORDER — LACTATED RINGERS IV SOLN: INTRAVENOUS | Status: DC

## 2018-03-08 ENCOUNTER — Emergency Department (HOSPITAL_COMMUNITY)
Admission: EM | Admit: 2018-03-08 | Discharge: 2018-03-09 | Disposition: A | Discharge status: Home / Self Care | Payer: Self-pay | Attending: Emergency Medicine | Admitting: Emergency Medicine

## 2018-03-08 ENCOUNTER — Other Ambulatory Visit: Payer: Self-pay

## 2018-03-08 ENCOUNTER — Emergency Department (HOSPITAL_COMMUNITY): Payer: Self-pay

## 2018-03-08 ENCOUNTER — Encounter (HOSPITAL_COMMUNITY): Payer: Self-pay | Admitting: Emergency Medicine

## 2018-03-08 DIAGNOSIS — Z79899 Other long term (current) drug therapy: Secondary | ICD-10-CM | POA: Insufficient documentation

## 2018-03-08 DIAGNOSIS — Z7982 Long term (current) use of aspirin: Secondary | ICD-10-CM | POA: Insufficient documentation

## 2018-03-08 DIAGNOSIS — F17210 Nicotine dependence, cigarettes, uncomplicated: Secondary | ICD-10-CM | POA: Insufficient documentation

## 2018-03-08 DIAGNOSIS — I259 Chronic ischemic heart disease, unspecified: Secondary | ICD-10-CM | POA: Insufficient documentation

## 2018-03-08 DIAGNOSIS — C799 Secondary malignant neoplasm of unspecified site: Secondary | ICD-10-CM

## 2018-03-08 DIAGNOSIS — E860 Dehydration: Secondary | ICD-10-CM | POA: Insufficient documentation

## 2018-03-08 DIAGNOSIS — I10 Essential (primary) hypertension: Secondary | ICD-10-CM | POA: Insufficient documentation

## 2018-03-08 DIAGNOSIS — C3490 Malignant neoplasm of unspecified part of unspecified bronchus or lung: Secondary | ICD-10-CM | POA: Insufficient documentation

## 2018-03-08 MED ORDER — ONDANSETRON HCL 4 MG/2ML IJ SOLN
4.0000 mg | Freq: Once | INTRAMUSCULAR | Status: AC
  Administered 2018-03-09: 4 mg via INTRAVENOUS
  Filled 2018-03-08: qty 2

## 2018-03-08 MED ORDER — SODIUM CHLORIDE 0.9 % IV BOLUS
1000.0000 mL | Freq: Once | INTRAVENOUS | Status: AC
  Administered 2018-03-09: 1000 mL via INTRAVENOUS

## 2018-03-08 NOTE — ED Provider Notes (Signed)
[facility] EMERGENCY DEPARTMENT Provider Note   CSN: [id number] Arrival date & time: 03/08/18  2305     History   Chief Complaint Chief Complaint  Patient presents with  . Weakness    HPI Ian Roberts is a 57 y.o. male.  Patient with history of metastatic lung cancer with recent completion of 10 days of radiation presenting with generalized weakness.  States he finished his first chemotherapy 6 days ago.  He has had a very poor appetite with nausea and vomiting since despite using Zofran and scopolamine patches at home.  Reports one episode of vomiting today and no p.o. intake for several days.  States has been constipated with last bowel movement yesterday.  No fevers.  Today he developed left-sided rib pain that is worse with deep breathing and a cough productive of clear mucus.  Has intermittent dizziness and lightheadedness with position change and poor appetite.  Denies any abdominal pain, central chest pain or shortness of breath.  The history is provided by the patient and the spouse.  Weakness  Primary symptoms include dizziness. Associated symptoms include vomiting. Pertinent negatives include no shortness of breath and no chest pain.    Past Medical History:  Diagnosis Date  . Bone lesion    right shoulder found on x-ray  . CAD (coronary artery disease)   . Dyslipidemia   . Hypertension   . Hypokalemia   . Necrotizing fasciitis ([place])   . Rotator cuff tear   . ST elevation myocardial infarction (STEMI) of inferior wall (HCC)    Assocaited with paroxysmal atrial fibrillation 05/13/2008; Xience drug eluting stent  . Stroke [facility]     Patient Active Problem List   Diagnosis Date Noted  . Goals of care, counseling/discussion 02/22/2018  . Metastatic lung cancer (metastasis from lung to other site), left ([place]) 01/31/2018  . DYSLIPIDEMIA 06/05/2008  . HYPERTENSION, BENIGN ESSENTIAL 06/05/2008  . ACUT MI OTH INF WALL SUBSQT EPIS CARE 06/05/2008  . CORONARY  ATHEROSCLEROSIS NATIVE CORONARY ARTERY 06/05/2008    Past Surgical History:  Procedure Laterality Date  . CORONARY ANGIOPLASTY WITH STENT PLACEMENT    . CYST EXCISION    . IR IMAGING GUIDED PORT INSERTION  02/11/2018  . TONSILLECTOMY          Home Medications    Prior to Admission medications   Medication Sig Start Date End Date Taking? Authorizing Provider  amLODipine (NORVASC) 10 MG tablet Take 10 mg by mouth daily.     [provider]  aspirin EC 325 MG tablet Take 325 mg by mouth daily.     [provider]  CARBOPLATIN IV Inject into the vein.    [provider]  dexamethasone (DECADRON) 4 MG tablet Take 1 tablet (4 mg total) by mouth daily. 02/22/18   Lockamy, Randi L, NP-C  docusate sodium (COLACE) 100 MG capsule Take 100 mg by mouth daily.     [provider]  folic acid (FOLVITE) 1 MG tablet Take 1 tablet (1 mg total) by mouth daily. 02/22/18   Lockamy, Randi L, NP-C  hydrochlorothiazide (HYDRODIURIL) 25 MG tablet Take 25 mg by mouth daily.     [provider]  ibuprofen (ADVIL,MOTRIN) 200 MG tablet Take 600 mg by mouth every 6 (six) hours as needed for headache or moderate pain.  01/21/18   [provider]  lidocaine-prilocaine (EMLA) cream Apply small amount over port site and cover with plastic wrap one hour prior to appointment. 02/24/18   Delton Coombes,  Dirk Dress, MD  lisinopril (PRINIVIL,ZESTRIL) 20 MG tablet Take 20 mg by mouth daily.    [provider]  memantine (NAMENDA) 5 MG tablet TAKE 1 TABLET IN THE MORNING FOR 7 DAYS, 1 TABLET IN THE MORNING & 1 TABLET IN THE EVENING FOR 7 DAYS, 2 TABS IN THE MORNING & 1 TABLET IN T 02/05/18   [provider]  morphine (MSIR) 30 MG tablet Take 1 tablet (30 mg total) by mouth every 6 (six) hours as needed for severe pain. 01/31/18   Derek Jack, MD  ondansetron (ZOFRAN ODT) 4 MG disintegrating tablet Take 1 tablet (4 mg total) by mouth every 8 (eight) hours as  needed for nausea or vomiting. 02/21/18   Lockamy, Randi L, NP-C  PEMBROLIZUMAB IV Inject into the vein.    [provider]  PEMEtrexed 500 mg/m2 in sodium chloride 0.9 % 100 mL Inject 500 mg/m2 into the vein once.    [provider]  prochlorperazine (COMPAZINE) 10 MG tablet Take 1 tablet (10 mg total) by mouth every 6 (six) hours as needed for nausea or vomiting. 01/31/18   Derek Jack, MD  scopolamine (TRANSDERM-SCOP) 1 MG/3DAYS Place 1 patch (1.5 mg total) onto the skin every 3 (three) days. 02/24/18   Derek Jack, MD  simvastatin (ZOCOR) 20 MG tablet Take 20 mg by mouth daily.     [provider]    Family History Family History  Problem Relation Age of Onset  . Diabetes Mother   . Hypertension Mother   . Lung cancer Father     Social History Social History   Tobacco Use  . Smoking status: Current Every Day Smoker    Packs/day: 1.50    Years: 41.00    Pack years: 61.50    Types: Cigarettes  . Smokeless tobacco: Never Used  Substance Use Topics  . Alcohol use: Never    Frequency: Never  . Drug use: Never     Allergies   Hydrocodone   Review of Systems Review of Systems  Constitutional: Positive for activity change, appetite change and fatigue. Negative for fever.  HENT: Negative for congestion and rhinorrhea.   Eyes: Negative for visual disturbance.  Respiratory: Positive for cough. Negative for chest tightness and shortness of breath.   Cardiovascular: Negative for chest pain.  Gastrointestinal: Positive for constipation, nausea and vomiting. Negative for abdominal pain.  Genitourinary: Negative for dysuria and hematuria.  Musculoskeletal: Positive for myalgias.  Neurological: Positive for dizziness, weakness and light-headedness.   all other systems are negative except as noted in the HPI and PMH.     Physical Exam Updated Vital Signs BP 100/84   Pulse 95   Temp 98.1 F (36.7 C)   Resp (!) 21   Wt 78.6 kg    SpO2 98%   BMI 24.17 kg/m   Physical Exam Vitals signs and nursing note reviewed.  Constitutional:      General: He is not in acute distress.    Appearance: He is well-developed.     Comments: Chronically ill-appearing  HENT:     Head: Normocephalic and atraumatic.     Mouth/Throat:     Mouth: Mucous membranes are dry.     Pharynx: No oropharyngeal exudate.  Eyes:     Conjunctiva/sclera: Conjunctivae normal.     Pupils: Pupils are equal, round, and reactive to light.  Neck:     Musculoskeletal: Normal range of motion and neck supple.     Comments: No meningismus. Cardiovascular:  Rate and Rhythm: Normal rate and regular rhythm.     Heart sounds: Normal heart sounds. No murmur.  Pulmonary:     Effort: Pulmonary effort is normal. No respiratory distress.     Breath sounds: Normal breath sounds.  Abdominal:     Palpations: Abdomen is soft.     Tenderness: There is abdominal tenderness. There is no guarding or rebound.  Musculoskeletal: Normal range of motion.        General: No tenderness.     Right lower leg: No edema.     Left lower leg: No edema.  Skin:    General: Skin is warm.     Capillary Refill: Capillary refill takes less than 2 seconds.  Neurological:     Mental Status: He is alert and oriented to person, place, and time.     Cranial Nerves: No cranial nerve deficit.     Motor: No abnormal muscle tone.     Coordination: Coordination normal.     Comments: No ataxia on finger to nose bilaterally. No pronator drift. 5/5 strength throughout. CN 2-12 intact.Equal grip strength. Sensation intact.   Psychiatric:        Behavior: Behavior normal.      ED Treatments / Results  Labs (all labs ordered are listed, but only abnormal results are displayed) Labs Reviewed  CBC WITH DIFFERENTIAL/PLATELET - Abnormal; Notable for the following components:      Result Value   WBC 2.8 (*)    RBC 4.13 (*)    Hemoglobin 11.4 (*)    HCT 35.0 (*)    Platelets 129 (*)     Lymphs Abs 0.3 (*)    Monocytes Absolute 0.0 (*)    All other components within normal limits  COMPREHENSIVE METABOLIC PANEL - Abnormal; Notable for the following components:   Sodium 133 (*)    CO2 21 (*)    Glucose, Bld 151 (*)    BUN 31 (*)    Calcium 8.4 (*)    Albumin 2.8 (*)    Alkaline Phosphatase 227 (*)    Total Bilirubin 1.9 (*)    All other components within normal limits  URINALYSIS, ROUTINE W REFLEX MICROSCOPIC - Abnormal; Notable for the following components:   Specific Gravity, Urine >1.046 (*)    All other components within normal limits  LIPASE, BLOOD  TROPONIN I    EKG EKG Interpretation  Date/Time:  Tuesday March 08 2018 23:25:19 EST Ventricular Rate:  85 PR Interval:    QRS Duration: 101 QT Interval:  358 QTC Calculation: 426 R Axis:   26 Text Interpretation:  Sinus rhythm Low voltage, extremity leads Posterior infarct, old No significant change was found Confirmed by Ezequiel Essex [phone]) on 03/08/2018 11:45:45 PM   Radiology Dg Chest 2 View  Result Date: 03/09/2018 CLINICAL DATA:  Weakness, lung cancer. Left-sided chest pain, onset today. EXAM: CHEST - 2 VIEW COMPARISON:  Chest CT 01/28/2018 FINDINGS: Left chest port tip in the mid SVC. Central left lung mass on CT is not as well demonstrated radiographically, there is ill-defined density in this region. Right lung is clear. No pneumothorax, pleural effusion or pulmonary edema. Destructive lesion involving the right scapula. IMPRESSION: 1. No acute findings demonstrated radiographically. 2. Opacity in the left infrahilar region corresponds to known left lung mass, not well assessed on radiograph. 3. Known destructive lesion involving the right scapula. Electronically Signed   By: Keith Rake M.D.   On: 03/09/2018 00:10   Ct Angio Chest Pe  W And/or Wo Contrast  Result Date: 03/09/2018 CLINICAL DATA:  Shortness of breath and weakness. EXAM: CT ANGIOGRAPHY CHEST CT ABDOMEN AND PELVIS WITH  CONTRAST TECHNIQUE: Multidetector CT imaging of the chest was performed using the standard protocol during bolus administration of intravenous contrast. Multiplanar CT image reconstructions and MIPs were obtained to evaluate the vascular anatomy. Multidetector CT imaging of the abdomen and pelvis was performed using the standard protocol during bolus administration of intravenous contrast. CONTRAST:  149mL ISOVUE-370 IOPAMIDOL (ISOVUE-370) INJECTION 76% COMPARISON:  CT abdomen pelvis and CT chest 01/28/2018 FINDINGS: CTA CHEST FINDINGS Cardiovascular: Contrast injection is sufficient to demonstrate satisfactory opacification of the pulmonary arteries to the segmental level. There is no pulmonary embolus. The main pulmonary artery is within normal limits for size. There is no CT evidence of acute right heart strain. The visualized aorta is normal. There is a normal 3-vessel arch branching pattern. Heart size is normal, without pericardial effusion. Mediastinum/Nodes: There is confluent soft tissue surrounding the proximal left mainstem bronchus. This encases most of the hilar vessels. There are multiple subcentimeter mediastinal lymph nodes. Left paratracheal node measures 11 mm. Lungs/Pleura: Unchanged appearance of left lower lobe mass that measures approximately 5.3 x 3.5 cm. The right lung is clear. 2 cm posterior left lower lobe nodule is unchanged. The endobronchial extension of the larger left lower lobe tumor along the segmental bronchi is unchanged. No left upper lobe lesions. Musculoskeletal: There is a large lytic lesion of the right scapula, unchanged. Review of the MIP images confirms the above findings. CT ABDOMEN and PELVIS FINDINGS Hepatobiliary: There are multiple hypoattenuating liver lesions. The largest is near the gallbladder fossa and measures 2.1 cm. There is a 1.5 cm lesion in the superior right hepatic lobe. There are multiple smaller lesions also in the right hepatic lobe. Normal  gallbladder. Pancreas: Normal contours without ductal dilatation. No peripancreatic fluid collection. Calcification at the pancreatic body tail junction. Spleen: Normal. Adrenals/Urinary Tract: --Adrenal glands: Right adrenal mass is new compared to the prior study and measures 1.7 x 1.2 cm. Left adrenal gland is normal. --Right kidney/ureter: No hydronephrosis or perinephric stranding. No nephrolithiasis. No obstructing ureteral stones. --Left kidney/ureter: No hydronephrosis or perinephric stranding. No nephrolithiasis. No obstructing ureteral stones. --Urinary bladder: Unremarkable. Stomach/Bowel: --Stomach/Duodenum: No hiatal hernia or other gastric abnormality. Normal duodenal course and caliber. --Small bowel: No dilatation or inflammation. --Colon: No focal abnormality. --Appendix: Normal. Vascular/Lymphatic: Atherosclerotic calcification is present within the non-aneurysmal abdominal aorta, without hemodynamically significant stenosis. No abdominal or pelvic lymphadenopathy. Previously seen inguinal adenopathy has resolved. Reproductive: Normal prostate and seminal vesicles. Musculoskeletal. No bony spinal canal stenosis or focal osseous abnormality. Other: None. IMPRESSION: 1. No pulmonary embolus or acute aortic syndrome. 2. Unchanged appearance of left lower lobe lung mass, associated endobronchial spread of tumor, and left lower lobe satellite nodule. 3. Unchanged right scapular metastasis. 4. New right adrenal mass, consistent with metastatic disease. 5. Multiple hypoattenuating liver lesions, consistent with hepatic metastatic disease. These may have been present previously, but undetectable on the earlier non-contrast examination. Aortic Atherosclerosis (ICD10-I70.0). Electronically Signed   By: Ulyses Jarred M.D.   On: 03/09/2018 02:17   Ct Abdomen Pelvis W Contrast  Result Date: 03/09/2018 CLINICAL DATA:  Shortness of breath and weakness. EXAM: CT ANGIOGRAPHY CHEST CT ABDOMEN AND PELVIS WITH  CONTRAST TECHNIQUE: Multidetector CT imaging of the chest was performed using the standard protocol during bolus administration of intravenous contrast. Multiplanar CT image reconstructions and MIPs were obtained to evaluate the vascular  anatomy. Multidetector CT imaging of the abdomen and pelvis was performed using the standard protocol during bolus administration of intravenous contrast. CONTRAST:  137mL ISOVUE-370 IOPAMIDOL (ISOVUE-370) INJECTION 76% COMPARISON:  CT abdomen pelvis and CT chest 01/28/2018 FINDINGS: CTA CHEST FINDINGS Cardiovascular: Contrast injection is sufficient to demonstrate satisfactory opacification of the pulmonary arteries to the segmental level. There is no pulmonary embolus. The main pulmonary artery is within normal limits for size. There is no CT evidence of acute right heart strain. The visualized aorta is normal. There is a normal 3-vessel arch branching pattern. Heart size is normal, without pericardial effusion. Mediastinum/Nodes: There is confluent soft tissue surrounding the proximal left mainstem bronchus. This encases most of the hilar vessels. There are multiple subcentimeter mediastinal lymph nodes. Left paratracheal node measures 11 mm. Lungs/Pleura: Unchanged appearance of left lower lobe mass that measures approximately 5.3 x 3.5 cm. The right lung is clear. 2 cm posterior left lower lobe nodule is unchanged. The endobronchial extension of the larger left lower lobe tumor along the segmental bronchi is unchanged. No left upper lobe lesions. Musculoskeletal: There is a large lytic lesion of the right scapula, unchanged. Review of the MIP images confirms the above findings. CT ABDOMEN and PELVIS FINDINGS Hepatobiliary: There are multiple hypoattenuating liver lesions. The largest is near the gallbladder fossa and measures 2.1 cm. There is a 1.5 cm lesion in the superior right hepatic lobe. There are multiple smaller lesions also in the right hepatic lobe. Normal  gallbladder. Pancreas: Normal contours without ductal dilatation. No peripancreatic fluid collection. Calcification at the pancreatic body tail junction. Spleen: Normal. Adrenals/Urinary Tract: --Adrenal glands: Right adrenal mass is new compared to the prior study and measures 1.7 x 1.2 cm. Left adrenal gland is normal. --Right kidney/ureter: No hydronephrosis or perinephric stranding. No nephrolithiasis. No obstructing ureteral stones. --Left kidney/ureter: No hydronephrosis or perinephric stranding. No nephrolithiasis. No obstructing ureteral stones. --Urinary bladder: Unremarkable. Stomach/Bowel: --Stomach/Duodenum: No hiatal hernia or other gastric abnormality. Normal duodenal course and caliber. --Small bowel: No dilatation or inflammation. --Colon: No focal abnormality. --Appendix: Normal. Vascular/Lymphatic: Atherosclerotic calcification is present within the non-aneurysmal abdominal aorta, without hemodynamically significant stenosis. No abdominal or pelvic lymphadenopathy. Previously seen inguinal adenopathy has resolved. Reproductive: Normal prostate and seminal vesicles. Musculoskeletal. No bony spinal canal stenosis or focal osseous abnormality. Other: None. IMPRESSION: 1. No pulmonary embolus or acute aortic syndrome. 2. Unchanged appearance of left lower lobe lung mass, associated endobronchial spread of tumor, and left lower lobe satellite nodule. 3. Unchanged right scapular metastasis. 4. New right adrenal mass, consistent with metastatic disease. 5. Multiple hypoattenuating liver lesions, consistent with hepatic metastatic disease. These may have been present previously, but undetectable on the earlier non-contrast examination. Aortic Atherosclerosis (ICD10-I70.0). Electronically Signed   By: Ulyses Jarred M.D.   On: 03/09/2018 02:17    Procedures Procedures (including critical care time)  Medications Ordered in ED Medications  sodium chloride 0.9 % bolus 1,000 mL (has no administration in  time range)  ondansetron (ZOFRAN) injection 4 mg (has no administration in time range)     Initial Impression / Assessment and Plan / ED Course  I have reviewed the triage vital signs and the nursing notes.  Pertinent labs & imaging results that were available during my care of the patient were reviewed by me and considered in my medical decision making (see chart for details).    Cancer patient with anorexia, nausea, vomiting, generalized weakness. Vitals are stable, afebrile.  Patient given IV fluids and antiemetics.  Chest x-ray shows no pneumonia and stable lung mass.  Labs are reassuring with stable creatinine.  Leukopenia likely due to recent chemotherapy.  Urinalysis is normal. No ketones in urine.  With pleuritic chest pain, CT scan obtained which shows no pulmonary embolism.  Does show stable metastases, possibly worse in the liver and adrenals which family is updated on.  Patient is tolerating p.o.   Discussed admission to the hospital for IV fluids versus discharge.  He would prefer to go home.  His pain and nausea are controlled.  Discussed with patient that his labs look stable and going home seems to be a reasonable option if that is what he would prefer.  He has pain and nausea medication at home to take.  Discussed oral hydration and following up with his PCP and oncologist.  Return precautions discussed.  Final Clinical Impressions(s) / ED Diagnoses   Final diagnoses:  Dehydration  Metastatic cancer [facility])    ED Discharge Orders    None       Kinzly Pierrelouis, Annie Main, MD 03/09/18 [phone]

## 2018-03-08 NOTE — ED Triage Notes (Signed)
Pt C/O weakness that began last Thursday. Pt received his first chemo treatment on Wednesday of last week. Pt C/O pain on his left side when he takes deep breath.

## 2018-03-09 ENCOUNTER — Emergency Department (HOSPITAL_COMMUNITY): Payer: Self-pay

## 2018-03-09 LAB — CBC WITH DIFFERENTIAL/PLATELET
Abs Immature Granulocytes: 0.07 10*3/uL (ref 0.00–0.07)
Basophils Absolute: 0 10*3/uL (ref 0.0–0.1)
Basophils Relative: 0 %
Eosinophils Absolute: 0.1 10*3/uL (ref 0.0–0.5)
Eosinophils Relative: 2 %
HCT: 35 % — ABNORMAL LOW (ref 39.0–52.0)
Hemoglobin: 11.4 g/dL — ABNORMAL LOW (ref 13.0–17.0)
Immature Granulocytes: 3 %
LYMPHS PCT: 11 %
Lymphs Abs: 0.3 10*3/uL — ABNORMAL LOW (ref 0.7–4.0)
MCH: 27.6 pg (ref 26.0–34.0)
MCHC: 32.6 g/dL (ref 30.0–36.0)
MCV: 84.7 fL (ref 80.0–100.0)
Monocytes Absolute: 0 10*3/uL — ABNORMAL LOW (ref 0.1–1.0)
Monocytes Relative: 0 %
Neutro Abs: 2.4 10*3/uL (ref 1.7–7.7)
Neutrophils Relative %: 84 %
PLATELETS: 129 10*3/uL — AB (ref 150–400)
RBC: 4.13 MIL/uL — AB (ref 4.22–5.81)
RDW: 14.8 % (ref 11.5–15.5)
WBC: 2.8 10*3/uL — ABNORMAL LOW (ref 4.0–10.5)
nRBC: 0 % (ref 0.0–0.2)

## 2018-03-09 LAB — COMPREHENSIVE METABOLIC PANEL
ALT: 20 U/L (ref 0–44)
AST: 19 U/L (ref 15–41)
Albumin: 2.8 g/dL — ABNORMAL LOW (ref 3.5–5.0)
Alkaline Phosphatase: 227 U/L — ABNORMAL HIGH (ref 38–126)
Anion gap: 11 (ref 5–15)
BUN: 31 mg/dL — ABNORMAL HIGH (ref 6–20)
CO2: 21 mmol/L — AB (ref 22–32)
Calcium: 8.4 mg/dL — ABNORMAL LOW (ref 8.9–10.3)
Chloride: 101 mmol/L (ref 98–111)
Creatinine, Ser: 0.93 mg/dL (ref 0.61–1.24)
Glucose, Bld: 151 mg/dL — ABNORMAL HIGH (ref 70–99)
Potassium: 3.9 mmol/L (ref 3.5–5.1)
SODIUM: 133 mmol/L — AB (ref 135–145)
Total Bilirubin: 1.9 mg/dL — ABNORMAL HIGH (ref 0.3–1.2)
Total Protein: 7.8 g/dL (ref 6.5–8.1)

## 2018-03-09 LAB — TROPONIN I: Troponin I: <0.03 ng/mL (ref <0.03)

## 2018-03-09 LAB — URINALYSIS, ROUTINE W REFLEX MICROSCOPIC
Bilirubin Urine: NEGATIVE
GLUCOSE, UA: NEGATIVE mg/dL
Hgb urine dipstick: NEGATIVE
Ketones, ur: NEGATIVE mg/dL
Leukocytes, UA: NEGATIVE
Nitrite: NEGATIVE
Protein, ur: NEGATIVE mg/dL
Specific Gravity, Urine: >1.046 — ABNORMAL HIGH (ref 1.005–1.030)
pH: 7 (ref 5.0–8.0)

## 2018-03-09 LAB — LIPASE, BLOOD: Lipase: 25 U/L (ref 11–51)

## 2018-03-09 MED ORDER — SODIUM CHLORIDE 0.9 % IV BOLUS
1000.0000 mL | Freq: Once | INTRAVENOUS | Status: AC
  Administered 2018-03-09: 1000 mL via INTRAVENOUS

## 2018-03-09 MED ORDER — MORPHINE SULFATE (PF) 4 MG/ML IV SOLN
4.0000 mg | Freq: Once | INTRAVENOUS | Status: AC
  Administered 2018-03-09: 4 mg via INTRAVENOUS
  Filled 2018-03-09: qty 1

## 2018-03-09 MED ORDER — IOPAMIDOL (ISOVUE-370) INJECTION 76%
100.0000 mL | Freq: Once | INTRAVENOUS | Status: AC | PRN
  Administered 2018-03-09: 100 mL via INTRAVENOUS

## 2018-03-09 NOTE — Discharge Instructions (Addendum)
Keep yourself hydrated.  Take your pain and nausea medication as prescribed and follow-up with your doctor.  Return to the ED with new or worsening symptoms.

## 2018-03-16 ENCOUNTER — Emergency Department (HOSPITAL_COMMUNITY): Payer: Self-pay

## 2018-03-16 ENCOUNTER — Encounter (HOSPITAL_COMMUNITY): Payer: Self-pay | Admitting: Emergency Medicine

## 2018-03-16 ENCOUNTER — Other Ambulatory Visit: Payer: Self-pay

## 2018-03-16 ENCOUNTER — Emergency Department (HOSPITAL_COMMUNITY)
Admission: EM | Admit: 2018-03-16 | Discharge: 2018-03-16 | Disposition: A | Discharge status: Home / Self Care | Payer: Self-pay | Attending: Emergency Medicine | Admitting: Emergency Medicine

## 2018-03-16 DIAGNOSIS — F17210 Nicotine dependence, cigarettes, uncomplicated: Secondary | ICD-10-CM | POA: Insufficient documentation

## 2018-03-16 DIAGNOSIS — Z7982 Long term (current) use of aspirin: Secondary | ICD-10-CM | POA: Insufficient documentation

## 2018-03-16 DIAGNOSIS — E860 Dehydration: Secondary | ICD-10-CM | POA: Insufficient documentation

## 2018-03-16 DIAGNOSIS — Z79899 Other long term (current) drug therapy: Secondary | ICD-10-CM | POA: Insufficient documentation

## 2018-03-16 LAB — COMPREHENSIVE METABOLIC PANEL
ALT: 37 U/L (ref 0–44)
AST: 28 U/L (ref 15–41)
Albumin: 2.8 g/dL — ABNORMAL LOW (ref 3.5–5.0)
Alkaline Phosphatase: 228 U/L — ABNORMAL HIGH (ref 38–126)
Anion gap: 12 (ref 5–15)
BUN: 17 mg/dL (ref 6–20)
CO2: 20 mmol/L — ABNORMAL LOW (ref 22–32)
CREATININE: 0.76 mg/dL (ref 0.61–1.24)
Calcium: 8.4 mg/dL — ABNORMAL LOW (ref 8.9–10.3)
Chloride: 99 mmol/L (ref 98–111)
GFR calc Af Amer: >60 mL/min (ref >60)
GFR calc non Af Amer: >60 mL/min (ref >60)
Glucose, Bld: 121 mg/dL — ABNORMAL HIGH (ref 70–99)
Potassium: 3.1 mmol/L — ABNORMAL LOW (ref 3.5–5.1)
Sodium: 131 mmol/L — ABNORMAL LOW (ref 135–145)
Total Bilirubin: 2.7 mg/dL — ABNORMAL HIGH (ref 0.3–1.2)
Total Protein: 8.5 g/dL — ABNORMAL HIGH (ref 6.5–8.1)

## 2018-03-16 LAB — URINALYSIS, ROUTINE W REFLEX MICROSCOPIC
Glucose, UA: NEGATIVE mg/dL
Hgb urine dipstick: NEGATIVE
Ketones, ur: 20 mg/dL — AB
Leukocytes, UA: NEGATIVE
Nitrite: NEGATIVE
Protein, ur: 100 mg/dL — AB
Specific Gravity, Urine: 1.021 (ref 1.005–1.030)
pH: 6 (ref 5.0–8.0)

## 2018-03-16 LAB — CBC WITH DIFFERENTIAL/PLATELET
Abs Immature Granulocytes: 0.03 10*3/uL (ref 0.00–0.07)
Basophils Absolute: 0 10*3/uL (ref 0.0–0.1)
Basophils Relative: 0 %
Eosinophils Absolute: 0 10*3/uL (ref 0.0–0.5)
Eosinophils Relative: 0 %
HCT: 31 % — ABNORMAL LOW (ref 39.0–52.0)
Hemoglobin: 10.5 g/dL — ABNORMAL LOW (ref 13.0–17.0)
Immature Granulocytes: 1 %
LYMPHS ABS: 0.8 10*3/uL (ref 0.7–4.0)
Lymphocytes Relative: 30 %
MCH: 27.2 pg (ref 26.0–34.0)
MCHC: 33.9 g/dL (ref 30.0–36.0)
MCV: 80.3 fL (ref 80.0–100.0)
Monocytes Absolute: 0.3 10*3/uL (ref 0.1–1.0)
Monocytes Relative: 12 %
Neutro Abs: 1.5 10*3/uL — ABNORMAL LOW (ref 1.7–7.7)
Neutrophils Relative %: 57 %
Platelets: 97 10*3/uL — ABNORMAL LOW (ref 150–400)
RBC: 3.86 MIL/uL — ABNORMAL LOW (ref 4.22–5.81)
RDW: 14.5 % (ref 11.5–15.5)
WBC: 2.6 10*3/uL — ABNORMAL LOW (ref 4.0–10.5)
nRBC: 0 % (ref 0.0–0.2)

## 2018-03-16 LAB — I-STAT CG4 LACTIC ACID, ED
Lactic Acid, Venous: 2.43 mmol/L (ref 0.5–1.9)
Lactic Acid, Venous: 1.65 mmol/L (ref 0.5–1.9)

## 2018-03-16 MED ORDER — SODIUM CHLORIDE 0.9 % IV BOLUS
1000.0000 mL | Freq: Once | INTRAVENOUS | Status: AC
  Administered 2018-03-16: 1000 mL via INTRAVENOUS

## 2018-03-16 MED ORDER — POTASSIUM CHLORIDE CRYS ER 20 MEQ PO TBCR
40.0000 meq | EXTENDED_RELEASE_TABLET | Freq: Once | ORAL | Status: AC
  Administered 2018-03-16: 40 meq via ORAL
  Filled 2018-03-16: qty 2

## 2018-03-16 MED ORDER — ONDANSETRON HCL 4 MG PO TABS: 4.0000 mg | ORAL_TABLET | Freq: Three times a day (TID) | ORAL | 0 refills | Status: DC | PRN

## 2018-03-16 MED ORDER — ONDANSETRON HCL 4 MG/2ML IJ SOLN
4.0000 mg | Freq: Once | INTRAMUSCULAR | Status: AC
  Administered 2018-03-16: 4 mg via INTRAVENOUS
  Filled 2018-03-16: qty 2

## 2018-03-16 NOTE — ED Triage Notes (Signed)
Patient complaining of weakness and pain on his left side. Patient is currently taking chemo treatments. Patient states some nausea and vomiting.

## 2018-03-16 NOTE — ED Notes (Signed)
Patient complaining of abdominal pain and left sided rib pain. Dr. Tomi Bamberger consulted and advised patient could take his prescribed home pain medication. Patient had his prescribed home medication of 30 mg's of oral morphine tablets. Patient took half a tab. Patient took a total of 15 mg's of oral morphine.

## 2018-03-16 NOTE — ED Notes (Signed)
Date and time results received: 03/16/18 03:00 (use smartphrase ".now" to insert current time)  Test: I stat Lactic Acid Critical Value: 2.43  Name of Provider Notified: Dr. Rolland Porter  Orders Received? Or Actions Taken?: Dr. Tomi Bamberger notified

## 2018-03-16 NOTE — ED Provider Notes (Signed)
[facility] EMERGENCY DEPARTMENT Provider Note   CSN: [id number] Arrival date & time: 03/16/18  0120  Time seen 1:48 AM   History   Chief Complaint Chief Complaint  Patient presents with  . Weakness    HPI Ian Roberts is a 57 y.o. male.  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  He had radiation therapy starting on November 25 through November 10 of his whole brain and the right scapula.  He had a lot of nausea from the radiation and was taking Compazine and Zofran which was not  Helping, and he was placed on a scopolamine patch which has helped.  He had his first chemotherapy on December 11 with carboplatin, pembrolizumab  and pemetrexed.  He was started on folic acid and got a N82 injection.  Wife states he was seen in the ED on December 17 for dehydration.  She states he has not been eating or drinking since he got his chemotherapy.  He has had chronic nausea and tonight had one episode of vomiting.  He took his last Zofran tonight.  She states he has been complaining of a bad aftertaste if he eats or drinks since he got the chemotherapy.  She has looked online and gave him a concoction to rinse and gargle with baking soda which initially helped but now it is not helping.  She states today he just is generally overall weak and has been sleeping a lot.  He states he has his usual chronic cough and normal mucus production.  He is complaining of left anterior lower chest pain that is pleuritic.  He had the same thing when he was in the ED before that was evaluated with a chest x-ray and CTA which showed his lung tumor is in his left lung.  He did not have a PE.  Patient denies any other pain to me.  He states his mouth feels dry and he is thirsty however he does not want to drink due to the aftertaste.  He states he feels dizzy and lightheaded at times.  They deny any  fevers.  He has had some intermittent shortness of breath but not tonight.  He denies diarrhea.  Tonight he was extremely wobbly and needed assistance walking.  Wife states he had one episode of urinary output today.  She states his baseline weight was 210 before he started treatments.  PCP Neale Burly, MD  Oncology Dr. Delton Coombes  Past Medical History:  Diagnosis Date  . Bone lesion    right shoulder found on x-ray  . CAD (coronary artery disease)   . Dyslipidemia   . Hypertension   . Hypokalemia   . Necrotizing fasciitis ([place])   . Rotator cuff tear   . ST elevation myocardial infarction (STEMI) of inferior wall (HCC)    Assocaited with paroxysmal atrial fibrillation 05/13/2008; Xience drug eluting stent  . Stroke [facility])     Patient Active Problem List   Diagnosis Date Noted  . Goals of care, counseling/discussion 02/22/2018  . Metastatic lung cancer (metastasis from lung to other site), left ([place]) 01/31/2018  . DYSLIPIDEMIA 06/05/2008  . HYPERTENSION, BENIGN ESSENTIAL 06/05/2008  . ACUT MI OTH INF WALL SUBSQT EPIS CARE 06/05/2008  . CORONARY ATHEROSCLEROSIS NATIVE CORONARY ARTERY 06/05/2008    Past Surgical History:  Procedure Laterality Date  . CORONARY ANGIOPLASTY WITH STENT PLACEMENT    .  CYST EXCISION    . IR IMAGING GUIDED PORT INSERTION  02/11/2018  . TONSILLECTOMY          Home Medications    Prior to Admission medications   Medication Sig Start Date End Date Taking? Authorizing Provider  amLODipine (NORVASC) 10 MG tablet Take 10 mg by mouth daily.     [provider]  aspirin EC 325 MG tablet Take 325 mg by mouth daily.     [provider]  CARBOPLATIN IV Inject into the vein.    [provider]  dexamethasone (DECADRON) 4 MG tablet Take 1 tablet (4 mg total) by mouth daily. 02/22/18   Lockamy, Randi L, NP-C  docusate sodium (COLACE) 100 MG capsule Take 100 mg by mouth daily.     [provider]  folic acid (FOLVITE) 1  MG tablet Take 1 tablet (1 mg total) by mouth daily. 02/22/18   Lockamy, Randi L, NP-C  hydrochlorothiazide (HYDRODIURIL) 25 MG tablet Take 25 mg by mouth daily.     [provider]  ibuprofen (ADVIL,MOTRIN) 200 MG tablet Take 600 mg by mouth every 6 (six) hours as needed for headache or moderate pain.  01/21/18   [provider]  lidocaine-prilocaine (EMLA) cream Apply small amount over port site and cover with plastic wrap one hour prior to appointment. 02/24/18   Derek Jack, MD  lisinopril (PRINIVIL,ZESTRIL) 20 MG tablet Take 20 mg by mouth daily.    [provider]  memantine (NAMENDA) 5 MG tablet TAKE 1 TABLET IN THE MORNING FOR 7 DAYS, 1 TABLET IN THE MORNING & 1 TABLET IN THE EVENING FOR 7 DAYS, 2 TABS IN THE MORNING & 1 TABLET IN T 02/05/18   [provider]  morphine (MSIR) 30 MG tablet Take 1 tablet (30 mg total) by mouth every 6 (six) hours as needed for severe pain. 01/31/18   Derek Jack, MD  ondansetron (ZOFRAN ODT) 4 MG disintegrating tablet Take 1 tablet (4 mg total) by mouth every 8 (eight) hours as needed for nausea or vomiting. 02/21/18   Lockamy, Randi L, NP-C  ondansetron (ZOFRAN) 4 MG tablet Take 1 tablet (4 mg total) by mouth every 8 (eight) hours as needed for nausea or vomiting. 03/16/18   Rolland Porter, MD  PEMBROLIZUMAB IV Inject into the vein.    [provider]  PEMEtrexed 500 mg/m2 in sodium chloride 0.9 % 100 mL Inject 500 mg/m2 into the vein once.    [provider]  prochlorperazine (COMPAZINE) 10 MG tablet Take 1 tablet (10 mg total) by mouth every 6 (six) hours as needed for nausea or vomiting. 01/31/18   Derek Jack, MD  scopolamine (TRANSDERM-SCOP) 1 MG/3DAYS Place 1 patch (1.5 mg total) onto the skin every 3 (three) days. 02/24/18   Derek Jack, MD  simvastatin (ZOCOR) 20 MG tablet Take 20 mg by mouth daily.     [provider]    Family History Family History  Problem  Relation Age of Onset  . Diabetes Mother   . Hypertension Mother   . Lung cancer Father     Social History Social History   Tobacco Use  . Smoking status: Current Every Day Smoker    Packs/day: 1.50    Years: 41.00    Pack years: 61.50    Types: Cigarettes  . Smokeless tobacco: Never Used  Substance Use Topics  . Alcohol use: Never    Frequency: Never  . Drug use: Never  lives at home  Lives with spouse   Allergies   Hydrocodone   Review of Systems Review of Systems  All other systems reviewed and are negative.    Physical Exam Updated Vital Signs BP 117/79 (BP Location: Left Arm)   Pulse (!) 116   Temp 98 F (36.7 C) (Oral)   Resp (!) 25   Ht 5\' 11"  (1.803 m)   Wt 78.6 kg   SpO2 100%   BMI 24.17 kg/m   Vital signs normal except for tachycardia   Physical Exam Vitals signs and nursing note reviewed.  Constitutional:      General: He is not in acute distress.    Appearance: Normal appearance. He is well-developed. He is ill-appearing. He is not toxic-appearing.  HENT:     Head: Normocephalic and atraumatic.     Right Ear: External ear normal.     Left Ear: External ear normal.     Nose: Nose normal. No mucosal edema or rhinorrhea.     Mouth/Throat:     Dentition: No dental abscesses.     Pharynx: No uvula swelling.  Eyes:     Pupils: Pupils are equal, round, and reactive to light.     Comments: Pale conjunctiva  Neck:     Musculoskeletal: Full passive range of motion without pain, normal range of motion and neck supple.  Cardiovascular:     Rate and Rhythm: Regular rhythm. Tachycardia present.     Heart sounds: Normal heart sounds. No murmur. No friction rub. No gallop.   Pulmonary:     Effort: Pulmonary effort is normal. No respiratory distress.     Breath sounds: Normal breath sounds. No wheezing, rhonchi or rales.  Chest:     Chest wall: No tenderness or crepitus.  Abdominal:     General: Bowel sounds are normal. There is no distension.      Palpations: Abdomen is soft.     Tenderness: There is no abdominal tenderness. There is no guarding or rebound.  Musculoskeletal: Normal range of motion.        General: No tenderness.     Comments: Moves all extremities well.   Skin:    General: Skin is warm and dry.     Coloration: Skin is pale.     Findings: No erythema or rash.  Neurological:     Mental Status: He is alert and oriented to person, place, and time.     Cranial Nerves: No cranial nerve deficit.  Psychiatric:        Mood and Affect: Mood is not anxious.        Speech: Speech normal.        Behavior: Behavior normal.      ED Treatments / Results  Labs (all labs ordered are listed, but only abnormal results are displayed) Results for orders placed or performed during the hospital encounter of 03/16/18  Comprehensive metabolic panel  Result Value Ref Range   Sodium 131 (L) 135 - 145 mmol/L   Potassium 3.1 (L) 3.5 - 5.1 mmol/L   Chloride 99 98 - 111 mmol/L   CO2 20 (L) 22 - 32 mmol/L   Glucose, Bld 121 (H) 70 - 99 mg/dL   BUN 17 6 - 20 mg/dL   Creatinine, Ser 0.76 0.61 - 1.24 mg/dL   Calcium 8.4 (L) 8.9 - 10.3 mg/dL   Total Protein 8.5 (H) 6.5 - 8.1 g/dL   Albumin 2.8 (L) 3.5 - 5.0 g/dL   AST 28 15 - 41 U/L  ALT 37 0 - 44 U/L   Alkaline Phosphatase 228 (H) 38 - 126 U/L   Total Bilirubin 2.7 (H) 0.3 - 1.2 mg/dL   GFR calc non Af Amer >60 >60 mL/min   GFR calc Af Amer >60 >60 mL/min   Anion gap 12 5 - 15  CBC with Differential  Result Value Ref Range   WBC 2.6 (L) 4.0 - 10.5 K/uL   RBC 3.86 (L) 4.22 - 5.81 MIL/uL   Hemoglobin 10.5 (L) 13.0 - 17.0 g/dL   HCT 31.0 (L) 39.0 - 52.0 %   MCV 80.3 80.0 - 100.0 fL   MCH 27.2 26.0 - 34.0 pg   MCHC 33.9 30.0 - 36.0 g/dL   RDW 14.5 11.5 - 15.5 %   Platelets 97 (L) 150 - 400 K/uL   nRBC 0.0 0.0 - 0.2 %   Neutrophils Relative % 57 %   Neutro Abs 1.5 (L) 1.7 - 7.7 K/uL   Lymphocytes Relative 30 %   Lymphs Abs 0.8 0.7 - 4.0 K/uL   Monocytes Relative 12 %    Monocytes Absolute 0.3 0.1 - 1.0 K/uL   Eosinophils Relative 0 %   Eosinophils Absolute 0.0 0.0 - 0.5 K/uL   Basophils Relative 0 %   Basophils Absolute 0.0 0.0 - 0.1 K/uL   Immature Granulocytes 1 %   Abs Immature Granulocytes 0.03 0.00 - 0.07 K/uL  Urinalysis, Routine w reflex microscopic  Result Value Ref Range   Color, Urine AMBER (A) YELLOW   APPearance CLEAR CLEAR   Specific Gravity, Urine 1.021 1.005 - 1.030   pH 6.0 5.0 - 8.0   Glucose, UA NEGATIVE NEGATIVE mg/dL   Hgb urine dipstick NEGATIVE NEGATIVE   Bilirubin Urine SMALL (A) NEGATIVE   Ketones, ur 20 (A) NEGATIVE mg/dL   Protein, ur 100 (A) NEGATIVE mg/dL   Nitrite NEGATIVE NEGATIVE   Leukocytes, UA NEGATIVE NEGATIVE   RBC / HPF 0-5 0 - 5 RBC/hpf   WBC, UA 0-5 0 - 5 WBC/hpf   Bacteria, UA RARE (A) NONE SEEN   Mucus PRESENT   I-Stat CG4 Lactic Acid, ED  Result Value Ref Range   Lactic Acid, Venous 2.43 (HH) 0.5 - 1.9 mmol/L  I-Stat CG4 Lactic Acid, ED  Result Value Ref Range   Lactic Acid, Venous 1.65 0.5 - 1.9 mmol/L   Laboratory interpretation all normal except initially elevated lactic acid that improved with IV fluids, urinalysis after 3 L IV fluids shows normal specific gravity.  His specific gravity on his urine with his last ED visit was very high at 1.046.  Hypokalemia, mild hyponatremia, persistent low total white blood cell count without neutropenia, stable anemia, stable low platelet count  EKG EKG Interpretation  Date/Time:  Wednesday March 16 2018 01:30:57 EST Ventricular Rate:  109 PR Interval:    QRS Duration: 94 QT Interval:  295 QTC Calculation: 398 R Axis:   54 Text Interpretation:  Sinus tachycardia Low voltage, extremity leads Nonspecific T abnormalities, diffuse leads Baseline wander in lead(s) V4 No significant change since last tracing 08 Mar 2018 Confirmed by Rolland Porter [phone]) on 03/16/2018 2:09:26 AM   Radiology Dg Chest 2 View  Result Date: 03/16/2018 CLINICAL DATA:   Left-sided chest pain and weakness, history of metastatic lung carcinoma EXAM: CHEST - 2 VIEW COMPARISON:  03/09/2018 FINDINGS: Cardiac shadow is within normal limits. Increased density is noted in the left lung base posteriorly consistent with the known mass lesion. Small left pleural effusion is  noted posteriorly. Left chest wall port is noted. Destructive lesion in the right scapula is noted similar to that seen on prior CT. IMPRESSION: Changes consistent with the known history of left lower lobe neoplasm with metastatic disease in the right scapula. Electronically Signed   By: Inez Catalina M.D.   On: 03/16/2018 02:51      Dg Chest 2 View  Result Date: 03/09/2018 CLINICAL DATA:  Weakness, lung cancer. Left-sided chest pain, onset today.  IMPRESSION: 1. No acute findings demonstrated radiographically. 2. Opacity in the left infrahilar region corresponds to known left lung mass, not well assessed on radiograph. 3. Known destructive lesion involving the right scapula. Electronically Signed   By: Keith Rake M.D.   On: 03/09/2018 00:10   Ct Angio Chest Pe W And/or Wo Contrast  Ct Abdomen Pelvis W Contrast  Result Date: 03/09/2018 CLINICAL DATA:  Shortness of breath and weakness.  IMPRESSION: 1. No pulmonary embolus or acute aortic syndrome. 2. Unchanged appearance of left lower lobe lung mass, associated endobronchial spread of tumor, and left lower lobe satellite nodule. 3. Unchanged right scapular metastasis. 4. New right adrenal mass, consistent with metastatic disease. 5. Multiple hypoattenuating liver lesions, consistent with hepatic metastatic disease. These may have been present previously, but undetectable on the earlier non-contrast examination. Aortic Atherosclerosis (ICD10-I70.0). Electronically Signed   By: Ulyses Jarred M.D.   On: 03/09/2018 02:17    Procedures Procedures (including critical care time)  Medications Ordered in ED Medications  sodium chloride 0.9 % bolus 1,000 mL  (1,000 mLs Intravenous New Bag/Given 03/16/18 0713)  sodium chloride 0.9 % bolus 1,000 mL (0 mLs Intravenous Stopped 03/16/18 0328)  sodium chloride 0.9 % bolus 1,000 mL (0 mLs Intravenous Stopped 03/16/18 0328)  ondansetron (ZOFRAN) injection 4 mg (4 mg Intravenous Given 03/16/18 0215)  potassium chloride SA (K-DUR,KLOR-CON) CR tablet 40 mEq (40 mEq Oral Given 03/16/18 0315)  sodium chloride 0.9 % bolus 1,000 mL (0 mLs Intravenous Stopped 03/16/18 0448)  sodium chloride 0.9 % bolus 1,000 mL (0 mLs Intravenous Stopped 03/16/18 0624)     Initial Impression / Assessment and Plan / ED Course  I have reviewed the triage vital signs and the nursing notes.  Pertinent labs & imaging results that were available during my care of the patient were reviewed by me and considered in my medical decision making (see chart for details).     Patient was given IV fluids and IV nausea medication.  He took his home pain medication orally.  Laboratory testing was done.  Patient's potassium was low and he was given oral potassium around 3 AM.  Recheck at 3:25 AM patient has had 2 L IV fluid, he has not had urinary output yet.  He states his mouth is still a little bit dry.  1/3 L of IV fluids was ordered.  He also was noted to have a low potassium and he was given oral potassium.  I had looked online for suggestions to help patient eat and drink when they have metallic taste after chemotherapy and this was given to the wife.  We discussed talking to her doctors about may be coming to get IV fluids as an outpatient until his appetite is better.  I gave the wife some information on dealing with the metal taste after chemotherapy from the Internet.  I also told her to talk to his cancer team about having him come in as an outpatient and get an IV fluids before he gets so bad.  Recheck at 5 AM he still has not had urinary output.  We encouraged him to try some ice chips although he did not seem like he wanted to.  He  was given another liter of IV fluids for #3.  Recheck at 6:10 AM patient has not had any urine output yet, I was going to have the nurse do a bladder scan but shortly after he did urinate 100 cc of urine.  Number 4 L of IV fluids ordered.  Recheck at 7:45 patient is feeling better.  His IV is almost finished and he will be discharged after that.  They were given more Zofran to use because wife states they use the last 1 this evening.  Final Clinical Impressions(s) / ED Diagnoses   Final diagnoses:  Dehydration    ED Discharge Orders         Ordered    ondansetron (ZOFRAN) 4 MG tablet  Every 8 hours PRN     03/16/18 0650         Plan discharge  Rolland Porter, MD, [place] discharge  Rolland Porter, MD, Barbette Or, MD 03/16/18 [phone]

## 2018-03-17 ENCOUNTER — Other Ambulatory Visit (HOSPITAL_COMMUNITY): Payer: Self-pay | Admitting: Hematology

## 2018-03-17 ENCOUNTER — Other Ambulatory Visit (HOSPITAL_COMMUNITY): Payer: Self-pay | Admitting: Nurse Practitioner

## 2018-03-17 DIAGNOSIS — C3492 Malignant neoplasm of unspecified part of left bronchus or lung: Secondary | ICD-10-CM

## 2018-03-17 DIAGNOSIS — G893 Neoplasm related pain (acute) (chronic): Secondary | ICD-10-CM

## 2018-03-18 ENCOUNTER — Other Ambulatory Visit (HOSPITAL_COMMUNITY): Payer: Self-pay | Admitting: *Deleted

## 2018-03-18 DIAGNOSIS — G893 Neoplasm related pain (acute) (chronic): Secondary | ICD-10-CM

## 2018-03-18 DIAGNOSIS — C3492 Malignant neoplasm of unspecified part of left bronchus or lung: Secondary | ICD-10-CM

## 2018-03-18 MED ORDER — ONDANSETRON 4 MG PO TBDP: 4.0000 mg | ORAL_TABLET | Freq: Three times a day (TID) | ORAL | 3 refills | Status: DC | PRN

## 2018-03-18 NOTE — Progress Notes (Signed)
Patient's daughter called stating that her dad has metallic taste in his mouth and nothing that he eats or drinks tastes good.  He has had significant decrease in his oral intake.  He has had several trips to the emergency room for fluid replacement the recent visit requiring 5 liters of fluid.  Patient has had minimal nausea/vomiting as the patch is working great for him. He denies any diarrhea. His only complaint is the taste and decrease in appetite.  I advised them to try and eat with plastic utensils as this can decrease the metallic taste some and try to eat the foods that are appealing to him.  I will share with Dr. Delton Coombes to see what other actions to take.   I have scheduled him for fluids next week.  They can not come on Monday, so he is scheduled for Tuesday with lab work.

## 2018-03-21 ENCOUNTER — Other Ambulatory Visit (HOSPITAL_COMMUNITY): Payer: Self-pay | Admitting: Nurse Practitioner

## 2018-03-21 DIAGNOSIS — C3492 Malignant neoplasm of unspecified part of left bronchus or lung: Secondary | ICD-10-CM

## 2018-03-21 MED ORDER — MORPHINE SULFATE 30 MG PO TABS: 30.0000 mg | ORAL_TABLET | Freq: Four times a day (QID) | ORAL | 0 refills | Status: AC | PRN

## 2018-03-21 MED ORDER — DRONABINOL 2.5 MG PO CAPS: 2.5000 mg | ORAL_CAPSULE | Freq: Two times a day (BID) | ORAL | 0 refills | Status: DC

## 2018-03-22 ENCOUNTER — Encounter (HOSPITAL_COMMUNITY): Payer: Self-pay

## 2018-03-22 ENCOUNTER — Inpatient Hospital Stay (HOSPITAL_COMMUNITY): Payer: Self-pay

## 2018-03-22 ENCOUNTER — Other Ambulatory Visit: Payer: Self-pay

## 2018-03-22 DIAGNOSIS — C3492 Malignant neoplasm of unspecified part of left bronchus or lung: Secondary | ICD-10-CM

## 2018-03-22 LAB — CBC WITH DIFFERENTIAL/PLATELET
Abs Immature Granulocytes: 0.45 10*3/uL — ABNORMAL HIGH (ref 0.00–0.07)
Basophils Absolute: 0.1 10*3/uL (ref 0.0–0.1)
Basophils Relative: 0 %
EOS ABS: 0 10*3/uL (ref 0.0–0.5)
Eosinophils Relative: 0 %
HCT: 32.9 % — ABNORMAL LOW (ref 39.0–52.0)
Hemoglobin: 10.7 g/dL — ABNORMAL LOW (ref 13.0–17.0)
Immature Granulocytes: 4 %
LYMPHS ABS: 1 10*3/uL (ref 0.7–4.0)
Lymphocytes Relative: 8 %
MCH: 28.2 pg (ref 26.0–34.0)
MCHC: 32.5 g/dL (ref 30.0–36.0)
MCV: 86.6 fL (ref 80.0–100.0)
Monocytes Absolute: 0.7 10*3/uL (ref 0.1–1.0)
Monocytes Relative: 6 %
Neutro Abs: 9.9 10*3/uL — ABNORMAL HIGH (ref 1.7–7.7)
Neutrophils Relative %: 82 %
Platelets: 675 10*3/uL — ABNORMAL HIGH (ref 150–400)
RBC: 3.8 MIL/uL — ABNORMAL LOW (ref 4.22–5.81)
RDW: 17.9 % — ABNORMAL HIGH (ref 11.5–15.5)
WBC: 12.2 10*3/uL — ABNORMAL HIGH (ref 4.0–10.5)
nRBC: 0 % (ref 0.0–0.2)

## 2018-03-22 LAB — COMPREHENSIVE METABOLIC PANEL
ALT: 19 U/L (ref 0–44)
AST: 17 U/L (ref 15–41)
Albumin: 2.8 g/dL — ABNORMAL LOW (ref 3.5–5.0)
Alkaline Phosphatase: 195 U/L — ABNORMAL HIGH (ref 38–126)
Anion gap: 10 (ref 5–15)
BUN: 12 mg/dL (ref 6–20)
CO2: 20 mmol/L — ABNORMAL LOW (ref 22–32)
Calcium: 8.8 mg/dL — ABNORMAL LOW (ref 8.9–10.3)
Chloride: 105 mmol/L (ref 98–111)
Creatinine, Ser: 0.87 mg/dL (ref 0.61–1.24)
GFR calc Af Amer: >60 mL/min (ref >60)
GFR calc non Af Amer: >60 mL/min (ref >60)
Glucose, Bld: 141 mg/dL — ABNORMAL HIGH (ref 70–99)
POTASSIUM: 3.9 mmol/L (ref 3.5–5.1)
Sodium: 135 mmol/L (ref 135–145)
Total Bilirubin: 0.6 mg/dL (ref 0.3–1.2)
Total Protein: 8.2 g/dL — ABNORMAL HIGH (ref 6.5–8.1)

## 2018-03-22 MED ORDER — HEPARIN SOD (PORK) LOCK FLUSH 100 UNIT/ML IV SOLN
500.0000 [IU] | Freq: Once | INTRAVENOUS | Status: AC
  Administered 2018-03-22: 500 [IU] via INTRAVENOUS

## 2018-03-22 MED ORDER — SODIUM CHLORIDE 0.9 % IV SOLN
INTRAVENOUS | Status: AC
  Administered 2018-03-22: 14:00:00 via INTRAVENOUS

## 2018-03-22 NOTE — Progress Notes (Unsigned)
Tolerated infusion w/o adverse reaction.  Alert, in no distress.  Discharged via wheelchair in c/o spouse.

## 2018-03-24 ENCOUNTER — Other Ambulatory Visit: Payer: Self-pay

## 2018-03-24 ENCOUNTER — Inpatient Hospital Stay (HOSPITAL_COMMUNITY): Payer: Self-pay | Attending: Hematology | Admitting: Hematology

## 2018-03-24 ENCOUNTER — Other Ambulatory Visit (HOSPITAL_COMMUNITY): Payer: Self-pay

## 2018-03-24 ENCOUNTER — Encounter (HOSPITAL_COMMUNITY): Payer: Self-pay | Admitting: Hematology

## 2018-03-24 ENCOUNTER — Inpatient Hospital Stay (HOSPITAL_COMMUNITY): Payer: Self-pay | Attending: Hematology

## 2018-03-24 DIAGNOSIS — Z9221 Personal history of antineoplastic chemotherapy: Secondary | ICD-10-CM | POA: Insufficient documentation

## 2018-03-24 DIAGNOSIS — C3492 Malignant neoplasm of unspecified part of left bronchus or lung: Secondary | ICD-10-CM | POA: Insufficient documentation

## 2018-03-24 DIAGNOSIS — Z5112 Encounter for antineoplastic immunotherapy: Secondary | ICD-10-CM | POA: Insufficient documentation

## 2018-03-24 DIAGNOSIS — F17210 Nicotine dependence, cigarettes, uncomplicated: Secondary | ICD-10-CM | POA: Insufficient documentation

## 2018-03-24 DIAGNOSIS — Z79899 Other long term (current) drug therapy: Secondary | ICD-10-CM | POA: Insufficient documentation

## 2018-03-24 DIAGNOSIS — R590 Localized enlarged lymph nodes: Secondary | ICD-10-CM | POA: Insufficient documentation

## 2018-03-24 DIAGNOSIS — C7931 Secondary malignant neoplasm of brain: Secondary | ICD-10-CM | POA: Insufficient documentation

## 2018-03-24 DIAGNOSIS — Z923 Personal history of irradiation: Secondary | ICD-10-CM | POA: Insufficient documentation

## 2018-03-24 DIAGNOSIS — C3432 Malignant neoplasm of lower lobe, left bronchus or lung: Secondary | ICD-10-CM | POA: Insufficient documentation

## 2018-03-24 DIAGNOSIS — Z5111 Encounter for antineoplastic chemotherapy: Secondary | ICD-10-CM | POA: Insufficient documentation

## 2018-03-24 DIAGNOSIS — R112 Nausea with vomiting, unspecified: Secondary | ICD-10-CM | POA: Insufficient documentation

## 2018-03-24 DIAGNOSIS — C7951 Secondary malignant neoplasm of bone: Secondary | ICD-10-CM | POA: Insufficient documentation

## 2018-03-24 DIAGNOSIS — Z7982 Long term (current) use of aspirin: Secondary | ICD-10-CM | POA: Insufficient documentation

## 2018-03-24 DIAGNOSIS — I10 Essential (primary) hypertension: Secondary | ICD-10-CM | POA: Insufficient documentation

## 2018-03-24 DIAGNOSIS — C787 Secondary malignant neoplasm of liver and intrahepatic bile duct: Secondary | ICD-10-CM | POA: Insufficient documentation

## 2018-03-24 DIAGNOSIS — M25511 Pain in right shoulder: Secondary | ICD-10-CM | POA: Insufficient documentation

## 2018-03-24 MED ORDER — SODIUM CHLORIDE 0.9% FLUSH
10.0000 mL | INTRAVENOUS | Status: DC | PRN
  Administered 2018-03-24: 10 mL via INTRAVENOUS
  Filled 2018-03-24: qty 10

## 2018-03-24 MED ORDER — HEPARIN SOD (PORK) LOCK FLUSH 100 UNIT/ML IV SOLN
500.0000 [IU] | Freq: Once | INTRAVENOUS | Status: AC
  Administered 2018-03-24: 500 [IU] via INTRAVENOUS

## 2018-03-24 MED ORDER — SODIUM CHLORIDE 0.9 % IV SOLN
INTRAVENOUS | Status: DC
  Administered 2018-03-24: 13:00:00 via INTRAVENOUS

## 2018-03-24 MED ORDER — SODIUM CHLORIDE 0.9 % IV SOLN
8.0000 mg | Freq: Once | INTRAVENOUS | Status: AC
  Administered 2018-03-24: 8 mg via INTRAVENOUS
  Filled 2018-03-24: qty 4

## 2018-03-24 MED ORDER — SODIUM CHLORIDE 0.9 % IV SOLN
INTRAVENOUS | Status: DC
  Administered 2018-03-24: 14:00:00 via INTRAVENOUS
  Filled 2018-03-24 (×14): qty 1000

## 2018-03-24 NOTE — Assessment & Plan Note (Addendum)
1.  Metastatic adenocarcinoma of the lung, with brain mets: -PDL 1 TPS of 0%, foundation 1 without any actionable mutations. - Presentation to the ER on 01/22/2018 with right shoulder pain of one-month duration. - 60-pack-year smoker. -CT scan of the shoulder shows large destructive lytic lesion involving the glenoid and the coracoid. - CT CAP on 01/28/2018 shows 5.1 x 3.6 cm left lower lobe mass, left hilar and mediastinal lymphadenopathy, 13 mm low-attenuation lesion in the liver, large destructive bone lesion involving the right scapula, no evidence of other bone lesions, bilateral iliac and inguinal lymphadenopathy. - We discussed the results of the biopsy of the right scapular lesion dated 02/08/2018 which was consistent with metastatic adenocarcinoma.  CK7 was positive.  CDX 2 was positive and a subset of tumor cells.  CD20, TTF-1, Napsin-A, p63, CK 5/6, are negative.  This was consistent with enteric differentiation of lung cancer. -MRI of the brain on 02/03/2018 showed more than 20 metastasis, measuring up to 7 mm throughout the brain.  There is mild edema associated with the larger lesions.  No mass-effect.  - He received radiation therapy to the whole brain and right scapula from 02/14/2018 through 03/01/2018.  -He had developed intractable nausea after radiation. - Cycle 1 of carboplatin and pemetrexed on 02/26/2018. - He went to the ER on 03/08/2018 and 03/16/2018 with dehydration and weakness.  He was given some fluids. - He has lost about 15 pounds since cycle 1.  He is not eating any solid foods.  He does not have appetite.  We started him on Marinol which he took for 1 day. - I would hold off on cycle 2 today.  He will receive IV fluids.  He will also receive Zofran. -I will reevaluate him in 1 week.  I plan to add pembrolizumab to his chemotherapy regimen.  2.  Right shoulder pain: - He received palliative radiation.  He is taking morphine 30 mg every 6 hours.  3.  Intractable nausea  and vomiting: - He is currently using Zofran as needed at home.  He is also on Decadron 4 mg in the mornings. -He is also wearing scopolamine patch. -He gets nauseous only after traveling in a car.  If he is resting at home, he does not have much of nausea.

## 2018-03-24 NOTE — Progress Notes (Signed)
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  CURRENT THERAPY: radiation started 02/14/18   BRIEF ONCOLOGIC HISTORY:    Metastatic lung cancer (metastasis from lung to other site), left ([place])   01/31/2018 Initial Diagnosis    Metastatic lung cancer (metastasis from lung to other site), left ([place])    108/04/2017 -  Chemotherapy    The patient had palonosetron (ALOXI) injection 0.25 mg, 0.25 mg, Intravenous,  Once, 1 of 4 cycles Administration: 0.25 mg (108/04/2017) PEMEtrexed (ALIMTA) 1,000 mg in sodium chloride 0.9 % 100 mL chemo infusion, 500 mg/m2 = 1,000 mg, Intravenous,  Once, 1 of 6 cycles Administration: 1,000 mg (108/04/2017) CARBOplatin (PARAPLATIN) 550 mg in sodium chloride 0.9 % 250 mL chemo infusion, 550 mg (100 % of original dose 550 mg), Intravenous,  Once, 1 of 4 cycles Dose modification:   (original dose 550 mg, Cycle 1) Administration: 550 mg (108/04/2017) pembrolizumab (KEYTRUDA) 200 mg in sodium chloride 0.9 % 50 mL chemo infusion, 200 mg, Intravenous, Once, 0 of 5 cycles fosaprepitant (EMEND) 150 mg, dexamethasone (DECADRON) 12 mg in sodium chloride 0.9 % 145 mL IVPB, , Intravenous,  Once, 1 of 4 cycles Administration:  (108/04/2017)  for chemotherapy treatment.       INTERVAL HISTORY:  Ian Roberts 58 y.o. 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    REVIEW OF SYSTEMS:  Review of Systems  Constitutional: Positive for fatigue.  Gastrointestinal: Positive for nausea and vomiting.  Neurological: Positive for extremity weakness.  All other systems reviewed and are negative.    PAST MEDICAL/SURGICAL HISTORY:  Past Medical History:  Diagnosis Date  .  Bone lesion    right shoulder found on x-ray  . CAD (coronary artery disease)   . Dyslipidemia   . Hypertension   . Hypokalemia   . Necrotizing fasciitis ([place])   . Rotator cuff tear   . ST elevation myocardial infarction (STEMI) of inferior wall (HCC)    Assocaited with paroxysmal atrial fibrillation 05/13/2008; Xience drug eluting stent  . Stroke [facility])    Past Surgical History:  Procedure Laterality Date  . CORONARY ANGIOPLASTY WITH STENT PLACEMENT    . CYST EXCISION    . IR IMAGING GUIDED PORT INSERTION  02/11/2018  . TONSILLECTOMY       SOCIAL HISTORY:  Social History   Socioeconomic History  . Marital status: Married    Spouse name: Not on file  . Number of children: 2  . Years of education: Not on file  . Highest education level: Not on file  Occupational History  . Occupation: Full time Dealer    Comment: unemployed  Social Needs  . Financial resource strain: Not very hard  . Food insecurity:    Worry: Never true    Inability: Never true  . Transportation needs:    Medical: No    Non-medical: No  Tobacco Use  . Smoking status: Current Every Day Smoker    Packs/day: 1.50    Years: 41.00    Pack years: 61.50    Types: Cigarettes  . Smokeless tobacco: Never Used  Substance and Sexual Activity  . Alcohol use: Never    Frequency: Never  . Drug use: Never  .  Sexual activity: Not Currently  Lifestyle  . Physical activity:    Days per week: 0 days    Minutes per session: 0 min  . Stress: To some extent  Relationships  . Social connections:    Talks on phone: More than three times a week    Gets together: More than three times a week    Attends religious service: Never    Active member of club or organization: No    Attends meetings of clubs or organizations: Never    Relationship status: Married  . Intimate partner violence:    Fear of current or ex partner: No    Emotionally abused: No    Physically abused: No    Forced sexual activity: No    Other Topics Concern  . Not on file  Social History Narrative   Married    FAMILY HISTORY:  Family History  Problem Relation Age of Onset  . Diabetes Mother   . Hypertension Mother   . Lung cancer Father     CURRENT MEDICATIONS:  Outpatient Encounter Medications as of 03/24/2018  Medication Sig  . amLODipine (NORVASC) 10 MG tablet Take 10 mg by mouth daily.   [place] Kitchen aspirin EC 325 MG tablet Take 325 mg by mouth daily.   [place] Kitchen CARBOPLATIN IV Inject into the vein.  [place] Kitchen dexamethasone (DECADRON) 4 MG tablet Take 1 tablet (4 mg total) by mouth daily.  [place] Kitchen docusate sodium (COLACE) 100 MG capsule Take 100 mg by mouth daily.   [place] Kitchen dronabinol (MARINOL) 2.5 MG capsule Take 1 capsule (2.5 mg total) by mouth 2 (two) times daily before a meal.  . folic acid (FOLVITE) 1 MG tablet Take 1 tablet (1 mg total) by mouth daily.  . hydrochlorothiazide (HYDRODIURIL) 25 MG tablet Take 25 mg by mouth daily.   [place] Kitchen ibuprofen (ADVIL,MOTRIN) 200 MG tablet Take 600 mg by mouth every 6 (six) hours as needed for headache or moderate pain.   [place] Kitchen lidocaine-prilocaine (EMLA) cream Apply small amount over port site and cover with plastic wrap one hour prior to appointment.  [place] Kitchen lisinopril (PRINIVIL,ZESTRIL) 20 MG tablet Take 20 mg by mouth daily.  . memantine (NAMENDA) 5 MG tablet TAKE 1 TABLET IN THE MORNING FOR 7 DAYS, 1 TABLET IN THE MORNING & 1 TABLET IN THE EVENING FOR 7 DAYS, 2 TABS IN THE MORNING & 1 TABLET IN T  . morphine (MSIR) 30 MG tablet Take 1 tablet (30 mg total) by mouth every 6 (six) hours as needed for severe pain.  [place] Kitchen ondansetron (ZOFRAN ODT) 4 MG disintegrating tablet Take 1 tablet (4 mg total) by mouth every 8 (eight) hours as needed for nausea or vomiting.  . ondansetron (ZOFRAN) 4 MG tablet Take 1 tablet (4 mg total) by mouth every 8 (eight) hours as needed for nausea or vomiting.  . ondansetron (ZOFRAN-ODT) 4 MG disintegrating tablet TAKE 1 TABLET BY MOUTH EVERY 8 HOURS AS NEEDED FOR NAUSEA OR VOMITING  .  PEMBROLIZUMAB IV Inject into the vein.  [place] Kitchen PEMEtrexed 500 mg/m2 in sodium chloride 0.9 % 100 mL Inject 500 mg/m2 into the vein once.  . prochlorperazine (COMPAZINE) 10 MG tablet Take 1 tablet (10 mg total) by mouth every 6 (six) hours as needed for nausea or vomiting.  [place] Kitchen scopolamine (TRANSDERM-SCOP) 1 MG/3DAYS Place 1 patch (1.5 mg total) onto the skin every 3 (three) days.  . simvastatin (ZOCOR) 20 MG tablet Take 20 mg by mouth daily.    No facility-administered encounter medications on  file as of 03/24/2018.     ALLERGIES:  Allergies  Allergen Reactions  . Hydrocodone Hives     PHYSICAL EXAM:  ECOG Performance status: 2 I have reviewed his vitals.  Blood pressure is 103/68.  Pulse is 95.  Respiratory rate is 18.  Temperature is 97.7.  Saturations are 99%. Physical Exam Constitutional:      Appearance: He is ill-appearing.  HENT:     Mouth/Throat:     Mouth: Mucous membranes are moist.     Pharynx: Oropharynx is clear.  Cardiovascular:     Rate and Rhythm: Normal rate and regular rhythm.  Pulmonary:     Effort: Pulmonary effort is normal.     Breath sounds: Normal breath sounds.  Neurological:     General: No focal deficit present.     Mental Status: He is alert and oriented to person, place, and time.      LABORATORY DATA:  I have reviewed the labs as listed.  CBC    Component Value Date/Time   WBC 12.2 (H) 03/22/2018 1303   RBC 3.80 (L) 03/22/2018 1303   HGB 10.7 (L) 03/22/2018 1303   HCT 32.9 (L) 03/22/2018 1303   PLT 675 (H) 03/22/2018 1303   MCV 86.6 03/22/2018 1303   MCH 28.2 03/22/2018 1303   MCHC 32.5 03/22/2018 1303   RDW 17.9 (H) 03/22/2018 1303   LYMPHSABS 1.0 03/22/2018 1303   MONOABS 0.7 03/22/2018 1303   EOSABS 0.0 03/22/2018 1303   BASOSABS 0.1 03/22/2018 1303   CMP Latest Ref Rng & Units 03/22/2018 03/16/2018 03/09/2018  Glucose 70 - 99 mg/dL 141(H) 121(H) 151(H)  BUN 6 - 20 mg/dL 12 17 31(H)  Creatinine 0.61 - 1.24 mg/dL 0.87 0.76 0.93    Sodium 135 - 145 mmol/L 135 131(L) 133(L)  Potassium 3.5 - 5.1 mmol/L 3.9 3.1(L) 3.9  Chloride 98 - 111 mmol/L 105 99 101  CO2 22 - 32 mmol/L 20(L) 20(L) 21(L)  Calcium 8.9 - 10.3 mg/dL 8.8(L) 8.4(L) 8.4(L)  Total Protein 6.5 - 8.1 g/dL 8.2(H) 8.5(H) 7.8  Total Bilirubin 0.3 - 1.2 mg/dL 0.6 2.7(H) 1.9(H)  Alkaline Phos 38 - 126 U/L 195(H) 228(H) 227(H)  AST 15 - 41 U/L 17 28 19   ALT 0 - 44 U/L 19 37 20       DIAGNOSTIC IMAGING:  I have independently reviewed the scans and discussed with the patient.   I have reviewed Francene Finders, NP's note and agree with the documentation.  I personally performed a face-to-face visit, made revisions and my assessment and plan is as follows.    ASSESSMENT & PLAN:   Metastatic lung cancer (metastasis from lung to other site), left ([place]) 1.  Metastatic adenocarcinoma of the lung, with brain mets: -PDL 1 TPS of 0%, foundation 1 without any actionable mutations. - Presentation to the ER on 01/22/2018 with right shoulder pain of one-month duration. - 60-pack-year smoker. -CT scan of the shoulder shows large destructive lytic lesion involving the glenoid and the coracoid. - CT CAP on 01/28/2018 shows 5.1 x 3.6 cm left lower lobe mass, left hilar and mediastinal lymphadenopathy, 13 mm low-attenuation lesion in the liver, large destructive bone lesion involving the right scapula, no evidence of other bone lesions, bilateral iliac and inguinal lymphadenopathy. - We discussed the results of the biopsy of the right scapular lesion dated 02/08/2018 which was consistent with metastatic adenocarcinoma.  CK7 was positive.  CDX 2 was positive and a subset of tumor cells.  CD20,  TTF-1, Napsin-A, p63, CK 5/6, are negative.  This was consistent with enteric differentiation of lung cancer. -MRI of the brain on 02/03/2018 showed more than 20 metastasis, measuring up to 7 mm throughout the brain.  There is mild edema associated with the larger lesions.  No mass-effect.   - He received radiation therapy to the whole brain and right scapula from 02/14/2018 through 03/01/2018.  -He had developed intractable nausea after radiation. - Cycle 1 of carboplatin and pemetrexed on 02/26/2018. - He went to the ER on 03/08/2018 and 03/16/2018 with dehydration and weakness.  He was given some fluids. - He has lost about 15 pounds since cycle 1.  He is not eating any solid foods.  He does not have appetite.  We started him on Marinol which he took for 1 day. - I would hold off on cycle 2 today.  He will receive IV fluids.  He will also receive Zofran. -I will reevaluate him in 1 week.  I plan to add pembrolizumab to his chemotherapy regimen.  2.  Right shoulder pain: - He received palliative radiation.  He is taking morphine 30 mg every 6 hours.  3.  Intractable nausea and vomiting: - He is currently using Zofran as needed at home.  He is also on Decadron 4 mg in the mornings. -He is also wearing scopolamine patch. -He gets nauseous only after traveling in a car.  If he is resting at home, he does not have much of nausea.        Orders placed this encounter:  No orders of the defined types were placed in this encounter.     Derek Jack, MD [place] [phone]

## 2018-03-24 NOTE — Progress Notes (Signed)
[place] reviewed with and pt seen by Dr. Delton Coombes and pt's chemo tx to be held today with hydration and antiemetic to be given per MD orders                                                                         Josefine Class tolerated hydration with potassium and magnesium, and antiemetic well without complaints or incident. VSS upon discharge. Pt discharged via wheelchair in stable condition accompanied by his wife

## 2018-03-24 NOTE — Patient Instructions (Signed)
[place] at [facility] Discharge Instructions  Received hydration with magnesium and potassium as well as antiemetic given IV. Follow-up as scheduled. Call clinic for any questions or concerns   Thank you for choosing [place] at [facility] to provide your oncology and hematology care.  To afford each patient quality time with our provider, please arrive at least 15 minutes before your scheduled appointment time.   If you have a lab appointment with the [facility] please come in thru the  Main Entrance and check in at the main information desk  You need to re-schedule your appointment should you arrive 10 or more minutes late.  We strive to give you quality time with our providers, and arriving late affects you and other patients whose appointments are after yours.  Also, if you no show three or more times for appointments you may be dismissed from the clinic at the providers discretion.     Again, thank you for choosing [facility].  Our hope is that these requests will decrease the amount of time that you wait before being seen by our physicians.       _____________________________________________________________  Should you have questions after your visit to [facility], please contact our office at [phone] between the hours of 8:00 a.m. and 4:30 p.m.  Voicemails left after 4:00 p.m. will not be returned until the following business day.  For prescription refill requests, have your pharmacy contact our office and allow 72 hours.    Cancer Center Support Programs:   > Cancer Support Group  2nd Tuesday of the month 1pm-2pm, Journey Room

## 2018-03-24 NOTE — Patient Instructions (Addendum)
[place] at [facility] Discharge Instructions  Follow up in 1 week with labs and treatment.    Thank you for choosing [place] at [facility] to provide your oncology and hematology care.  To afford each patient quality time with our provider, please arrive at least 15 minutes before your scheduled appointment time.   If you have a lab appointment with the [place] please come in thru the  Main Entrance and check in at the main information desk  You need to re-schedule your appointment should you arrive 10 or more minutes late.  We strive to give you quality time with our providers, and arriving late affects you and other patients whose appointments are after yours.  Also, if you no show three or more times for appointments you may be dismissed from the clinic at the providers discretion.     Again, thank you for choosing [facility].  Our hope is that these requests will decrease the amount of time that you wait before being seen by our physicians.       _____________________________________________________________  Should you have questions after your visit to [facility], please contact our office at [phone] between the hours of 8:00 a.m. and 4:30 p.m.  Voicemails left after 4:00 p.m. will not be returned until the following business day.  For prescription refill requests, have your pharmacy contact our office and allow 72 hours.    Cancer Center Support Programs:   > Cancer Support Group  2nd Tuesday of the month 1pm-2pm, Journey Room

## 2018-03-27 NOTE — ED Notes (Addendum)
Unable to enter 300 code in I-stat machine but Dr. Rolland Porter notified verbal of I-stat lactic of 2.43 which was previously charted in the critical value comments at 03:04. Dr. Tomi Bamberger notified of the second I-stat lactic of 1.65 within normal range.

## 2018-03-31 ENCOUNTER — Inpatient Hospital Stay (HOSPITAL_COMMUNITY): Payer: Self-pay

## 2018-03-31 ENCOUNTER — Other Ambulatory Visit: Payer: Self-pay

## 2018-03-31 ENCOUNTER — Encounter (HOSPITAL_COMMUNITY): Payer: Self-pay

## 2018-03-31 ENCOUNTER — Inpatient Hospital Stay (HOSPITAL_BASED_OUTPATIENT_CLINIC_OR_DEPARTMENT_OTHER): Payer: Self-pay | Admitting: Hematology

## 2018-03-31 ENCOUNTER — Encounter (HOSPITAL_COMMUNITY): Payer: Self-pay | Admitting: Hematology

## 2018-03-31 VITALS — BP 93/54 | HR 81 | Temp 97.8°F | Resp 18 | Ht 71.0 in | Wt 157.4 lb

## 2018-03-31 DIAGNOSIS — R112 Nausea with vomiting, unspecified: Secondary | ICD-10-CM

## 2018-03-31 DIAGNOSIS — C7931 Secondary malignant neoplasm of brain: Secondary | ICD-10-CM

## 2018-03-31 DIAGNOSIS — Z9221 Personal history of antineoplastic chemotherapy: Secondary | ICD-10-CM

## 2018-03-31 DIAGNOSIS — C7951 Secondary malignant neoplasm of bone: Secondary | ICD-10-CM

## 2018-03-31 DIAGNOSIS — Z923 Personal history of irradiation: Secondary | ICD-10-CM

## 2018-03-31 DIAGNOSIS — F17210 Nicotine dependence, cigarettes, uncomplicated: Secondary | ICD-10-CM

## 2018-03-31 DIAGNOSIS — C787 Secondary malignant neoplasm of liver and intrahepatic bile duct: Secondary | ICD-10-CM

## 2018-03-31 DIAGNOSIS — C3492 Malignant neoplasm of unspecified part of left bronchus or lung: Secondary | ICD-10-CM

## 2018-03-31 LAB — CBC WITH DIFFERENTIAL/PLATELET
Abs Immature Granulocytes: 0.15 10*3/uL — ABNORMAL HIGH (ref 0.00–0.07)
Basophils Absolute: 0 10*3/uL (ref 0.0–0.1)
Basophils Relative: 0 %
EOS ABS: 0 10*3/uL (ref 0.0–0.5)
Eosinophils Relative: 0 %
HCT: 33 % — ABNORMAL LOW (ref 39.0–52.0)
Hemoglobin: 10.4 g/dL — ABNORMAL LOW (ref 13.0–17.0)
Immature Granulocytes: 1 %
Lymphocytes Relative: 6 %
Lymphs Abs: 0.9 10*3/uL (ref 0.7–4.0)
MCH: 27.6 pg (ref 26.0–34.0)
MCHC: 31.5 g/dL (ref 30.0–36.0)
MCV: 87.5 fL (ref 80.0–100.0)
Monocytes Absolute: 1.1 10*3/uL — ABNORMAL HIGH (ref 0.1–1.0)
Monocytes Relative: 7 %
Neutro Abs: 13.5 10*3/uL — ABNORMAL HIGH (ref 1.7–7.7)
Neutrophils Relative %: 86 %
Platelets: 305 10*3/uL (ref 150–400)
RBC: 3.77 MIL/uL — AB (ref 4.22–5.81)
RDW: 22.1 % — ABNORMAL HIGH (ref 11.5–15.5)
WBC: 15.7 10*3/uL — ABNORMAL HIGH (ref 4.0–10.5)
nRBC: 0 % (ref 0.0–0.2)

## 2018-03-31 LAB — COMPREHENSIVE METABOLIC PANEL
ALT: 11 U/L (ref 0–44)
AST: 16 U/L (ref 15–41)
Albumin: 2.6 g/dL — ABNORMAL LOW (ref 3.5–5.0)
Alkaline Phosphatase: 219 U/L — ABNORMAL HIGH (ref 38–126)
Anion gap: 12 (ref 5–15)
BUN: 13 mg/dL (ref 6–20)
CALCIUM: 8.3 mg/dL — AB (ref 8.9–10.3)
CO2: 22 mmol/L (ref 22–32)
Chloride: 96 mmol/L — ABNORMAL LOW (ref 98–111)
Creatinine, Ser: 0.82 mg/dL (ref 0.61–1.24)
GFR calc Af Amer: >60 mL/min (ref >60)
GFR calc non Af Amer: >60 mL/min (ref >60)
Glucose, Bld: 133 mg/dL — ABNORMAL HIGH (ref 70–99)
Potassium: 3.7 mmol/L (ref 3.5–5.1)
Sodium: 130 mmol/L — ABNORMAL LOW (ref 135–145)
Total Bilirubin: 1.9 mg/dL — ABNORMAL HIGH (ref 0.3–1.2)
Total Protein: 8 g/dL (ref 6.5–8.1)

## 2018-03-31 MED ORDER — SODIUM CHLORIDE 0.9 % IV SOLN
500.0000 mg | Freq: Once | INTRAVENOUS | Status: AC
  Administered 2018-03-31: 500 mg via INTRAVENOUS
  Filled 2018-03-31: qty 50

## 2018-03-31 MED ORDER — HEPARIN SOD (PORK) LOCK FLUSH 100 UNIT/ML IV SOLN
500.0000 [IU] | Freq: Once | INTRAVENOUS | Status: AC | PRN
  Administered 2018-03-31: 500 [IU]

## 2018-03-31 MED ORDER — SODIUM CHLORIDE 0.9 % IV SOLN
Freq: Once | INTRAVENOUS | Status: AC
  Administered 2018-03-31: 13:00:00 via INTRAVENOUS
  Filled 2018-03-31: qty 5

## 2018-03-31 MED ORDER — SODIUM CHLORIDE 0.9 % IV SOLN
500.0000 mg/m2 | Freq: Once | INTRAVENOUS | Status: AC
  Administered 2018-03-31: 1000 mg via INTRAVENOUS
  Filled 2018-03-31: qty 40

## 2018-03-31 MED ORDER — SODIUM CHLORIDE 0.9 % IV SOLN
Freq: Once | INTRAVENOUS | Status: AC
  Administered 2018-03-31: 12:00:00 via INTRAVENOUS

## 2018-03-31 MED ORDER — SODIUM CHLORIDE 0.9 % IV SOLN
200.0000 mg | Freq: Once | INTRAVENOUS | Status: AC
  Administered 2018-03-31: 200 mg via INTRAVENOUS
  Filled 2018-03-31: qty 8

## 2018-03-31 MED ORDER — SODIUM CHLORIDE 0.9% FLUSH
10.0000 mL | INTRAVENOUS | Status: DC | PRN
  Administered 2018-03-31: 10 mL
  Filled 2018-03-31: qty 10

## 2018-03-31 MED ORDER — PALONOSETRON HCL INJECTION 0.25 MG/5ML
INTRAVENOUS | Status: AC
  Filled 2018-03-31: qty 5

## 2018-03-31 MED ORDER — PALONOSETRON HCL INJECTION 0.25 MG/5ML
0.2500 mg | Freq: Once | INTRAVENOUS | Status: AC
  Administered 2018-03-31: 0.25 mg via INTRAVENOUS

## 2018-03-31 NOTE — Patient Instructions (Signed)
[facility] Discharge Instructions for Patients Receiving Chemotherapy   Beginning January 23rd 2017 lab work for the [facility] will be done in the  Main lab at [facility] on 1st floor. If you have a lab appointment with the [place] please come in thru the  Main Entrance and check in at the main information desk   Today you received the following chemotherapy agents Carboplatin, Alimta, and Keytruda  To help prevent nausea and vomiting after your treatment, we encourage you to take your nausea medication   If you develop nausea and vomiting, or diarrhea that is not controlled by your medication, call the clinic.  The clinic phone number is [phone]. Office hours are Monday-Friday 8:30am-5:00pm.  BELOW ARE SYMPTOMS THAT SHOULD BE REPORTED IMMEDIATELY:  *FEVER GREATER THAN 101.0 F  *CHILLS WITH OR WITHOUT FEVER  NAUSEA AND VOMITING THAT IS NOT CONTROLLED WITH YOUR NAUSEA MEDICATION  *UNUSUAL SHORTNESS OF BREATH  *UNUSUAL BRUISING OR BLEEDING  TENDERNESS IN MOUTH AND THROAT WITH OR WITHOUT PRESENCE OF ULCERS  *URINARY PROBLEMS  *BOWEL PROBLEMS  UNUSUAL RASH Items with * indicate a potential emergency and should be followed up as soon as possible. If you have an emergency after office hours please contact your primary care physician or go to the nearest emergency department.  Please call the clinic during office hours if you have any questions or concerns.   You may also contact the Patient Navigator at [phone] should you have any questions or need assistance in obtaining follow up care.      Resources For Cancer Patients and their Caregivers ? American Cancer Society: Can assist with transportation, wigs, general needs, runs Look Good Feel Better.        979 406 3836 ? Cancer Care: Provides financial assistance, online support groups, medication/co-pay assistance.  1-800-813-HOPE (425)452-8016) ? [place] Assists [place] cancer patients and their families through emotional , educational and financial support.  603-594-5223 ? [place] DSS Where to apply for food stamps, Medicaid and utility assistance. 859-427-4822 ? [facility]: Transportation to medical appointments. (947) 456-1571 ? Social Security Administration: May apply for disability if have a Stage IV cancer. 940-164-4491 (863)131-5640 ? [employer], Disability and Transit Services: Assists with nutrition, care and transit needs. [phone]

## 2018-03-31 NOTE — Assessment & Plan Note (Signed)
1.  Metastatic adenocarcinoma of the lung, with brain mets: -PDL 1 TPS of 0%, foundation 1 without any actionable mutations. - Presentation to the ER on 01/22/2018 with right shoulder pain of one-month duration. - 60-pack-year smoker. -CT scan of the shoulder shows large destructive lytic lesion involving the glenoid and the coracoid. - CT CAP on 01/28/2018 shows 5.1 x 3.6 cm left lower lobe mass, left hilar and mediastinal lymphadenopathy, 13 mm low-attenuation lesion in the liver, large destructive bone lesion involving the right scapula, no evidence of other bone lesions, bilateral iliac and inguinal lymphadenopathy. - We discussed the results of the biopsy of the right scapular lesion dated 02/08/2018 which was consistent with metastatic adenocarcinoma.  CK7 was positive.  CDX 2 was positive and a subset of tumor cells.  CD20, TTF-1, Napsin-A, p63, CK 5/6, are negative.  This was consistent with enteric differentiation of lung cancer. -MRI of the brain on 02/03/2018 showed more than 20 metastasis, measuring up to 7 mm throughout the brain.  There is mild edema associated with the larger lesions.  No mass-effect.  - He received radiation therapy to the whole brain and right scapula from 02/14/2018 through 03/01/2018.  -He had developed intractable nausea after radiation. - Cycle 1 of carboplatin and pemetrexed on 02/26/2018. - He went to the ER on 03/08/2018 and 03/16/2018 with dehydration and weakness.  He was given some fluids. - He lost 15 pounds from cycle 1. -We started him on Marinol a week ago.  He is taking 2.5 mg twice daily.  His appetite has picked up according to his family members. -I have increased Marinol to 5 mg twice a day.  He lost 2 pounds in the last 1 week.  He has 1+ edema in the legs.  His albumin is low at 2.4.  His bilirubin is slightly high at 1.9. -We will proceed with cycle 2 today.  I will give a flat dose of carboplatin of 500 mg today. -We will also introduce  pembrolizumab.  We talked about the immunotherapy related side effects in detail. -We will arrange him for IV fluids next Monday.  I will see him back in 3 weeks for follow-up.  2.  Right shoulder pain: - He received palliative radiation.  He is not requiring morphine 30 mg every 6 hours on a consistent basis.  3.  Intractable nausea and vomiting: - He is currently using Zofran as needed at home.  He is also on Decadron 4 mg in the mornings. -Scopolamine patch is also helping. -Nausea is very well controlled with this regimen.  He gets nauseous only after traveling in a car.

## 2018-03-31 NOTE — Patient Instructions (Signed)
[facility] at [facility] Discharge Instructions     Thank you for choosing  [facility] at [facility] to provide your oncology and hematology care.  To afford each patient quality time with our provider, please arrive at least 15 minutes before your scheduled appointment time.   If you have a lab appointment with the Cancer Center please come in thru the  Main Entrance and check in at the main information desk  You need to re-schedule your appointment should you arrive 10 or more minutes late.  We strive to give you quality time with our providers, and arriving late affects you and other patients whose appointments are after yours.  Also, if you no show three or more times for appointments you may be dismissed from the clinic at the providers discretion.     Again, thank you for choosing [facility].  Our hope is that these requests will decrease the amount of time that you wait before being seen by our physicians.       _____________________________________________________________  Should you have questions after your visit to [facility], please contact our office at [phone] between the hours of 8:00 a.m. and 4:30 p.m.  Voicemails left after 4:00 p.m. will not be returned until the following business day.  For prescription refill requests, have your pharmacy contact our office and allow 72 hours.    Cancer Center Support Programs:   > Cancer Support Group  2nd Tuesday of the month 1pm-2pm, Journey Room    

## 2018-03-31 NOTE — Progress Notes (Signed)
Pt presents today for follow up with Dr. Delton Coombes. VSS. No complaints of pain today. Labs drawn prior to visit. Pt complains of slight nausea that is controlled with Zofran.

## 2018-03-31 NOTE — Progress Notes (Signed)
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  CURRENT THERAPY:Carboplatin, pemetrexed and pembrolizumab.  BRIEF ONCOLOGIC HISTORY:    Metastatic lung cancer (metastasis from lung to other site), left ([place])   01/31/2018 Initial Diagnosis    Metastatic lung cancer (metastasis from lung to other site), left ([place])    12020/01/2418 -  Chemotherapy    The patient had palonosetron (ALOXI) injection 0.25 mg, 0.25 mg, Intravenous,  Once, 2 of 4 cycles Administration: 0.25 mg (12020/01/2418) PEMEtrexed (ALIMTA) 1,000 mg in sodium chloride 0.9 % 100 mL chemo infusion, 500 mg/m2 = 1,000 mg, Intravenous,  Once, 2 of 6 cycles Administration: 1,000 mg (12020/01/2418) CARBOplatin (PARAPLATIN) 550 mg in sodium chloride 0.9 % 250 mL chemo infusion, 550 mg (100 % of original dose 550 mg), Intravenous,  Once, 2 of 4 cycles Dose modification:   (original dose 550 mg, Cycle 1), 500 mg (original dose 500 mg, Cycle 2) Administration: 550 mg (12020/01/2418) pembrolizumab (KEYTRUDA) 200 mg in sodium chloride 0.9 % 50 mL chemo infusion, 200 mg, Intravenous, Once, 1 of 5 cycles fosaprepitant (EMEND) 150 mg, dexamethasone (DECADRON) 12 mg in sodium chloride 0.9 % 145 mL IVPB, , Intravenous,  Once, 2 of 4 cycles Administration:  (12020/01/2418)  for chemotherapy treatment.       INTERVAL HISTORY:  Ian Roberts 58 y.o. 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 He is in a wheelchair and still very weak. His appetite has improved since he started taking the Marinol and they have just increased the dose starting today. He is very fatigue and unable to do any activities during the day. He only has nausea after he travels in  the car and with motion. His pain is slightly improved and he is not requiring his medication every 6 hours. Denies any vomiting or diarrhea. Denies any new pains. Had not noticed any recent bleeding such as epistaxis, hematuria or hematochezia. Denies recent chest pain on exertion, shortness of breath on minimal exertion, pre-syncopal episodes, or palpitations. Denies any numbness or tingling in hands or feet. Denies any recent fevers, infections, or recent hospitalizations. He reports his appetite and energy level at 25%. He did loose a few pound since his last visit.     REVIEW OF SYSTEMS:  Review of Systems  Constitutional: Positive for fatigue.  Gastrointestinal: Positive for nausea.  Neurological: Positive for extremity weakness.  All other systems reviewed and are negative.    PAST MEDICAL/SURGICAL HISTORY:  Past Medical History:  Diagnosis Date  . Bone lesion    right shoulder found on x-ray  . CAD (coronary artery disease)   . Dyslipidemia   . Hypertension   . Hypokalemia   . Necrotizing fasciitis ([place])   . Rotator cuff tear   . ST elevation myocardial infarction (STEMI) of inferior wall ([facility])    Assocaited with paroxysmal atrial fibrillation 05/13/2008; Xience drug eluting stent  . Stroke [facility])    Past Surgical History:  Procedure Laterality Date  . CORONARY ANGIOPLASTY WITH STENT PLACEMENT    . CYST EXCISION    . IR IMAGING GUIDED PORT INSERTION  02/11/2018  . TONSILLECTOMY       SOCIAL HISTORY:  Social History   Socioeconomic History  .  Marital status: Married    Spouse name: Not on file  . Number of children: 2  . Years of education: Not on file  . Highest education level: Not on file  Occupational History  . Occupation: Full time Dealer    Comment: unemployed  Social Needs  . Financial resource strain: Not very hard  . Food insecurity:    Worry: Never true    Inability: Never true  . Transportation needs:    Medical: No    Non-medical: No    Tobacco Use  . Smoking status: Current Every Day Smoker    Packs/day: 1.50    Years: 41.00    Pack years: 61.50    Types: Cigarettes  . Smokeless tobacco: Never Used  Substance and Sexual Activity  . Alcohol use: Never    Frequency: Never  . Drug use: Never  . Sexual activity: Not Currently  Lifestyle  . Physical activity:    Days per week: 0 days    Minutes per session: 0 min  . Stress: To some extent  Relationships  . Social connections:    Talks on phone: More than three times a week    Gets together: More than three times a week    Attends religious service: Never    Active member of club or organization: No    Attends meetings of clubs or organizations: Never    Relationship status: Married  . Intimate partner violence:    Fear of current or ex partner: No    Emotionally abused: No    Physically abused: No    Forced sexual activity: No  Other Topics Concern  . Not on file  Social History Narrative   Married    FAMILY HISTORY:  Family History  Problem Relation Age of Onset  . Diabetes Mother   . Hypertension Mother   . Lung cancer Father     CURRENT MEDICATIONS:  Outpatient Encounter Medications as of 03/31/2018  Medication Sig  . amLODipine (NORVASC) 10 MG tablet Take 10 mg by mouth daily.   Marland Kitchen aspirin EC 325 MG tablet Take 325 mg by mouth daily.   Marland Kitchen CARBOPLATIN IV Inject into the vein.  Marland Kitchen dexamethasone (DECADRON) 4 MG tablet Take 1 tablet (4 mg total) by mouth daily.  Marland Kitchen docusate sodium (COLACE) 100 MG capsule Take 100 mg by mouth daily.   Marland Kitchen dronabinol (MARINOL) 2.5 MG capsule Take 1 capsule (2.5 mg total) by mouth 2 (two) times daily before a meal.  . folic acid (FOLVITE) 1 MG tablet Take 1 tablet (1 mg total) by mouth daily.  . hydrochlorothiazide (HYDRODIURIL) 25 MG tablet Take 25 mg by mouth daily.   Marland Kitchen ibuprofen (ADVIL,MOTRIN) 200 MG tablet Take 600 mg by mouth every 6 (six) hours as needed for headache or moderate pain.   Marland Kitchen lidocaine-prilocaine (EMLA)  cream Apply small amount over port site and cover with plastic wrap one hour prior to appointment.  Marland Kitchen lisinopril (PRINIVIL,ZESTRIL) 20 MG tablet Take 20 mg by mouth daily.  . memantine (NAMENDA) 5 MG tablet TAKE 1 TABLET IN THE MORNING FOR 7 DAYS, 1 TABLET IN THE MORNING & 1 TABLET IN THE EVENING FOR 7 DAYS, 2 TABS IN THE MORNING & 1 TABLET IN T  . morphine (MSIR) 30 MG tablet Take 1 tablet (30 mg total) by mouth every 6 (six) hours as needed for severe pain.  Marland Kitchen ondansetron (ZOFRAN ODT) 4 MG disintegrating tablet Take 1 tablet (4 mg total) by mouth every  8 (eight) hours as needed for nausea or vomiting.  . ondansetron (ZOFRAN) 4 MG tablet Take 1 tablet (4 mg total) by mouth every 8 (eight) hours as needed for nausea or vomiting.  . ondansetron (ZOFRAN-ODT) 4 MG disintegrating tablet TAKE 1 TABLET BY MOUTH EVERY 8 HOURS AS NEEDED FOR NAUSEA OR VOMITING  . PEMBROLIZUMAB IV Inject into the vein.  Marland Kitchen PEMEtrexed 500 mg/m2 in sodium chloride 0.9 % 100 mL Inject 500 mg/m2 into the vein once.  . prochlorperazine (COMPAZINE) 10 MG tablet Take 1 tablet (10 mg total) by mouth every 6 (six) hours as needed for nausea or vomiting.  Marland Kitchen scopolamine (TRANSDERM-SCOP) 1 MG/3DAYS Place 1 patch (1.5 mg total) onto the skin every 3 (three) days.  . simvastatin (ZOCOR) 20 MG tablet Take 20 mg by mouth daily.    No facility-administered encounter medications on file as of 03/31/2018.     ALLERGIES:  Allergies  Allergen Reactions  . Hydrocodone Hives     PHYSICAL EXAM:  ECOG Performance status: 1  VITAL SIGNS: 101/77, P:125, R:18, T:97.7, O2:99% HALPFXT:024.0  Physical Exam Constitutional:      Appearance: Normal appearance. He is normal weight.  Cardiovascular:     Rate and Rhythm: Normal rate and regular rhythm.     Heart sounds: Normal heart sounds.  Pulmonary:     Effort: Pulmonary effort is normal.     Breath sounds: Normal breath sounds.  Musculoskeletal: Normal range of motion.  Skin:    General:  Skin is warm and dry.  Neurological:     Mental Status: He is alert and oriented to person, place, and time. Mental status is at baseline.  Psychiatric:        Mood and Affect: Mood normal.        Behavior: Behavior normal.        Thought Content: Thought content normal.        Judgment: Judgment normal.   Trace edema bilaterally.   LABORATORY DATA:  I have reviewed the labs as listed.  CBC    Component Value Date/Time   WBC 15.7 (H) 03/31/2018 1042   RBC 3.77 (L) 03/31/2018 1042   HGB 10.4 (L) 03/31/2018 1042   HCT 33.0 (L) 03/31/2018 1042   PLT 305 03/31/2018 1042   MCV 87.5 03/31/2018 1042   MCH 27.6 03/31/2018 1042   MCHC 31.5 03/31/2018 1042   RDW 22.1 (H) 03/31/2018 1042   LYMPHSABS 0.9 03/31/2018 1042   MONOABS 1.1 (H) 03/31/2018 1042   EOSABS 0.0 03/31/2018 1042   BASOSABS 0.0 03/31/2018 1042   CMP Latest Ref Rng & Units 03/31/2018 03/22/2018 03/16/2018  Glucose 70 - 99 mg/dL 133(H) 141(H) 121(H)  BUN 6 - 20 mg/dL 13 12 17   Creatinine 0.61 - 1.24 mg/dL 0.82 0.87 0.76  Sodium 135 - 145 mmol/L 130(L) 135 131(L)  Potassium 3.5 - 5.1 mmol/L 3.7 3.9 3.1(L)  Chloride 98 - 111 mmol/L 96(L) 105 99  CO2 22 - 32 mmol/L 22 20(L) 20(L)  Calcium 8.9 - 10.3 mg/dL 8.3(L) 8.8(L) 8.4(L)  Total Protein 6.5 - 8.1 g/dL 8.0 8.2(H) 8.5(H)  Total Bilirubin 0.3 - 1.2 mg/dL 1.9(H) 0.6 2.7(H)  Alkaline Phos 38 - 126 U/L 219(H) 195(H) 228(H)  AST 15 - 41 U/L 16 17 28   ALT 0 - 44 U/L 11 19 37       DIAGNOSTIC IMAGING:  I have independently reviewed the scans and discussed with the patient.   I have reviewed Francene Finders, NP's  note and agree with the documentation.  I personally performed a face-to-face visit, made revisions and my assessment and plan is as follows.    ASSESSMENT & PLAN:   Metastatic lung cancer (metastasis from lung to other site), left ([place]) 1.  Metastatic adenocarcinoma of the lung, with brain mets: -PDL 1 TPS of 0%, foundation 1 without any actionable  mutations. - Presentation to the ER on 01/22/2018 with right shoulder pain of one-month duration. - 60-pack-year smoker. -CT scan of the shoulder shows large destructive lytic lesion involving the glenoid and the coracoid. - CT CAP on 01/28/2018 shows 5.1 x 3.6 cm left lower lobe mass, left hilar and mediastinal lymphadenopathy, 13 mm low-attenuation lesion in the liver, large destructive bone lesion involving the right scapula, no evidence of other bone lesions, bilateral iliac and inguinal lymphadenopathy. - We discussed the results of the biopsy of the right scapular lesion dated 02/08/2018 which was consistent with metastatic adenocarcinoma.  CK7 was positive.  CDX 2 was positive and a subset of tumor cells.  CD20, TTF-1, Napsin-A, p63, CK 5/6, are negative.  This was consistent with enteric differentiation of lung cancer. -MRI of the brain on 02/03/2018 showed more than 20 metastasis, measuring up to 7 mm throughout the brain.  There is mild edema associated with the larger lesions.  No mass-effect.  - He received radiation therapy to the whole brain and right scapula from 02/14/2018 through 03/01/2018.  -He had developed intractable nausea after radiation. - Cycle 1 of carboplatin and pemetrexed on 02/26/2018. - He went to the ER on 03/08/2018 and 03/16/2018 with dehydration and weakness.  He was given some fluids. - He lost 15 pounds from cycle 1. -We started him on Marinol a week ago.  He is taking 2.5 mg twice daily.  His appetite has picked up according to his family members. -I have increased Marinol to 5 mg twice a day.  He lost 2 pounds in the last 1 week.  He has 1+ edema in the legs.  His albumin is low at 2.4.  His bilirubin is slightly high at 1.9. -We will proceed with cycle 2 today.  I will give a flat dose of carboplatin of 500 mg today. -We will also introduce pembrolizumab.  We talked about the immunotherapy related side effects in detail. -We will arrange him for IV fluids next  Monday.  I will see him back in 3 weeks for follow-up.  2.  Right shoulder pain: - He received palliative radiation.  He is not requiring morphine 30 mg every 6 hours on a consistent basis.  3.  Intractable nausea and vomiting: - He is currently using Zofran as needed at home.  He is also on Decadron 4 mg in the mornings. -Scopolamine patch is also helping. -Nausea is very well controlled with this regimen.  He gets nauseous only after traveling in a car.        Orders placed this encounter:  Orders Placed This Encounter  Procedures  . TSH  . CBC with Differential/Platelet  . Comprehensive metabolic panel  . CBC with Differential/Platelet  . Comprehensive metabolic panel      Derek Jack, MD [place] [phone]

## 2018-03-31 NOTE — Progress Notes (Signed)
1210 lab work reviewed, including bili 1.7, and patient seen by Dr. Delton Coombes. Per MD, we will proceed with Alimta and Carboplatin today with the addition of Keytruda.  Ian Roberts tolerated Carbo/Pem/Pem without incident or complaint. VSS upon completion of treatment. Discharged via wheelcair in satisfactory condition in presence of wife.

## 2018-04-05 ENCOUNTER — Ambulatory Visit (HOSPITAL_COMMUNITY): Payer: Self-pay

## 2018-04-05 ENCOUNTER — Other Ambulatory Visit (HOSPITAL_COMMUNITY): Payer: Self-pay

## 2018-04-06 ENCOUNTER — Other Ambulatory Visit (HOSPITAL_COMMUNITY): Payer: Self-pay

## 2018-04-06 ENCOUNTER — Ambulatory Visit (HOSPITAL_COMMUNITY): Payer: Self-pay

## 2018-04-07 ENCOUNTER — Emergency Department (HOSPITAL_COMMUNITY)
Admission: EM | Admit: 2018-04-07 | Discharge: 2018-04-07 | Disposition: A | Discharge status: Home / Self Care | Payer: Self-pay | Attending: Emergency Medicine | Admitting: Emergency Medicine

## 2018-04-07 ENCOUNTER — Encounter (HOSPITAL_COMMUNITY): Payer: Self-pay

## 2018-04-07 ENCOUNTER — Other Ambulatory Visit: Payer: Self-pay

## 2018-04-07 DIAGNOSIS — K219 Gastro-esophageal reflux disease without esophagitis: Secondary | ICD-10-CM | POA: Insufficient documentation

## 2018-04-07 DIAGNOSIS — C3490 Malignant neoplasm of unspecified part of unspecified bronchus or lung: Secondary | ICD-10-CM | POA: Insufficient documentation

## 2018-04-07 DIAGNOSIS — R112 Nausea with vomiting, unspecified: Secondary | ICD-10-CM | POA: Insufficient documentation

## 2018-04-07 DIAGNOSIS — I10 Essential (primary) hypertension: Secondary | ICD-10-CM | POA: Insufficient documentation

## 2018-04-07 DIAGNOSIS — I2510 Atherosclerotic heart disease of native coronary artery without angina pectoris: Secondary | ICD-10-CM | POA: Insufficient documentation

## 2018-04-07 DIAGNOSIS — I252 Old myocardial infarction: Secondary | ICD-10-CM | POA: Insufficient documentation

## 2018-04-07 DIAGNOSIS — Z955 Presence of coronary angioplasty implant and graft: Secondary | ICD-10-CM | POA: Insufficient documentation

## 2018-04-07 DIAGNOSIS — R531 Weakness: Secondary | ICD-10-CM

## 2018-04-07 DIAGNOSIS — F17210 Nicotine dependence, cigarettes, uncomplicated: Secondary | ICD-10-CM | POA: Insufficient documentation

## 2018-04-07 DIAGNOSIS — Z7982 Long term (current) use of aspirin: Secondary | ICD-10-CM | POA: Insufficient documentation

## 2018-04-07 DIAGNOSIS — E876 Hypokalemia: Secondary | ICD-10-CM | POA: Insufficient documentation

## 2018-04-07 DIAGNOSIS — E860 Dehydration: Secondary | ICD-10-CM | POA: Insufficient documentation

## 2018-04-07 DIAGNOSIS — Z79899 Other long term (current) drug therapy: Secondary | ICD-10-CM | POA: Insufficient documentation

## 2018-04-07 LAB — COMPREHENSIVE METABOLIC PANEL
ALT: 17 U/L (ref 0–44)
AST: 21 U/L (ref 15–41)
Albumin: 2.7 g/dL — ABNORMAL LOW (ref 3.5–5.0)
Alkaline Phosphatase: 229 U/L — ABNORMAL HIGH (ref 38–126)
Anion gap: 12 (ref 5–15)
BUN: 20 mg/dL (ref 6–20)
CO2: 21 mmol/L — ABNORMAL LOW (ref 22–32)
Calcium: 8.3 mg/dL — ABNORMAL LOW (ref 8.9–10.3)
Chloride: 97 mmol/L — ABNORMAL LOW (ref 98–111)
Creatinine, Ser: 0.57 mg/dL — ABNORMAL LOW (ref 0.61–1.24)
GFR calc Af Amer: >60 mL/min (ref >60)
GFR calc non Af Amer: >60 mL/min (ref >60)
Glucose, Bld: 130 mg/dL — ABNORMAL HIGH (ref 70–99)
POTASSIUM: 3.2 mmol/L — AB (ref 3.5–5.1)
Sodium: 130 mmol/L — ABNORMAL LOW (ref 135–145)
Total Bilirubin: 3 mg/dL — ABNORMAL HIGH (ref 0.3–1.2)
Total Protein: 8.3 g/dL — ABNORMAL HIGH (ref 6.5–8.1)

## 2018-04-07 LAB — CBC WITH DIFFERENTIAL/PLATELET
Abs Immature Granulocytes: 0.07 10*3/uL (ref 0.00–0.07)
Basophils Absolute: 0 10*3/uL (ref 0.0–0.1)
Basophils Relative: 0 %
Eosinophils Absolute: 0 10*3/uL (ref 0.0–0.5)
Eosinophils Relative: 0 %
HCT: 30.5 % — ABNORMAL LOW (ref 39.0–52.0)
HEMOGLOBIN: 9.8 g/dL — AB (ref 13.0–17.0)
Immature Granulocytes: 2 %
Lymphocytes Relative: 8 %
Lymphs Abs: 0.3 10*3/uL — ABNORMAL LOW (ref 0.7–4.0)
MCH: 27.4 pg (ref 26.0–34.0)
MCHC: 32.1 g/dL (ref 30.0–36.0)
MCV: 85.2 fL (ref 80.0–100.0)
Monocytes Absolute: 0 10*3/uL — ABNORMAL LOW (ref 0.1–1.0)
Monocytes Relative: 0 %
NEUTROS PCT: 90 %
Neutro Abs: 3 10*3/uL (ref 1.7–7.7)
Platelets: 123 10*3/uL — ABNORMAL LOW (ref 150–400)
RBC: 3.58 MIL/uL — ABNORMAL LOW (ref 4.22–5.81)
RDW: 20.3 % — ABNORMAL HIGH (ref 11.5–15.5)
WBC: 3.3 10*3/uL — ABNORMAL LOW (ref 4.0–10.5)
nRBC: 0 % (ref 0.0–0.2)

## 2018-04-07 LAB — LACTIC ACID, PLASMA: Lactic Acid, Venous: 1.9 mmol/L (ref 0.5–1.9)

## 2018-04-07 MED ORDER — ALUM & MAG HYDROXIDE-SIMETH 200-200-20 MG/5ML PO SUSP
30.0000 mL | Freq: Once | ORAL | Status: AC
  Administered 2018-04-07: 30 mL via ORAL
  Filled 2018-04-07: qty 30

## 2018-04-07 MED ORDER — ONDANSETRON HCL 4 MG/2ML IJ SOLN
4.0000 mg | Freq: Once | INTRAMUSCULAR | Status: AC
  Administered 2018-04-07: 4 mg via INTRAVENOUS
  Filled 2018-04-07: qty 2

## 2018-04-07 MED ORDER — SODIUM CHLORIDE 0.9 % IV BOLUS
1000.0000 mL | Freq: Once | INTRAVENOUS | Status: AC
  Administered 2018-04-07: 1000 mL via INTRAVENOUS

## 2018-04-07 MED ORDER — OMEPRAZOLE 20 MG PO CPDR: DELAYED_RELEASE_CAPSULE | ORAL | 0 refills | Status: DC

## 2018-04-07 MED ORDER — FAMOTIDINE IN NACL 20-0.9 MG/50ML-% IV SOLN
20.0000 mg | Freq: Once | INTRAVENOUS | Status: AC
  Administered 2018-04-07: 20 mg via INTRAVENOUS
  Filled 2018-04-07: qty 50

## 2018-04-07 MED ORDER — METOCLOPRAMIDE HCL 5 MG/ML IJ SOLN
10.0000 mg | Freq: Once | INTRAMUSCULAR | Status: AC
  Administered 2018-04-07: 10 mg via INTRAVENOUS
  Filled 2018-04-07: qty 2

## 2018-04-07 MED ORDER — POTASSIUM CHLORIDE CRYS ER 20 MEQ PO TBCR
40.0000 meq | EXTENDED_RELEASE_TABLET | Freq: Once | ORAL | Status: AC
  Administered 2018-04-07: 40 meq via ORAL
  Filled 2018-04-07: qty 2

## 2018-04-07 MED ORDER — POTASSIUM CHLORIDE CRYS ER 20 MEQ PO TBCR: 20.0000 meq | EXTENDED_RELEASE_TABLET | Freq: Two times a day (BID) | ORAL | 0 refills | Status: DC

## 2018-04-07 NOTE — ED Notes (Signed)
Pt urine specimen sent to lab

## 2018-04-07 NOTE — ED Triage Notes (Signed)
EMS called out for generalized weakness. CBG was 159 per EMS. Pt has hx of lung CA with mets to brain and shoulder.  Pt was unable to make it to appt here today at the [facility] center for IV fluids. Port noted to left chest. Pt c/o right shoulder pain, which is where CA mass is located.

## 2018-04-07 NOTE — ED Provider Notes (Signed)
[facility] EMERGENCY DEPARTMENT Provider Note   CSN: [id number] Arrival date & time: 04/07/18  0022  Time seen 1:30 AM   History   Chief Complaint Chief Complaint  Patient presents with  . Weakness    generalized    HPI Ian Roberts is a 58 y.o. male.  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  He had radiation therapy starting on November 25 through November 10 of his whole brain and the right scapula.    He had his first chemotherapy on December 11 with carboplatin, pembrolizumab  and pemetrexed.  His second round of chemotherapy was on January 9.  He was scheduled to get IV fluids weekly to help prevent dehydration.  He was scheduled to get IV fluids on the 14th however he was too weak to go up or down the steps at his house to go to get his IV fluids.  Wife reports he has had marked decrease of his oral fluid intake.  He states he has nausea he states he vomited twice today which the wife is unaware of.  They deny any fever.  He denies any diarrhea.  She states because he is sleeping in a recliner he has an area in his groin that is draining.  PCP Neale Burly, MD Oncology Dr. Delton Coombes  Past Medical History:  Diagnosis Date  . Bone lesion    right shoulder found on x-ray  . CAD (coronary artery disease)   . Dyslipidemia   . Hypertension   . Hypokalemia   . Necrotizing fasciitis ([place])   . Rotator cuff tear   . ST elevation myocardial infarction (STEMI) of inferior wall (HCC)    Assocaited with paroxysmal atrial fibrillation 05/13/2008; Xience drug eluting stent  . Stroke [facility])     Patient Active Problem List   Diagnosis Date Noted  . Goals of care, counseling/discussion 02/22/2018  . Metastatic lung cancer (metastasis from lung to other site), left ([place]) 01/31/2018  . DYSLIPIDEMIA 06/05/2008  . HYPERTENSION, BENIGN ESSENTIAL 06/05/2008  .  ACUT MI OTH INF WALL SUBSQT EPIS CARE 06/05/2008  . CORONARY ATHEROSCLEROSIS NATIVE CORONARY ARTERY 06/05/2008    Past Surgical History:  Procedure Laterality Date  . CORONARY ANGIOPLASTY WITH STENT PLACEMENT    . CYST EXCISION    . IR IMAGING GUIDED PORT INSERTION  02/11/2018  . TONSILLECTOMY          Home Medications    Prior to Admission medications   Medication Sig Start Date End Date Taking? Authorizing Provider  amLODipine (NORVASC) 10 MG tablet Take 10 mg by mouth daily.     [provider]  aspirin EC 325 MG tablet Take 325 mg by mouth daily.     [provider]  CARBOPLATIN IV Inject into the vein.    [provider]  dexamethasone (DECADRON) 4 MG tablet Take 1 tablet (4 mg total) by mouth daily. 02/22/18   Lockamy, Randi L, NP-C  docusate sodium (COLACE) 100 MG capsule Take 100 mg by mouth daily.     [provider]  dronabinol (MARINOL) 2.5 MG capsule Take 1 capsule (2.5 mg total) by mouth 2 (two) times daily before a meal. 03/21/18   Lockamy, Randi L, NP-C  folic acid (FOLVITE) 1 MG tablet Take 1 tablet (1 mg total) by mouth daily. 02/22/18   Lockamy, Randi L, NP-C  hydrochlorothiazide (  HYDRODIURIL) 25 MG tablet Take 25 mg by mouth daily.     [provider]  ibuprofen (ADVIL,MOTRIN) 200 MG tablet Take 600 mg by mouth every 6 (six) hours as needed for headache or moderate pain.  01/21/18   [provider]  lidocaine-prilocaine (EMLA) cream Apply small amount over port site and cover with plastic wrap one hour prior to appointment. 02/24/18   Derek Jack, MD  lisinopril (PRINIVIL,ZESTRIL) 20 MG tablet Take 20 mg by mouth daily.    [provider]  memantine (NAMENDA) 5 MG tablet TAKE 1 TABLET IN THE MORNING FOR 7 DAYS, 1 TABLET IN THE MORNING & 1 TABLET IN THE EVENING FOR 7 DAYS, 2 TABS IN THE MORNING & 1 TABLET IN T 02/05/18   [provider]  morphine (MSIR) 30 MG tablet Take 1 tablet (30 mg  total) by mouth every 6 (six) hours as needed for severe pain. 03/21/18   Glennie Isle, NP-C  omeprazole (PRILOSEC) 20 MG capsule Take 1 po BID x 2 weeks then once a day 04/07/18   Rolland Porter, MD  ondansetron (ZOFRAN ODT) 4 MG disintegrating tablet Take 1 tablet (4 mg total) by mouth every 8 (eight) hours as needed for nausea or vomiting. 03/18/18   Derek Jack, MD  ondansetron (ZOFRAN) 4 MG tablet Take 1 tablet (4 mg total) by mouth every 8 (eight) hours as needed for nausea or vomiting. 03/16/18   Rolland Porter, MD  ondansetron (ZOFRAN-ODT) 4 MG disintegrating tablet TAKE 1 TABLET BY MOUTH EVERY 8 HOURS AS NEEDED FOR NAUSEA OR VOMITING 03/18/18   Lockamy, Randi L, NP-C  PEMBROLIZUMAB IV Inject into the vein.    [provider]  PEMEtrexed 500 mg/m2 in sodium chloride 0.9 % 100 mL Inject 500 mg/m2 into the vein once.    [provider]  potassium chloride SA (K-DUR,KLOR-CON) 20 MEQ tablet Take 1 tablet (20 mEq total) by mouth 2 (two) times daily. 04/07/18   Rolland Porter, MD  prochlorperazine (COMPAZINE) 10 MG tablet Take 1 tablet (10 mg total) by mouth every 6 (six) hours as needed for nausea or vomiting. 01/31/18   Derek Jack, MD  scopolamine (TRANSDERM-SCOP) 1 MG/3DAYS Place 1 patch (1.5 mg total) onto the skin every 3 (three) days. 02/24/18   Derek Jack, MD  simvastatin (ZOCOR) 20 MG tablet Take 20 mg by mouth daily.     [provider]    Family History Family History  Problem Relation Age of Onset  . Diabetes Mother   . Hypertension Mother   . Lung cancer Father     Social History Social History   Tobacco Use  . Smoking status: Current Every Day Smoker    Packs/day: 1.50    Years: 41.00    Pack years: 61.50    Types: Cigarettes  . Smokeless tobacco: Never Used  Substance Use Topics  . Alcohol use: Never    Frequency: Never  . Drug use: Never  Lives at home Lives with spouse   Allergies   Hydrocodone   Review of  Systems Review of Systems  All other systems reviewed and are negative.    Physical Exam Updated Vital Signs BP 105/70   Pulse (!) 103   Temp 97.7 F (36.5 C) (Oral)   Resp 18   Ht 5\' 11"  (1.803 m)   Wt 71.2 kg   SpO2 100%   BMI 21.90 kg/m   Vital signs normal except tachycardia   Physical Exam Vitals signs  and nursing note reviewed.  Constitutional:      Appearance: He is well-developed. He is ill-appearing. He is not toxic-appearing.     Comments: Patient appears younger than when I saw him last, he seems very slow and looks like he feels bad  HENT:     Head: Normocephalic and atraumatic.     Right Ear: External ear normal.     Left Ear: External ear normal.     Nose: Nose normal. No mucosal edema or rhinorrhea.     Mouth/Throat:     Mouth: Mucous membranes are dry.     Dentition: No dental abscesses.     Pharynx: No uvula swelling.  Eyes:     Conjunctiva/sclera: Conjunctivae normal.     Pupils: Pupils are equal, round, and reactive to light.  Neck:     Musculoskeletal: Full passive range of motion without pain, normal range of motion and neck supple.  Cardiovascular:     Rate and Rhythm: Normal rate and regular rhythm.     Heart sounds: Normal heart sounds. No murmur. No friction rub. No gallop.   Pulmonary:     Effort: Pulmonary effort is normal. No respiratory distress.     Breath sounds: Normal breath sounds. No wheezing, rhonchi or rales.  Chest:     Chest wall: No tenderness or crepitus.  Abdominal:     General: Bowel sounds are normal. There is no distension.     Palpations: Abdomen is soft.     Tenderness: There is no abdominal tenderness. There is no guarding or rebound.  Genitourinary:    Comments: When wife lifts up his scrotum there is a small slit in the skin on the right side and there is bloodstained drainage on his underwear in that area.  There is no redness or swelling seen.  The opening is about half a centimeter in size.  There is no other  lesions seen. Musculoskeletal: Normal range of motion.        General: No tenderness.     Comments: Moves all extremities well.   Skin:    General: Skin is warm and dry.     Coloration: Skin is pale.     Findings: No erythema or rash.  Neurological:     General: No focal deficit present.     Mental Status: He is oriented to person, place, and time.     Cranial Nerves: No cranial nerve deficit.  Psychiatric:        Mood and Affect: Affect is flat.        Speech: Speech is delayed.        Behavior: Behavior normal. Behavior is cooperative.      ED Treatments / Results  Labs (all labs ordered are listed, but only abnormal results are displayed) Results for orders placed or performed during the hospital encounter of 04/07/18  Lactic acid, plasma  Result Value Ref Range   Lactic Acid, Venous 1.9 0.5 - 1.9 mmol/L  CBC with Differential  Result Value Ref Range   WBC 3.3 (L) 4.0 - 10.5 K/uL   RBC 3.58 (L) 4.22 - 5.81 MIL/uL   Hemoglobin 9.8 (L) 13.0 - 17.0 g/dL   HCT 30.5 (L) 39.0 - 52.0 %   MCV 85.2 80.0 - 100.0 fL   MCH 27.4 26.0 - 34.0 pg   MCHC 32.1 30.0 - 36.0 g/dL   RDW 20.3 (H) 11.5 - 15.5 %   Platelets 123 (L) 150 - 400 K/uL   nRBC  0.0 0.0 - 0.2 %   Neutrophils Relative % 90 %   Neutro Abs 3.0 1.7 - 7.7 K/uL   Lymphocytes Relative 8 %   Lymphs Abs 0.3 (L) 0.7 - 4.0 K/uL   Monocytes Relative 0 %   Monocytes Absolute 0.0 (L) 0.1 - 1.0 K/uL   Eosinophils Relative 0 %   Eosinophils Absolute 0.0 0.0 - 0.5 K/uL   Basophils Relative 0 %   Basophils Absolute 0.0 0.0 - 0.1 K/uL   Immature Granulocytes 2 %   Abs Immature Granulocytes 0.07 0.00 - 0.07 K/uL  Comprehensive metabolic panel  Result Value Ref Range   Sodium 130 (L) 135 - 145 mmol/L   Potassium 3.2 (L) 3.5 - 5.1 mmol/L   Chloride 97 (L) 98 - 111 mmol/L   CO2 21 (L) 22 - 32 mmol/L   Glucose, Bld 130 (H) 70 - 99 mg/dL   BUN 20 6 - 20 mg/dL   Creatinine, Ser 0.57 (L) 0.61 - 1.24 mg/dL   Calcium 8.3 (L) 8.9 -  10.3 mg/dL   Total Protein 8.3 (H) 6.5 - 8.1 g/dL   Albumin 2.7 (L) 3.5 - 5.0 g/dL   AST 21 15 - 41 U/L   ALT 17 0 - 44 U/L   Alkaline Phosphatase 229 (H) 38 - 126 U/L   Total Bilirubin 3.0 (H) 0.3 - 1.2 mg/dL   GFR calc non Af Amer >60 >60 mL/min   GFR calc Af Amer >60 >60 mL/min   Anion gap 12 5 - 15   Laboratory interpretation all normal except low total white blood cell count without neutropenia, anemia, some minor elevation of LFTs, CO2 borderline low, without metabolic acidosis, mild hypokalemia.  He has a mild anemia, his last blood work in December his hemoglobin was 10.7 and 10.5, 11.4, 12.3 and in November was 11.6.    EKG None  Radiology No results found.  Procedures Procedures (including critical care time)  Medications Ordered in ED Medications  sodium chloride 0.9 % bolus 1,000 mL (1,000 mLs Intravenous New Bag/Given 04/07/18 0552)  sodium chloride 0.9 % bolus 1,000 mL (0 mLs Intravenous Stopped 04/07/18 0435)  sodium chloride 0.9 % bolus 1,000 mL (0 mLs Intravenous Stopped 04/07/18 0435)  ondansetron (ZOFRAN) injection 4 mg (4 mg Intravenous Given 04/07/18 0159)  potassium chloride SA (K-DUR,KLOR-CON) CR tablet 40 mEq (40 mEq Oral Given 04/07/18 0332)  ondansetron (ZOFRAN) injection 4 mg (4 mg Intravenous Given 04/07/18 0441)  sodium chloride 0.9 % bolus 1,000 mL (0 mLs Intravenous Stopped 04/07/18 0542)  metoCLOPramide (REGLAN) injection 10 mg (10 mg Intravenous Given 04/07/18 0552)  famotidine (PEPCID) IVPB 20 mg premix (20 mg Intravenous New Bag/Given 04/07/18 0553)  alum & mag hydroxide-simeth (MAALOX/MYLANTA) 200-200-20 MG/5ML suspension 30 mL (30 mLs Oral Given 04/07/18 0553)     Initial Impression / Assessment and Plan / ED Course  I have reviewed the triage vital signs and the nursing notes.  Pertinent labs & imaging results that were available during my care of the patient were reviewed by me and considered in my medical decision making (see chart for  details).     Patient was given IV fluids and IV nausea medication.  He was given his oral pain medication he takes at home.  Recheck at 3 AM patient has received about half of his 2 L bolus that was ordered.  Discussed his test results and the need for the oral potassium.  Wife was advised when he has gotten his  IV fluids to let us know so we can see if he needs more IV fluids or if he is feeling better and can go home.  Recheck at 4:15 AM patient has finished his 2 L of IV fluids.  He states he does not need to urinate right now but he "could if he tried".  He does not want to do any oral fluid challenge.  He was given #3 liter of fluids and he also requested more nausea medicine.  5:30 AM nurse reports patient has had a small amount of vomiting, he is complained of nausea and now is having heartburn.  He was given a # 4 liter of normal saline, Reglan for nausea, IV Pepcid and oral Maalox for his complaints of heartburn.  Recheck at 6:10 AM patient is sitting up in the stretcher.  He has had about 500 cc of amber urine consistent with his elevated total bilirubin.  He states his nausea is better.  He has just starting his fourth liter of fluids, we discussed with him that his and he can go home and he is agreeable.  He states his heartburn is also better.  Wife states he takes over-the-counter famotidine, we discussed upping his GERD medication to omeprazole.  Wife states they have plenty of nausea medication at home.  Final Clinical Impressions(s) / ED Diagnoses   Final diagnoses:  Generalized weakness  Dehydration  Nausea and vomiting, intractability of vomiting not specified, unspecified vomiting type  Hypokalemia  Gastroesophageal reflux disease without esophagitis    ED Discharge Orders         Ordered    omeprazole (PRILOSEC) 20 MG capsule     04/07/18 0621    potassium chloride SA (K-DUR,KLOR-CON) 20 MEQ tablet  2 times daily     04/07/18 0622          Plan  discharge  Rolland Porter, MD, Barbette Or, MD 04/07/18 [phone]

## 2018-04-07 NOTE — Discharge Instructions (Signed)
Try to drink more fluids, try Gatorade or Pedialyte.  Continue your nausea medication.  Start taking the omeprazole for your stomach acid problems.  Let Dr. Delton Coombes know about your ED visit today.

## 2018-04-07 NOTE — ED Notes (Signed)
Pt called out stating he is nauseous, Dr. Tomi Bamberger informed

## 2018-04-07 NOTE — ED Notes (Signed)
Pt requesting something for pain, Dr. Tomi Bamberger made aware and states pt can take his own home meds; pt informed and cup of water given

## 2018-04-14 ENCOUNTER — Encounter (HOSPITAL_COMMUNITY): Payer: Self-pay | Admitting: *Deleted

## 2018-04-14 ENCOUNTER — Other Ambulatory Visit (HOSPITAL_COMMUNITY): Payer: Self-pay | Admitting: Nurse Practitioner

## 2018-04-14 ENCOUNTER — Ambulatory Visit (HOSPITAL_COMMUNITY): Payer: Self-pay

## 2018-04-14 ENCOUNTER — Other Ambulatory Visit (HOSPITAL_COMMUNITY): Payer: Self-pay

## 2018-04-14 ENCOUNTER — Ambulatory Visit (HOSPITAL_COMMUNITY): Payer: Self-pay | Admitting: Hematology

## 2018-04-14 DIAGNOSIS — C3492 Malignant neoplasm of unspecified part of left bronchus or lung: Secondary | ICD-10-CM

## 2018-04-14 NOTE — Progress Notes (Signed)
Patient's daughter, Ian Roberts, called clinic stating that Ian Roberts is still not eating well.  He is able to drink some fluids, Ensure doesn't go down well.  She states that his taste has improved but now he is just not wanting to eat. He is continuing to loose weight.  She wants to know if he is a candidate for feeding tube.  I have notified Francene Finders, NP who placed orders.

## 2018-04-19 ENCOUNTER — Other Ambulatory Visit: Payer: Self-pay | Admitting: Radiology

## 2018-04-19 ENCOUNTER — Other Ambulatory Visit: Payer: Self-pay | Admitting: Physician Assistant

## 2018-04-20 ENCOUNTER — Other Ambulatory Visit (HOSPITAL_COMMUNITY): Payer: Self-pay | Admitting: Nurse Practitioner

## 2018-04-20 ENCOUNTER — Inpatient Hospital Stay (HOSPITAL_COMMUNITY)
Admission: EM | Admit: 2018-04-20 | Discharge: 2018-05-01 | DRG: 640 | Disposition: A | Discharge status: Hospice | Payer: Medicaid Other | Attending: Internal Medicine | Admitting: Internal Medicine

## 2018-04-20 ENCOUNTER — Other Ambulatory Visit: Payer: Self-pay

## 2018-04-20 ENCOUNTER — Other Ambulatory Visit (HOSPITAL_COMMUNITY): Payer: Self-pay

## 2018-04-20 ENCOUNTER — Encounter (HOSPITAL_COMMUNITY): Payer: Self-pay

## 2018-04-20 ENCOUNTER — Ambulatory Visit (HOSPITAL_COMMUNITY)
Admission: RE | Admit: 2018-04-20 | Discharge: 2018-04-20 | Disposition: A | Discharge status: Home / Self Care | Payer: Medicaid Other | Source: Ambulatory Visit | Attending: Nurse Practitioner | Admitting: Nurse Practitioner

## 2018-04-20 DIAGNOSIS — K56609 Unspecified intestinal obstruction, unspecified as to partial versus complete obstruction: Secondary | ICD-10-CM

## 2018-04-20 DIAGNOSIS — C3492 Malignant neoplasm of unspecified part of left bronchus or lung: Secondary | ICD-10-CM | POA: Insufficient documentation

## 2018-04-20 DIAGNOSIS — C787 Secondary malignant neoplasm of liver and intrahepatic bile duct: Secondary | ICD-10-CM | POA: Diagnosis present

## 2018-04-20 DIAGNOSIS — G894 Chronic pain syndrome: Secondary | ICD-10-CM | POA: Diagnosis present

## 2018-04-20 DIAGNOSIS — R531 Weakness: Secondary | ICD-10-CM

## 2018-04-20 DIAGNOSIS — R54 Age-related physical debility: Secondary | ICD-10-CM | POA: Diagnosis present

## 2018-04-20 DIAGNOSIS — I480 Paroxysmal atrial fibrillation: Secondary | ICD-10-CM | POA: Diagnosis present

## 2018-04-20 DIAGNOSIS — Z885 Allergy status to narcotic agent status: Secondary | ICD-10-CM

## 2018-04-20 DIAGNOSIS — Z538 Procedure and treatment not carried out for other reasons: Secondary | ICD-10-CM | POA: Insufficient documentation

## 2018-04-20 DIAGNOSIS — L8990 Pressure ulcer of unspecified site, unspecified stage: Secondary | ICD-10-CM | POA: Diagnosis present

## 2018-04-20 DIAGNOSIS — C3490 Malignant neoplasm of unspecified part of unspecified bronchus or lung: Secondary | ICD-10-CM

## 2018-04-20 DIAGNOSIS — F329 Major depressive disorder, single episode, unspecified: Secondary | ICD-10-CM | POA: Diagnosis present

## 2018-04-20 DIAGNOSIS — R627 Adult failure to thrive: Secondary | ICD-10-CM | POA: Diagnosis present

## 2018-04-20 DIAGNOSIS — R109 Unspecified abdominal pain: Secondary | ICD-10-CM

## 2018-04-20 DIAGNOSIS — R1310 Dysphagia, unspecified: Secondary | ICD-10-CM | POA: Diagnosis present

## 2018-04-20 DIAGNOSIS — Z791 Long term (current) use of non-steroidal anti-inflammatories (NSAID): Secondary | ICD-10-CM

## 2018-04-20 DIAGNOSIS — Z66 Do not resuscitate: Secondary | ICD-10-CM | POA: Diagnosis not present

## 2018-04-20 DIAGNOSIS — D630 Anemia in neoplastic disease: Secondary | ICD-10-CM | POA: Diagnosis present

## 2018-04-20 DIAGNOSIS — Z833 Family history of diabetes mellitus: Secondary | ICD-10-CM

## 2018-04-20 DIAGNOSIS — Z79891 Long term (current) use of opiate analgesic: Secondary | ICD-10-CM

## 2018-04-20 DIAGNOSIS — I10 Essential (primary) hypertension: Secondary | ICD-10-CM | POA: Diagnosis present

## 2018-04-20 DIAGNOSIS — Z515 Encounter for palliative care: Secondary | ICD-10-CM | POA: Diagnosis not present

## 2018-04-20 DIAGNOSIS — Z801 Family history of malignant neoplasm of trachea, bronchus and lung: Secondary | ICD-10-CM

## 2018-04-20 DIAGNOSIS — E785 Hyperlipidemia, unspecified: Secondary | ICD-10-CM | POA: Diagnosis present

## 2018-04-20 DIAGNOSIS — Z5320 Procedure and treatment not carried out because of patient's decision for unspecified reasons: Secondary | ICD-10-CM | POA: Diagnosis not present

## 2018-04-20 DIAGNOSIS — G893 Neoplasm related pain (acute) (chronic): Secondary | ICD-10-CM | POA: Diagnosis present

## 2018-04-20 DIAGNOSIS — E876 Hypokalemia: Secondary | ICD-10-CM

## 2018-04-20 DIAGNOSIS — I252 Old myocardial infarction: Secondary | ICD-10-CM

## 2018-04-20 DIAGNOSIS — C772 Secondary and unspecified malignant neoplasm of intra-abdominal lymph nodes: Secondary | ICD-10-CM | POA: Diagnosis present

## 2018-04-20 DIAGNOSIS — C7951 Secondary malignant neoplasm of bone: Secondary | ICD-10-CM | POA: Diagnosis present

## 2018-04-20 DIAGNOSIS — R339 Retention of urine, unspecified: Secondary | ICD-10-CM | POA: Diagnosis not present

## 2018-04-20 DIAGNOSIS — I959 Hypotension, unspecified: Secondary | ICD-10-CM

## 2018-04-20 DIAGNOSIS — Z8249 Family history of ischemic heart disease and other diseases of the circulatory system: Secondary | ICD-10-CM

## 2018-04-20 DIAGNOSIS — Z6821 Body mass index (BMI) 21.0-21.9, adult: Secondary | ICD-10-CM

## 2018-04-20 DIAGNOSIS — Z955 Presence of coronary angioplasty implant and graft: Secondary | ICD-10-CM

## 2018-04-20 DIAGNOSIS — C7989 Secondary malignant neoplasm of other specified sites: Secondary | ICD-10-CM | POA: Insufficient documentation

## 2018-04-20 DIAGNOSIS — C7800 Secondary malignant neoplasm of unspecified lung: Secondary | ICD-10-CM | POA: Diagnosis present

## 2018-04-20 DIAGNOSIS — Z7189 Other specified counseling: Secondary | ICD-10-CM

## 2018-04-20 DIAGNOSIS — E43 Unspecified severe protein-calorie malnutrition: Secondary | ICD-10-CM

## 2018-04-20 DIAGNOSIS — C7970 Secondary malignant neoplasm of unspecified adrenal gland: Secondary | ICD-10-CM | POA: Diagnosis present

## 2018-04-20 DIAGNOSIS — D649 Anemia, unspecified: Secondary | ICD-10-CM

## 2018-04-20 DIAGNOSIS — F17210 Nicotine dependence, cigarettes, uncomplicated: Secondary | ICD-10-CM | POA: Diagnosis present

## 2018-04-20 DIAGNOSIS — D638 Anemia in other chronic diseases classified elsewhere: Secondary | ICD-10-CM | POA: Diagnosis present

## 2018-04-20 DIAGNOSIS — Z23 Encounter for immunization: Secondary | ICD-10-CM

## 2018-04-20 DIAGNOSIS — K567 Ileus, unspecified: Secondary | ICD-10-CM

## 2018-04-20 DIAGNOSIS — C7931 Secondary malignant neoplasm of brain: Secondary | ICD-10-CM | POA: Diagnosis present

## 2018-04-20 DIAGNOSIS — R000 Tachycardia, unspecified: Secondary | ICD-10-CM | POA: Insufficient documentation

## 2018-04-20 DIAGNOSIS — C799 Secondary malignant neoplasm of unspecified site: Secondary | ICD-10-CM

## 2018-04-20 DIAGNOSIS — E860 Dehydration: Principal | ICD-10-CM

## 2018-04-20 DIAGNOSIS — Z79899 Other long term (current) drug therapy: Secondary | ICD-10-CM

## 2018-04-20 DIAGNOSIS — I2510 Atherosclerotic heart disease of native coronary artery without angina pectoris: Secondary | ICD-10-CM | POA: Diagnosis present

## 2018-04-20 DIAGNOSIS — Z8673 Personal history of transient ischemic attack (TIA), and cerebral infarction without residual deficits: Secondary | ICD-10-CM

## 2018-04-20 DIAGNOSIS — R64 Cachexia: Secondary | ICD-10-CM | POA: Diagnosis present

## 2018-04-20 DIAGNOSIS — Z0189 Encounter for other specified special examinations: Secondary | ICD-10-CM

## 2018-04-20 HISTORY — DX: Malignant (primary) neoplasm, unspecified: C80.1

## 2018-04-20 LAB — COMPREHENSIVE METABOLIC PANEL
ALT: 15 U/L (ref 0–44)
AST: 17 U/L (ref 15–41)
Albumin: 2.1 g/dL — ABNORMAL LOW (ref 3.5–5.0)
Alkaline Phosphatase: 231 U/L — ABNORMAL HIGH (ref 38–126)
Anion gap: 18 — ABNORMAL HIGH (ref 5–15)
BUN: 17 mg/dL (ref 6–20)
CO2: 19 mmol/L — ABNORMAL LOW (ref 22–32)
Calcium: 8 mg/dL — ABNORMAL LOW (ref 8.9–10.3)
Chloride: 90 mmol/L — ABNORMAL LOW (ref 98–111)
Creatinine, Ser: 0.97 mg/dL (ref 0.61–1.24)
GFR calc Af Amer: >60 mL/min (ref >60)
GFR calc non Af Amer: >60 mL/min (ref >60)
Glucose, Bld: 182 mg/dL — ABNORMAL HIGH (ref 70–99)
Potassium: 2.7 mmol/L — CL (ref 3.5–5.1)
Sodium: 127 mmol/L — ABNORMAL LOW (ref 135–145)
Total Bilirubin: 2.1 mg/dL — ABNORMAL HIGH (ref 0.3–1.2)
Total Protein: 7.4 g/dL (ref 6.5–8.1)

## 2018-04-20 LAB — CBC
HCT: 24.6 % — ABNORMAL LOW (ref 39.0–52.0)
HEMOGLOBIN: 8 g/dL — AB (ref 13.0–17.0)
MCH: 28.3 pg (ref 26.0–34.0)
MCHC: 32.5 g/dL (ref 30.0–36.0)
MCV: 86.9 fL (ref 80.0–100.0)
Platelets: 521 10*3/uL — ABNORMAL HIGH (ref 150–400)
RBC: 2.83 MIL/uL — AB (ref 4.22–5.81)
RDW: 20.8 % — ABNORMAL HIGH (ref 11.5–15.5)
WBC: 12.4 10*3/uL — ABNORMAL HIGH (ref 4.0–10.5)
nRBC: 0 % (ref 0.0–0.2)

## 2018-04-20 LAB — PROTIME-INR
INR: 1.47
Prothrombin Time: 17.7 seconds — ABNORMAL HIGH (ref 11.4–15.2)

## 2018-04-20 LAB — POTASSIUM: Potassium: 3.4 mmol/L — ABNORMAL LOW (ref 3.5–5.1)

## 2018-04-20 MED ORDER — SODIUM CHLORIDE 0.9 % IV SOLN: INTRAVENOUS | Status: DC

## 2018-04-20 MED ORDER — AMLODIPINE BESYLATE 10 MG PO TABS
10.0000 mg | ORAL_TABLET | Freq: Every day | ORAL | Status: DC
  Filled 2018-04-20: qty 1

## 2018-04-20 MED ORDER — SODIUM CHLORIDE 0.9 % IV SOLN
INTRAVENOUS | Status: DC
  Administered 2018-04-20 – 2018-04-22 (×4): via INTRAVENOUS

## 2018-04-20 MED ORDER — SIMVASTATIN 20 MG PO TABS
20.0000 mg | ORAL_TABLET | Freq: Every day | ORAL | Status: DC
  Administered 2018-04-20 – 2018-04-25 (×5): 20 mg via ORAL
  Filled 2018-04-20 (×5): qty 1

## 2018-04-20 MED ORDER — PANTOPRAZOLE SODIUM 40 MG PO TBEC
40.0000 mg | DELAYED_RELEASE_TABLET | Freq: Every day | ORAL | Status: DC
  Administered 2018-04-20: 40 mg via ORAL
  Filled 2018-04-20: qty 1

## 2018-04-20 MED ORDER — POTASSIUM CHLORIDE 10 MEQ/100ML IV SOLN
10.0000 meq | INTRAVENOUS | Status: AC
  Administered 2018-04-20 (×3): 10 meq via INTRAVENOUS
  Filled 2018-04-20 (×3): qty 100

## 2018-04-20 MED ORDER — FOLIC ACID 1 MG PO TABS
1.0000 mg | ORAL_TABLET | Freq: Every day | ORAL | Status: DC
  Administered 2018-04-20 – 2018-04-22 (×2): 1 mg via ORAL
  Filled 2018-04-20 (×2): qty 1

## 2018-04-20 MED ORDER — SODIUM CHLORIDE 0.9 % IV BOLUS
1000.0000 mL | Freq: Once | INTRAVENOUS | Status: AC
  Administered 2018-04-20: 1000 mL via INTRAVENOUS

## 2018-04-20 MED ORDER — POTASSIUM CHLORIDE 10 MEQ/100ML IV SOLN
10.0000 meq | Freq: Once | INTRAVENOUS | Status: AC
  Administered 2018-04-20: 10 meq via INTRAVENOUS
  Filled 2018-04-20: qty 100

## 2018-04-20 MED ORDER — HYDROMORPHONE HCL 1 MG/ML IJ SOLN
1.0000 mg | INTRAMUSCULAR | Status: DC | PRN
  Administered 2018-04-20 – 2018-04-25 (×18): 1 mg via INTRAVENOUS
  Filled 2018-04-20 (×19): qty 1

## 2018-04-20 MED ORDER — LIDOCAINE-PRILOCAINE 2.5-2.5 % EX CREA: TOPICAL_CREAM | Freq: Once | CUTANEOUS | Status: DC

## 2018-04-20 MED ORDER — ONDANSETRON HCL 4 MG PO TABS: 4.0000 mg | ORAL_TABLET | Freq: Four times a day (QID) | ORAL | Status: DC | PRN

## 2018-04-20 MED ORDER — SCOPOLAMINE 1 MG/3DAYS TD PT72: 1.0000 | MEDICATED_PATCH | TRANSDERMAL | Status: DC

## 2018-04-20 MED ORDER — SODIUM CHLORIDE 0.9% FLUSH: 10.0000 mL | INTRAVENOUS | Status: DC | PRN

## 2018-04-20 MED ORDER — ZOLPIDEM TARTRATE 5 MG PO TABS: 5.0000 mg | ORAL_TABLET | Freq: Every evening | ORAL | Status: DC | PRN

## 2018-04-20 MED ORDER — BOOST / RESOURCE BREEZE PO LIQD CUSTOM
1.0000 | Freq: Three times a day (TID) | ORAL | Status: DC
  Administered 2018-04-20: 1 via ORAL

## 2018-04-20 MED ORDER — ENOXAPARIN SODIUM 40 MG/0.4ML ~~LOC~~ SOLN
40.0000 mg | SUBCUTANEOUS | Status: DC
  Administered 2018-04-20 – 2018-04-24 (×4): 40 mg via SUBCUTANEOUS
  Filled 2018-04-20 (×4): qty 0.4

## 2018-04-20 MED ORDER — INFLUENZA VAC SPLIT QUAD 0.5 ML IM SUSY
0.5000 mL | PREFILLED_SYRINGE | INTRAMUSCULAR | Status: AC
  Administered 2018-04-22: 0.5 mL via INTRAMUSCULAR
  Filled 2018-04-20: qty 0.5

## 2018-04-20 MED ORDER — ACETAMINOPHEN 650 MG RE SUPP: 650.0000 mg | Freq: Four times a day (QID) | RECTAL | Status: DC | PRN

## 2018-04-20 MED ORDER — ONDANSETRON HCL 4 MG/2ML IJ SOLN
4.0000 mg | Freq: Four times a day (QID) | INTRAMUSCULAR | Status: DC | PRN
  Administered 2018-04-20 – 2018-04-21 (×3): 4 mg via INTRAVENOUS
  Filled 2018-04-20 (×3): qty 2

## 2018-04-20 MED ORDER — OXYCODONE HCL 5 MG PO TABS
5.0000 mg | ORAL_TABLET | ORAL | Status: DC | PRN
  Administered 2018-04-22 (×2): 5 mg via ORAL
  Filled 2018-04-20 (×2): qty 1

## 2018-04-20 MED ORDER — ONDANSETRON 4 MG PO TBDP
4.0000 mg | ORAL_TABLET | Freq: Three times a day (TID) | ORAL | Status: DC | PRN
  Administered 2018-04-20: 4 mg via ORAL
  Filled 2018-04-20: qty 1

## 2018-04-20 MED ORDER — DEXAMETHASONE 4 MG PO TABS
4.0000 mg | ORAL_TABLET | Freq: Every day | ORAL | Status: DC
  Administered 2018-04-20 – 2018-04-22 (×2): 4 mg via ORAL
  Filled 2018-04-20 (×2): qty 1

## 2018-04-20 MED ORDER — SODIUM CHLORIDE 0.9% FLUSH
10.0000 mL | Freq: Two times a day (BID) | INTRAVENOUS | Status: DC
  Administered 2018-04-22 – 2018-04-27 (×9): 10 mL
  Administered 2018-04-28 – 2018-04-29 (×2): 30 mL
  Administered 2018-04-30 – 2018-05-01 (×3): 10 mL

## 2018-04-20 MED ORDER — DRONABINOL 2.5 MG PO CAPS
2.5000 mg | ORAL_CAPSULE | Freq: Two times a day (BID) | ORAL | Status: DC
  Administered 2018-04-22 (×2): 2.5 mg via ORAL
  Filled 2018-04-20 (×3): qty 1

## 2018-04-20 MED ORDER — ACETAMINOPHEN 325 MG PO TABS: 650.0000 mg | ORAL_TABLET | Freq: Four times a day (QID) | ORAL | Status: DC | PRN

## 2018-04-20 NOTE — Progress Notes (Signed)
Pt arrived feeling like he is going to pass out.  Wife states he has not eaten in weeks and will only take water.  Vital signs pulse 115. BP 96/70.  Alert but drowsy. Ian Roberts in to see pt. Pt is very weak, hard to stay awake.

## 2018-04-20 NOTE — Progress Notes (Signed)
KAYLUB DETIENNE [id number] Admission Data: 04/20/2018 6:49 PM Attending Provider: Merton Border, MD  [id number], Samul Dada, MD Consults/ Treatment Team:   AADVIK ROKER is a 58 y.o. male patient admitted from ED awake, alert  & orientated  X 3,  Full Code, VSS - Blood pressure 100/79, pulse 90, temperature 97.9 F (36.6 C), temperature source Oral, resp. rate 16, height 5\' 11"  (1.803 m), weight 70 kg, SpO2 98 %.,  no c/o shortness of breath, no c/o chest pain, no distress noted.   Allergies:   Allergies  Allergen Reactions  . Hydrocodone Hives     Past Medical History:  Diagnosis Date  . Bone lesion    right shoulder found on x-ray  . CAD (coronary artery disease)   . Cancer ([place])   . Dyslipidemia   . Hypertension   . Hypokalemia   . Necrotizing fasciitis ([place])   . Rotator cuff tear   . ST elevation myocardial infarction (STEMI) of inferior wall (HCC)    Assocaited with paroxysmal atrial fibrillation 05/13/2008; Xience drug eluting stent  . Stroke [facility])       Pt orientation to unit, room and routine. Information packet given to patient/family and safety video watched.  Admission INP armband ID verified with patient/family, and in place. SR up x 2, fall risk assessment complete with Patient and family verbalizing understanding of risks associated with falls. Pt verbalizes an understanding of how to use the call bell and to call for help before getting out of bed.    Will cont to monitor and assist as needed.  [place] N Zandyr Barnhill, RN 04/20/2018 6:49 PM

## 2018-04-20 NOTE — Progress Notes (Signed)
Patient ID: Ian Roberts, male   DOB: 09-11-1960, 58 y.o.   MRN: [id number]  Dx: adenocarcinoma of lung to brain; liver; shoulder  Pt arrives today for OP percutaneous Gastric tube placement  Very weak Almost unable to communicate verbally Hypotensive; tachycardic Verge of syncopal  He does see Dr Delton Coombes and Francene Finders PA at [facility]  I have spoken to [employer] PA She tells me this pt is scheduled for labs and evaluation with Nutritionist tomorrow in [place] Also set up for teaching of G tube in [place] office - which was to be performed tomorrow as well. HHRN to be arranged through [place] also  Will cancel gastric tube placement today Pt would not withstand sedation or procedure today Discussed with Dr Vernard Gambles-- he is agreeable  I am sending pt to [facility] ED for evaluation possible admission Reqeust from [facility] for consideration for G tube to be placed as IP if can.  IR would need new order for Inpt Gastric tube placement if so

## 2018-04-20 NOTE — ED Notes (Signed)
ED Provider at bedside. 

## 2018-04-20 NOTE — Progress Notes (Addendum)
Patient complaining of nausea. Informed patient it is not time for his nausea medicine. Paged MD for different Nausea medicine.

## 2018-04-20 NOTE — ED Triage Notes (Signed)
Pt arrives from short stay, where he was scheduled to have a g-tube placed. When pt arrived to short stay, he was hunched over in his wheelchair, pt states he feels very weak. Pt has hx of liver CA that has metastasized, has had 2 chemo txs. Port in left chest accessed upon arrival with NS infusing at 500 ml/hour. Per short stay RN, family appears to be in denial and is not accepting of patient's declining condition and has had negative responses to hospice recommendations.

## 2018-04-20 NOTE — Progress Notes (Signed)
Per Dr. Laren Everts ok to give 4mg  IV zofran now

## 2018-04-20 NOTE — ED Provider Notes (Signed)
[place] EMERGENCY DEPARTMENT Provider Note   CSN: [id number] Arrival date & time: 04/20/18  1057     History   Chief Complaint Chief Complaint  Patient presents with  . Fatigue    HPI Ian Roberts is a 58 y.o. male who presents with generalized weakness.  Past medical history significant for lung cancer with mets to the brain, right clavicle, liver currently undergoing chemo and radiation, coronary artery disease, hypertension, hyperlipidemia.  His wife and mother are at bedside.  The patient can provide a limited history.  He states that he is here because he is weak.  He was diagnosed with metastatic cancer in November and has been on chemo and radiation.  He overall has been doing poorly.  He has been losing a lot of weight and has a poor appetite.  His wife states that he is lost a significant amount of weight.  Today he was post to go to short stay to have a G-tube inserted.  He was too weak to have this procedure done and was noted to be hypotensive, tachycardic and they were concerned he may pass out.  Labs were drawn at outside facility are notable for worsening anemia and hypokalemia.  He has had some vomiting at home.  He has not had any fevers.  He denies chest pain, shortness of breath, abdominal pain, diarrhea, urinary symptoms.  The patient is currently a full code.  HPI  Past Medical History:  Diagnosis Date  . Bone lesion    right shoulder found on x-ray  . CAD (coronary artery disease)   . Cancer ([place])   . Dyslipidemia   . Hypertension   . Hypokalemia   . Necrotizing fasciitis ([place])   . Rotator cuff tear   . ST elevation myocardial infarction (STEMI) of inferior wall (HCC)    Assocaited with paroxysmal atrial fibrillation 05/13/2008; Xience drug eluting stent  . Stroke [facility])     Patient Active Problem List   Diagnosis Date Noted  . Goals of care, counseling/discussion 02/22/2018  . Metastatic lung cancer (metastasis from lung to other  site), left ([place]) 01/31/2018  . DYSLIPIDEMIA 06/05/2008  . HYPERTENSION, BENIGN ESSENTIAL 06/05/2008  . ACUT MI OTH INF WALL SUBSQT EPIS CARE 06/05/2008  . CORONARY ATHEROSCLEROSIS NATIVE CORONARY ARTERY 06/05/2008    Past Surgical History:  Procedure Laterality Date  . CORONARY ANGIOPLASTY WITH STENT PLACEMENT    . CYST EXCISION    . IR IMAGING GUIDED PORT INSERTION  02/11/2018  . TONSILLECTOMY          Home Medications    Prior to Admission medications   Medication Sig Start Date End Date Taking? Authorizing Provider  amLODipine (NORVASC) 10 MG tablet Take 10 mg by mouth daily.     [provider]  CARBOPLATIN IV Inject into the vein.    [provider]  dexamethasone (DECADRON) 4 MG tablet Take 1 tablet (4 mg total) by mouth daily. 02/22/18   Lockamy, Randi L, NP-C  docusate sodium (COLACE) 100 MG capsule Take 100 mg by mouth daily.     [provider]  dronabinol (MARINOL) 2.5 MG capsule Take 1 capsule (2.5 mg total) by mouth 2 (two) times daily before a meal. 03/21/18   Lockamy, Randi L, NP-C  folic acid (FOLVITE) 1 MG tablet Take 1 tablet (1 mg total) by mouth daily. 02/22/18   Lockamy, Randi L, NP-C  hydrochlorothiazide (HYDRODIURIL) 25 MG tablet Take 25 mg by mouth daily.  [provider]  ibuprofen (ADVIL,MOTRIN) 200 MG tablet Take 600 mg by mouth every 6 (six) hours as needed for headache or moderate pain.  01/21/18   [provider]  lidocaine-prilocaine (EMLA) cream Apply small amount over port site and cover with plastic wrap one hour prior to appointment. 02/24/18   Derek Jack, MD  lisinopril (PRINIVIL,ZESTRIL) 20 MG tablet Take 20 mg by mouth daily.    [provider]  morphine (MSIR) 30 MG tablet Take 1 tablet (30 mg total) by mouth every 6 (six) hours as needed for severe pain. 03/21/18   Lockamy, Randi L, NP-C  omeprazole (PRILOSEC) 20 MG capsule Take 1 po BID x 2 weeks then once a day Patient taking  differently: Take 20 mg by mouth daily as needed (acid reflux). Take 1 po BID x 2 weeks then once a day 04/07/18   Rolland Porter, MD  ondansetron (ZOFRAN ODT) 4 MG disintegrating tablet Take 1 tablet (4 mg total) by mouth every 8 (eight) hours as needed for nausea or vomiting. 03/18/18   Derek Jack, MD  PEMBROLIZUMAB IV Inject into the vein.    [provider]  PEMEtrexed 500 mg/m2 in sodium chloride 0.9 % 100 mL Inject 500 mg/m2 into the vein once.    [provider]  potassium chloride SA (K-DUR,KLOR-CON) 20 MEQ tablet Take 1 tablet (20 mEq total) by mouth 2 (two) times daily. 04/07/18   Rolland Porter, MD  prochlorperazine (COMPAZINE) 10 MG tablet Take 1 tablet (10 mg total) by mouth every 6 (six) hours as needed for nausea or vomiting. 01/31/18   Derek Jack, MD  scopolamine (TRANSDERM-SCOP) 1 MG/3DAYS Place 1 patch (1.5 mg total) onto the skin every 3 (three) days. 02/24/18   Derek Jack, MD  simvastatin (ZOCOR) 20 MG tablet Take 20 mg by mouth daily.     [provider]    Family History Family History  Problem Relation Age of Onset  . Diabetes Mother   . Hypertension Mother   . Lung cancer Father     Social History Social History   Tobacco Use  . Smoking status: Current Every Day Smoker    Packs/day: 0.50    Years: 41.00    Pack years: 20.50    Types: Cigarettes  . Smokeless tobacco: Never Used  Substance Use Topics  . Alcohol use: Never    Frequency: Never  . Drug use: Never     Allergies   Hydrocodone   Review of Systems Review of Systems  Constitutional: Negative for fever.  Respiratory: Negative for shortness of breath.   Cardiovascular: Negative for chest pain.  Gastrointestinal: Positive for nausea and vomiting. Negative for abdominal pain and diarrhea.  Genitourinary: Negative for difficulty urinating and dysuria.  Musculoskeletal: Positive for arthralgias (Right shoulder).  Neurological: Negative for headaches.   All other systems reviewed and are negative.    Physical Exam Updated Vital Signs BP 100/71   Pulse 94   Temp 97.9 F (36.6 C) (Oral)   Resp 19   Ht 5\' 11"  (1.803 m)   Wt 70 kg   SpO2 98%   BMI 21.52 kg/m   Physical Exam Vitals signs and nursing note reviewed.  Constitutional:      General: He is not in acute distress.    Appearance: He is cachectic. He is ill-appearing (Chronically ill).     Comments: Pale, lethargic  HENT:     Head: Normocephalic and atraumatic.  Eyes:     General:  No scleral icterus.       Right eye: No discharge.        Left eye: No discharge.     Conjunctiva/sclera: Conjunctivae normal.     Pupils: Pupils are equal, round, and reactive to light.  Neck:     Musculoskeletal: Normal range of motion.  Cardiovascular:     Rate and Rhythm: Normal rate and regular rhythm.  Pulmonary:     Effort: Pulmonary effort is normal. No respiratory distress.     Breath sounds: Normal breath sounds.  Abdominal:     General: There is no distension.     Palpations: Abdomen is soft.     Tenderness: There is no abdominal tenderness.  Skin:    General: Skin is warm and dry.  Neurological:     Mental Status: He is oriented to person, place, and time. He is lethargic.  Psychiatric:        Behavior: Behavior normal. Behavior is cooperative.      ED Treatments / Results  Labs (all labs ordered are listed, but only abnormal results are displayed) Labs Reviewed  HIV ANTIBODY (ROUTINE TESTING W REFLEX)  POTASSIUM    EKG None  Radiology No results found.  Procedures Procedures (including critical care time)  Medications Ordered in ED Medications  amLODipine (NORVASC) tablet 10 mg (has no administration in time range)  simvastatin (ZOCOR) tablet 20 mg (has no administration in time range)  dexamethasone (DECADRON) tablet 4 mg (has no administration in time range)  dronabinol (MARINOL) capsule 2.5 mg (has no administration in time range)  pantoprazole  (PROTONIX) EC tablet 40 mg (has no administration in time range)  ondansetron (ZOFRAN-ODT) disintegrating tablet 4 mg (has no administration in time range)  scopolamine (TRANSDERM-SCOP) 1 MG/3DAYS 1.5 mg (has no administration in time range)  folic acid (FOLVITE) tablet 1 mg (has no administration in time range)  lidocaine-prilocaine (EMLA) cream (has no administration in time range)  enoxaparin (LOVENOX) injection 40 mg (has no administration in time range)  0.9 %  sodium chloride infusion ( Intravenous Transfusing/Transfer 04/20/18 1417)  acetaminophen (TYLENOL) tablet 650 mg (has no administration in time range)    Or  acetaminophen (TYLENOL) suppository 650 mg (has no administration in time range)  oxyCODONE (Oxy IR/ROXICODONE) immediate release tablet 5 mg (has no administration in time range)  HYDROmorphone (DILAUDID) injection 1 mg (1 mg Intravenous Given 04/20/18 1346)  ondansetron (ZOFRAN) tablet 4 mg ( Oral See Alternative 04/20/18 1345)    Or  ondansetron (ZOFRAN) injection 4 mg (4 mg Intravenous Given 04/20/18 1345)  zolpidem (AMBIEN) tablet 5 mg (has no administration in time range)  potassium chloride 10 mEq in 100 mL IVPB (has no administration in time range)  sodium chloride 0.9 % bolus 1,000 mL (0 mLs Intravenous Stopped 04/20/18 1313)  potassium chloride 10 mEq in 100 mL IVPB (0 mEq Intravenous Stopped 04/20/18 1323)     Initial Impression / Assessment and Plan / ED Course  I have reviewed the triage vital signs and the nursing notes.  Pertinent labs & imaging results that were available during my care of the patient were reviewed by me and considered in my medical decision making (see chart for details).  58 year old male presents with weakness in the setting of metastatic lung cancer.  He is hypotensive and mildly tachycardic.  Exam is remarkable for his ill and cachectic appearance.  Blood work was reviewed from outside facility.  He has a mild leukocytosis of 12.4.  His  hemoglobin has dropped to 8.  His CMP is remarkable for hyponatremia, hypo-kalemia.  His alkaline phosphatase is elevated which has been in the past.  Shared visit with Dr. Vanita Panda.  Had discussion with the family regarding goals of care.  At this time the patient wants to be full code.  Discussed with Dr. Laren Everts with [facility] regarding [facility] who will accept the patient.  Recommend palliative care consult while inpatient.  The patient has a poor prognosis.  Final Clinical Impressions(s) / ED Diagnoses   Final diagnoses:  Weakness  Metastatic cancer ([place])  Hypokalemia  Dehydration  Anemia, unspecified type    ED Discharge Orders    None       Recardo Evangelist, PA-C 04/20/18 1424    Carmin Muskrat, MD 04/21/18 1606

## 2018-04-20 NOTE — H&P (Addendum)
[facility]                                                                                    Patient Demographics  Ian Roberts, is a 58 y.o. male  CSN: [id number]  MRN: [id number]  DOB - 1960/06/12  Admit Date - 04/20/2018  Outpatient Primary MD for the patient is Neale Burly, MD   With History of -  Past Medical History:  Diagnosis Date  . Bone lesion    right shoulder found on x-ray  . CAD (coronary artery disease)   . Cancer ([place])   . Dyslipidemia   . Hypertension   . Hypokalemia   . Necrotizing fasciitis ([place])   . Rotator cuff tear   . ST elevation myocardial infarction (STEMI) of inferior wall (HCC)    Assocaited with paroxysmal atrial fibrillation 05/13/2008; Xience drug eluting stent  . Stroke [facility])       Past Surgical History:  Procedure Laterality Date  . CORONARY ANGIOPLASTY WITH STENT PLACEMENT    . CYST EXCISION    . IR IMAGING GUIDED PORT INSERTION  02/11/2018  . TONSILLECTOMY      in for   Chief Complaint  Patient presents with  . Fatigue     HPI  Ian Roberts  is a 58 y.o. 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  His situation has been deteriorating slowly in the last few weeks and he was scheduled to have a PEG tube placement today as outpatient when he was noted to be extremely weak and hypokalemic.  Patient was sent to our facility for evaluation.  Patient reports decreased p.o. intake .  Reports increased pain especially at this shoulder.  Denies any fever chills or cough.  Work-up confirmed the hypokalemia and the patient was started on IV fluids and potassium replacement.  Patient reports nausea but no vomiting .     Review of Systems    In addition to the HPI above,  No Fever-chills, No Headache, No changes with Vision or hearing, No Chest pain, Cough or Shortness of  Breath, No Abdominal pain,  Bowel movements are regular, No Blood in stool or Urine, No dysuria, No new skin rashes or bruises, No new joints pains-aches,  No recent weight gain or loss, No polyuria, polydypsia or polyphagia, No significant Mental Stressors.  A full 10 point Review of Systems was done, except as stated above, all other Review of Systems were negative.   Social History Social History   Tobacco Use  . Smoking status: Current Every Day Smoker    Packs/day: 0.50    Years: 41.00    Pack years: 20.50    Types: Cigarettes  . Smokeless tobacco: Never Used  Substance Use Topics  . Alcohol use: Never    Frequency: Never     Family History Family History  Problem Relation Age of Onset  . Diabetes Mother   . Hypertension Mother   . Lung cancer Father    *  Prior to Admission medications   Medication Sig  Start Date End Date Taking? Authorizing Provider  amLODipine (NORVASC) 10 MG tablet Take 10 mg by mouth daily.     [provider]  CARBOPLATIN IV Inject into the vein.    [provider]  dexamethasone (DECADRON) 4 MG tablet Take 1 tablet (4 mg total) by mouth daily. 02/22/18   Lockamy, Randi L, NP-C  docusate sodium (COLACE) 100 MG capsule Take 100 mg by mouth daily.     [provider]  dronabinol (MARINOL) 2.5 MG capsule Take 1 capsule (2.5 mg total) by mouth 2 (two) times daily before a meal. 03/21/18   Lockamy, Randi L, NP-C  folic acid (FOLVITE) 1 MG tablet Take 1 tablet (1 mg total) by mouth daily. 02/22/18   Lockamy, Randi L, NP-C  hydrochlorothiazide (HYDRODIURIL) 25 MG tablet Take 25 mg by mouth daily.     [provider]  ibuprofen (ADVIL,MOTRIN) 200 MG tablet Take 600 mg by mouth every 6 (six) hours as needed for headache or moderate pain.  01/21/18   [provider]  lidocaine-prilocaine (EMLA) cream Apply small amount over port site and cover with plastic wrap one hour prior to appointment. 02/24/18    Derek Jack, MD  lisinopril (PRINIVIL,ZESTRIL) 20 MG tablet Take 20 mg by mouth daily.    [provider]  morphine (MSIR) 30 MG tablet Take 1 tablet (30 mg total) by mouth every 6 (six) hours as needed for severe pain. 03/21/18   Lockamy, Randi L, NP-C  omeprazole (PRILOSEC) 20 MG capsule Take 1 po BID x 2 weeks then once a day Patient taking differently: Take 20 mg by mouth daily as needed (acid reflux). Take 1 po BID x 2 weeks then once a day 04/07/18   Rolland Porter, MD  ondansetron (ZOFRAN ODT) 4 MG disintegrating tablet Take 1 tablet (4 mg total) by mouth every 8 (eight) hours as needed for nausea or vomiting. 03/18/18   Derek Jack, MD  PEMBROLIZUMAB IV Inject into the vein.    [provider]  PEMEtrexed 500 mg/m2 in sodium chloride 0.9 % 100 mL Inject 500 mg/m2 into the vein once.    [provider]  potassium chloride SA (K-DUR,KLOR-CON) 20 MEQ tablet Take 1 tablet (20 mEq total) by mouth 2 (two) times daily. 04/07/18   Rolland Porter, MD  prochlorperazine (COMPAZINE) 10 MG tablet Take 1 tablet (10 mg total) by mouth every 6 (six) hours as needed for nausea or vomiting. 01/31/18   Derek Jack, MD  scopolamine (TRANSDERM-SCOP) 1 MG/3DAYS Place 1 patch (1.5 mg total) onto the skin every 3 (three) days. 02/24/18   Derek Jack, MD  simvastatin (ZOCOR) 20 MG tablet Take 20 mg by mouth daily.     [provider]    Allergies  Allergen Reactions  . Hydrocodone Hives    Physical Exam  Vitals  Blood pressure 102/72, pulse (!) 102, temperature 97.9 F (36.6 C), temperature source Oral, resp. rate 17, height 5\' 11"  (1.803 m), weight 70 kg, SpO2 98 %.   1. General chronically ill male, looks dehydrated and tired  2.  Flat affect and insight, Not Suicidal or Homicidal, Awake Alert, Oriented X 3.  3. No F.N deficits, grossly, patient moving all extremities.  4. Ears and Eyes appear Normal, Conjunctivae pale, PERRLA.  Dry oral  Mucosa.  5. Supple Neck, No JVD, No cervical lymphadenopathy appriciated, No Carotid Bruits.  6. Symmetrical Chest wall movement, Good air movement bilaterally, decreased breath sounds bilaterally.  Left Port-A-Cath noted  7. RRR, No Gallops, Rubs or Murmurs, No Parasternal Heave.  8. Positive Bowel Sounds, Abdomen Soft, Non tender, No organomegaly appriciated,No rebound -guarding or rigidity.  9.  No Cyanosis, Normal Skin Turgor, No Skin Rash or Bruise.  10.  Decreased muscle tone,  joints appear normal , no effusions, Normal ROM.   Data Review  CBC Recent Labs  Lab 04/20/18 1008  WBC 12.4*  HGB 8.0*  HCT 24.6*  PLT 521*  MCV 86.9  MCH 28.3  MCHC 32.5  RDW 20.8*   ------------------------------------------------------------------------------------------------------------------  Chemistries  Recent Labs  Lab 04/20/18 1008  NA 127*  K 2.7*  CL 90*  CO2 19*  GLUCOSE 182*  BUN 17  CREATININE 0.97  CALCIUM 8.0*  AST 17  ALT 15  ALKPHOS 231*  BILITOT 2.1*   ------------------------------------------------------------------------------------------------------------------ estimated creatinine clearance is 84.2 mL/min (by C-G formula based on SCr of 0.97 mg/dL). ------------------------------------------------------------------------------------------------------------------ No results for input(s): TSH, T4TOTAL, T3FREE, THYROIDAB in the last 72 hours.  Invalid input(s): FREET3   Coagulation profile Recent Labs  Lab 04/20/18 1035  INR 1.47   ------------------------------------------------------------------------------------------------------------------- No results for input(s): DDIMER in the last 72 hours. -------------------------------------------------------------------------------------------------------------------  Cardiac Enzymes No results for input(s): CKMB, TROPONINI, MYOGLOBIN in the last 168 hours.  Invalid input(s):  CK ------------------------------------------------------------------------------------------------------------------ Invalid input(s): POCBNP   ---------------------------------------------------------------------------------------------------------------  Urinalysis    Component Value Date/Time   COLORURINE AMBER (A) 03/16/2018 0200   APPEARANCEUR CLEAR 03/16/2018 0200   LABSPEC 1.021 03/16/2018 0200   PHURINE 6.0 03/16/2018 0200   GLUCOSEU NEGATIVE 03/16/2018 0200   HGBUR NEGATIVE 03/16/2018 0200   BILIRUBINUR SMALL (A) 03/16/2018 0200   KETONESUR 20 (A) 03/16/2018 0200   PROTEINUR 100 (A) 03/16/2018 0200   NITRITE NEGATIVE 03/16/2018 0200   LEUKOCYTESUR NEGATIVE 03/16/2018 0200    ----------------------------------------------------------------------------------------------------------------   Imaging results:   No results found.    Personally reviewed Old Chart from 103-04-2017  Assessment & Plan  Dehydration; IV fluids  Failure to thrive with decreased p.o. intake Patient was scheduled for PEG tube, postponed due to dehydration hypokalemia Continue with IV fluids  Hypokalemia; replete  Metastatic lung cancer, reviewed progress note on 1211/2019, advanced on chemotherapy. Palliative care consult ordered today  Chronic pain; switch pain medications to IV Dilaudid  History of intractable nausea and vomiting; continue with Zofran as needed  Hypertension; hold ACE inhibitor    DVT Prophylaxis Lovenox  AM Labs Ordered, also please review Full Orders  Family Communication: Admission, patients condition and plan of care including tests being ordered have been discussed with the patient and family who indicate understanding and agree with the plan and Code Status.  Code Status full  Disposition Plan: Home with home health  Time spent in minutes : 32 minutes  Condition GUARDED , prognosis grave   @SIGNATURE @

## 2018-04-21 ENCOUNTER — Ambulatory Visit (HOSPITAL_COMMUNITY): Payer: Self-pay | Admitting: Hematology

## 2018-04-21 ENCOUNTER — Ambulatory Visit (HOSPITAL_COMMUNITY): Payer: Self-pay

## 2018-04-21 ENCOUNTER — Inpatient Hospital Stay (HOSPITAL_COMMUNITY): Payer: Medicaid Other

## 2018-04-21 ENCOUNTER — Other Ambulatory Visit (HOSPITAL_COMMUNITY): Payer: Self-pay

## 2018-04-21 DIAGNOSIS — Z23 Encounter for immunization: Secondary | ICD-10-CM | POA: Diagnosis not present

## 2018-04-21 DIAGNOSIS — F17210 Nicotine dependence, cigarettes, uncomplicated: Secondary | ICD-10-CM | POA: Diagnosis present

## 2018-04-21 DIAGNOSIS — E785 Hyperlipidemia, unspecified: Secondary | ICD-10-CM | POA: Diagnosis present

## 2018-04-21 DIAGNOSIS — R627 Adult failure to thrive: Secondary | ICD-10-CM | POA: Diagnosis present

## 2018-04-21 DIAGNOSIS — I480 Paroxysmal atrial fibrillation: Secondary | ICD-10-CM | POA: Diagnosis present

## 2018-04-21 DIAGNOSIS — Z515 Encounter for palliative care: Secondary | ICD-10-CM

## 2018-04-21 DIAGNOSIS — R109 Unspecified abdominal pain: Secondary | ICD-10-CM

## 2018-04-21 DIAGNOSIS — I10 Essential (primary) hypertension: Secondary | ICD-10-CM | POA: Diagnosis present

## 2018-04-21 DIAGNOSIS — F329 Major depressive disorder, single episode, unspecified: Secondary | ICD-10-CM | POA: Diagnosis present

## 2018-04-21 DIAGNOSIS — R64 Cachexia: Secondary | ICD-10-CM | POA: Diagnosis present

## 2018-04-21 DIAGNOSIS — R531 Weakness: Secondary | ICD-10-CM

## 2018-04-21 DIAGNOSIS — C3492 Malignant neoplasm of unspecified part of left bronchus or lung: Secondary | ICD-10-CM

## 2018-04-21 DIAGNOSIS — C772 Secondary and unspecified malignant neoplasm of intra-abdominal lymph nodes: Secondary | ICD-10-CM | POA: Diagnosis present

## 2018-04-21 DIAGNOSIS — I252 Old myocardial infarction: Secondary | ICD-10-CM | POA: Diagnosis not present

## 2018-04-21 DIAGNOSIS — E860 Dehydration: Secondary | ICD-10-CM | POA: Diagnosis present

## 2018-04-21 DIAGNOSIS — C7800 Secondary malignant neoplasm of unspecified lung: Secondary | ICD-10-CM | POA: Diagnosis present

## 2018-04-21 DIAGNOSIS — G893 Neoplasm related pain (acute) (chronic): Secondary | ICD-10-CM | POA: Diagnosis present

## 2018-04-21 DIAGNOSIS — E43 Unspecified severe protein-calorie malnutrition: Secondary | ICD-10-CM | POA: Diagnosis present

## 2018-04-21 DIAGNOSIS — C7931 Secondary malignant neoplasm of brain: Secondary | ICD-10-CM | POA: Diagnosis present

## 2018-04-21 DIAGNOSIS — E876 Hypokalemia: Secondary | ICD-10-CM | POA: Diagnosis present

## 2018-04-21 DIAGNOSIS — Z7189 Other specified counseling: Secondary | ICD-10-CM

## 2018-04-21 DIAGNOSIS — C7970 Secondary malignant neoplasm of unspecified adrenal gland: Secondary | ICD-10-CM | POA: Diagnosis present

## 2018-04-21 DIAGNOSIS — I2510 Atherosclerotic heart disease of native coronary artery without angina pectoris: Secondary | ICD-10-CM | POA: Diagnosis present

## 2018-04-21 DIAGNOSIS — C787 Secondary malignant neoplasm of liver and intrahepatic bile duct: Secondary | ICD-10-CM | POA: Diagnosis present

## 2018-04-21 DIAGNOSIS — C7951 Secondary malignant neoplasm of bone: Secondary | ICD-10-CM | POA: Diagnosis present

## 2018-04-21 DIAGNOSIS — L8990 Pressure ulcer of unspecified site, unspecified stage: Secondary | ICD-10-CM

## 2018-04-21 DIAGNOSIS — K567 Ileus, unspecified: Secondary | ICD-10-CM | POA: Diagnosis present

## 2018-04-21 DIAGNOSIS — R6251 Failure to thrive (child): Secondary | ICD-10-CM

## 2018-04-21 DIAGNOSIS — Z66 Do not resuscitate: Secondary | ICD-10-CM | POA: Diagnosis not present

## 2018-04-21 LAB — BASIC METABOLIC PANEL
Anion gap: 14 (ref 5–15)
BUN: 18 mg/dL (ref 6–20)
CO2: 19 mmol/L — ABNORMAL LOW (ref 22–32)
Calcium: 7.6 mg/dL — ABNORMAL LOW (ref 8.9–10.3)
Chloride: 99 mmol/L (ref 98–111)
Creatinine, Ser: 0.9 mg/dL (ref 0.61–1.24)
GFR calc Af Amer: >60 mL/min (ref >60)
GFR calc non Af Amer: >60 mL/min (ref >60)
GLUCOSE: 84 mg/dL (ref 70–99)
Potassium: 3.5 mmol/L (ref 3.5–5.1)
Sodium: 132 mmol/L — ABNORMAL LOW (ref 135–145)

## 2018-04-21 LAB — CBC
HCT: 23.2 % — ABNORMAL LOW (ref 39.0–52.0)
Hemoglobin: 7.6 g/dL — ABNORMAL LOW (ref 13.0–17.0)
MCH: 29 pg (ref 26.0–34.0)
MCHC: 32.8 g/dL (ref 30.0–36.0)
MCV: 88.5 fL (ref 80.0–100.0)
Platelets: 466 10*3/uL — ABNORMAL HIGH (ref 150–400)
RBC: 2.62 MIL/uL — ABNORMAL LOW (ref 4.22–5.81)
RDW: 21.6 % — ABNORMAL HIGH (ref 11.5–15.5)
WBC: 9.1 10*3/uL (ref 4.0–10.5)
nRBC: 0 % (ref 0.0–0.2)

## 2018-04-21 LAB — HIV ANTIBODY (ROUTINE TESTING W REFLEX): HIV Screen 4th Generation wRfx: NONREACTIVE

## 2018-04-21 MED ORDER — PROMETHAZINE HCL 25 MG/ML IJ SOLN
25.0000 mg | Freq: Four times a day (QID) | INTRAMUSCULAR | Status: DC | PRN
  Administered 2018-04-21: 25 mg via INTRAVENOUS
  Filled 2018-04-21: qty 1

## 2018-04-21 MED ORDER — PANTOPRAZOLE SODIUM 40 MG IV SOLR
40.0000 mg | INTRAVENOUS | Status: DC
  Administered 2018-04-21 – 2018-04-26 (×6): 40 mg via INTRAVENOUS
  Filled 2018-04-21 (×6): qty 40

## 2018-04-21 MED ORDER — PROMETHAZINE HCL 25 MG/ML IJ SOLN
12.5000 mg | Freq: Four times a day (QID) | INTRAMUSCULAR | Status: DC | PRN
  Administered 2018-04-22: 12.5 mg via INTRAVENOUS
  Filled 2018-04-21 (×2): qty 1

## 2018-04-21 MED ORDER — PROCHLORPERAZINE EDISYLATE 10 MG/2ML IJ SOLN
10.0000 mg | INTRAMUSCULAR | Status: DC | PRN
  Administered 2018-04-21 – 2018-04-23 (×3): 10 mg via INTRAVENOUS
  Filled 2018-04-21 (×3): qty 2

## 2018-04-21 MED ORDER — ONDANSETRON HCL 4 MG PO TABS
4.0000 mg | ORAL_TABLET | Freq: Four times a day (QID) | ORAL | Status: DC
  Filled 2018-04-21: qty 1

## 2018-04-21 MED ORDER — ONDANSETRON HCL 4 MG/2ML IJ SOLN
4.0000 mg | Freq: Four times a day (QID) | INTRAMUSCULAR | Status: DC
  Administered 2018-04-21 – 2018-04-23 (×8): 4 mg via INTRAVENOUS
  Filled 2018-04-21 (×8): qty 2

## 2018-04-21 MED ORDER — ORAL CARE MOUTH RINSE
15.0000 mL | Freq: Two times a day (BID) | OROMUCOSAL | Status: DC
  Administered 2018-04-22 – 2018-04-30 (×16): 15 mL via OROMUCOSAL

## 2018-04-21 NOTE — Progress Notes (Signed)
PT Cancellation Note  Patient Details Name: Ian Roberts MRN: [id number] DOB: 05-05-1960   Cancelled Treatment:    Reason Eval/Treat Not Completed: Medical issues which prohibited therapy Spoke to RN, who requests that PT hold for now due to medical status. Will attempt to try back on next day of service.    Deniece Ree PT, DPT, CBIS  Supplemental Physical Therapist [facility]    Pager [phone] Acute Rehab Office [phone]

## 2018-04-21 NOTE — Progress Notes (Signed)
Initial Nutrition Assessment  DOCUMENTATION CODES:   Severe malnutrition in context of chronic illness, Underweight  INTERVENTION:   - Monitor for diet advancement and supplement as appropriate  If G-tube placement is within [place] after discussion with palliative care team, recommend initiation of bolus tube feeding: - Provide Osmolite 1.5 cal formula 100 ml bolus via G-tube and advance volume by 50 ml each subsequent bolus until goal volume is met  Goal tube feeding regimen: - Osmolite 1.5 cal 474 ml (2 cans) three times daily (1422 ml daily or 6 cans daily) with free water 30 ml per tube before and after each feeding - Pro-stat 30 ml TID - Additional free water 250 ml QID  Recommended goal tube feeding regimen provides 2433 kcal, 134 grams of protein, and 2260 ml of H2O (100% of needs).  If G-tube placed and TF started, monitor magnesium, potassium, and phosphorus daily for at least 3 days, MD to replete as needed, as pt is at risk for refeeding syndrome given severe malnutrition.  NUTRITION DIAGNOSIS:   Severe Malnutrition related to chronic illness (lung cancer w/ mets to brain, right clavicle, and liver) as evidenced by severe fat depletion, severe muscle depletion, moderate fat depletion, moderate muscle depletion, percent weight loss (26.5% weight loss in less than 3 months).  GOAL:   Patient will meet greater than or equal to 90% of their needs  MONITOR:   Weight trends, I & O's, Diet advancement, Labs, Skin, Other (GOC)  REASON FOR ASSESSMENT:   Malnutrition Screening Tool    ASSESSMENT:   58 year old male who presented to the ED on 1/29 from short stay where he was scheduled to have a G-tube placed but was found to be extremely weak and hypokalemic. PMH significant for lung cancer with mets to the brain, right clavicle, liver currently undergoing chemo and radiation, CAD, HTN, HLD.  Palliative care consult pending.  Discussed pt with RN. Per RN, pt made NPO this  AM due to intractable N/V.  No family present at time of RD visit. Per review of H&P, "patient reports decreased p.o. intake." Pt also with N/V at home and intractable N/V during admission. Per MD, pt with poor prognosis.  Noted untouched Boost Breeze supplement at bedside.  Per available weight history in chart, pt with 25.3 kg weight loss since 01/22/18. This is a 26.5% weight loss which is severe and significant for timeframe.  Medications reviewed and include: Marinol, Boost Breeze TID, folic acid, Zofran 4 mg q 6 hours, Protonix IVF: NS @ 100 ml/hr  Labs reviewed: sodium 132 (L), hemoglobin 7.6 (L)  NUTRITION - FOCUSED PHYSICAL EXAM:    Most Recent Value  Orbital Region  Moderate depletion  Upper Arm Region  Moderate depletion  Thoracic and Lumbar Region  Severe depletion  Buccal Region  Moderate depletion  Temple Region  Moderate depletion  Clavicle Bone Region  Severe depletion  Clavicle and Acromion Bone Region  Severe depletion  Scapular Bone Region  Severe depletion  Dorsal Hand  Severe depletion  Patellar Region  Severe depletion  Anterior Thigh Region  Severe depletion  Posterior Calf Region  Severe depletion  Edema (RD Assessment)  None  Hair  Reviewed  Eyes  Unable to assess  Skin  Reviewed  Nails  Reviewed       Diet Order:   Diet Order            Diet NPO time specified  Diet effective now  EDUCATION NEEDS:   Not appropriate for education at this time  Skin:  Skin Assessment: Skin Integrity Issues: Stage III: sacrum  Last BM:  1/28  Height:   Ht Readings from Last 1 Encounters:  04/20/18 '5\' 11"'  (1.803 m)    Weight:   Wt Readings from Last 15 Encounters:  04/20/18 70 kg  04/07/18 71.2 kg  03/31/18 71.4 kg  03/24/18 72.3 kg  03/16/18 78.6 kg  03/08/18 78.6 kg  03/02/18 78.7 kg  02/22/18 79.4 kg  02/11/18 84.4 kg  02/08/18 84.4 kg  02/03/18 84.1 kg  01/31/18 84.6 kg  01/22/18 95.3 kg   Ideal Body Weight:  78.2  kg  BMI:  Body mass index is 21.52 kg/m.  Estimated Nutritional Needs:   Kcal:  2400-2600  Protein:  125-140 grams  Fluid:  >/= 2.4 L    Gaynell Face, MS, RD, LDN Inpatient Clinical Dietitian Pager: [phone] Weekend/After Hours: [phone]

## 2018-04-21 NOTE — Consult Note (Signed)
Consultation Note Date: 04/21/2018   Patient Name: Ian Roberts  DOB: 1961/01/07  MRN: [id number]  Age / Sex: 58 y.o., male  PCP: Neale Burly, MD Referring Physician: Aline August, MD  Reason for Consultation: Establishing goals of care  HPI/Patient Profile: 58 y.o. 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 IVF initiated. Diagnosed with metastatic lung cancer in November 2019 s/p two rounds of chemotherapy and whole brain radiation. This admission, patient with intractable nausea and vomiting. Abdominal xray pending this afternoon. Palliative medicine consultation for goals of care.   Clinical Assessment and Goals of Care:  I have reviewed medical records, discussed with Dr. Starla Link, and assessed the patient at bedside. Mr. Boniface is drowsy and does not engage in conversation. He is dry heaving and with emesis bag in hand. Per Dr. Starla Link, patient remains nauseous and vomiting. An abdominal xray has been ordered for today. No family at bedside.   Spoke with wife [name]) and daughter Veterinary surgeon) separately via telephone to arrange [place] meeting for this afternoon or tomorrow. [employer] are available to meet with PMT provider tomorrow, 04/22/18 at 9:15am.   Introduced Palliative Medicine as specialized medical care for people living with serious illness. It focuses on providing relief from the symptoms and stress of a serious illness.  Spoke with [name] for >30 minutes. She shares a brief life review of "Ian Roberts." They have been married for 37 years. 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 She  shares their devastation with the loss of their son ~3 years ago and estrangement from him and grandchildren prior to his death. Then about a year ago, Ian Roberts had a prolonged hospitalization including 3 surgeries in 4 days for perirectal abscess. Then, unfortunate diagnosis of metastatic cancer this past November.   [name] states "he has been through hell and back" since diagnosis in November. Severe decline in nutritional status and 24 lb weight loss. Followed by oncology in [place]. He has completed 2 rounds of chemotherapy and whole brain radiation. Nutritional status has been declining, therefore family was requesting PEG tube to be placed.   Explored Cindy and [name]'s understanding of his diagnosis and prognosis per conversations with oncology. [name] states "not aiming for a cure" but that they were told he could live for years. She speaks of "managing and stabilizing." [name] shares her understanding "treatable, not curable" I confirmed oncology interventions being palliative in nature with metastases to multiple areas. Explained performance status guiding oncology team with ability to further give chemotherapy or immunotherapy. Explained my fear of progressive cancer with decreasing performance status and disease trajectory with metastatic cancer.    [name] shares how hard it has been to grasp terminal diagnosis and feeling as if she is "  not doing enough" to make him eat/drink. Marinol has not been helping. She shares her belief of "literally watching him die" and that she understanding he will not "get well" but her wish that he is not "dehydrated" or "malnourished."   She further explains ongoing depression since death of his son, prolonged hospitalization from abscess, and now cancer diagnosis. "Maybe he has given up?" Explored whether he has spoke to her about dying. He has not spoken of dying but often wishes for her to come and sit in the same room with him so he is not left alone.   "God  helps" [name] and the family cope. "He's got this no matter which way it goes." Emotional/spiritual support provided.   Reassured that we will all further discuss GOC and expectations tomorrow morning with hopes that Ian Roberts will be alert enough to participate in conversation. [name] appreciative of our conversation and again plans to meet with me tomorrow morning at 9:15am.     SUMMARY OF RECOMMENDATIONS    Continue FULL code/FULL scope treatment. Spoke with wife and daughter via telephone. Family meeting scheduled for tomorrow, 04/22/2018 at 9:15am to discuss goals of care.   Code Status/Advance Care Planning:  Full code  Symptom Management:   Per attending  Palliative Prophylaxis:   Aspiration, Delirium Protocol, Frequent Pain Assessment, Oral Care and Turn Reposition  Additional Recommendations (Limitations, Scope, Preferences):  Full Scope Treatment  Psycho-social/Spiritual:   Desire for further Chaplaincy support:yes  Additional Recommendations: Caregiving  Support/Resources, Compassionate Wean Education and Education on Hospice  Prognosis:   Poor prognosis with metastatic lung cancer and severely declining functional, cognitive, and nutritional status.   Discharge Planning: To Be Determined      Primary Diagnoses: Present on Admission: . Hypokalemia   I have reviewed the medical record, interviewed the patient and family, and examined the patient. The following aspects are pertinent.  Past Medical History:  Diagnosis Date  . Bone lesion    right shoulder found on x-ray  . CAD (coronary artery disease)   . Cancer ([place])   . Dyslipidemia   . Hypertension   . Hypokalemia   . Necrotizing fasciitis ([place])   . Rotator cuff tear   . ST elevation myocardial infarction (STEMI) of inferior wall (HCC)    Assocaited with paroxysmal atrial fibrillation 05/13/2008; Xience drug eluting stent  . Stroke [facility])    Social History   Socioeconomic History  . Marital status:  Married    Spouse name: Not on file  . Number of children: 2  . Years of education: Not on file  . Highest education level: Not on file  Occupational History  . Occupation: Full time Dealer    Comment: unemployed  Social Needs  . Financial resource strain: Not very hard  . Food insecurity:    Worry: Never true    Inability: Never true  . Transportation needs:    Medical: No    Non-medical: No  Tobacco Use  . Smoking status: Current Every Day Smoker    Packs/day: 0.50    Years: 41.00    Pack years: 20.50    Types: Cigarettes  . Smokeless tobacco: Never Used  Substance and Sexual Activity  . Alcohol use: Never    Frequency: Never  . Drug use: Never  . Sexual activity: Not Currently  Lifestyle  . Physical activity:    Days per week: 0 days    Minutes per session: 0 min  . Stress: To some extent  Relationships  .  Social connections:    Talks on phone: More than three times a week    Gets together: More than three times a week    Attends religious service: Never    Active member of club or organization: No    Attends meetings of clubs or organizations: Never    Relationship status: Married  Other Topics Concern  . Not on file  Social History Narrative   Married   Family History  Problem Relation Age of Onset  . Diabetes Mother   . Hypertension Mother   . Lung cancer Father    Scheduled Meds: . amLODipine  10 mg Oral Daily  . dexamethasone  4 mg Oral Daily  . dronabinol  2.5 mg Oral BID AC  . enoxaparin (LOVENOX) injection  40 mg Subcutaneous Q24H  . folic acid  1 mg Oral Daily  . Influenza vac split quadrivalent PF  0.5 mL Intramuscular Tomorrow-1000  . mouth rinse  15 mL Mouth Rinse BID  . ondansetron (ZOFRAN) IV  4 mg Intravenous Q6H   Or  . ondansetron  4 mg Oral Q6H  . pantoprazole (PROTONIX) IV  40 mg Intravenous Q24H  . [START ON 04/23/2018] scopolamine  1 patch Transdermal Q72H  . simvastatin  20 mg Oral Daily  . sodium chloride flush  10-40 mL  Intracatheter Q12H   Continuous Infusions: . sodium chloride 100 mL/hr at 04/21/18 0145   PRN Meds:.acetaminophen **OR** acetaminophen, HYDROmorphone (DILAUDID) injection, ondansetron, oxyCODONE, prochlorperazine, promethazine, sodium chloride flush, zolpidem Medications Prior to Admission:  Prior to Admission medications   Medication Sig Start Date End Date Taking? Authorizing Provider  acetaminophen (TYLENOL) 500 MG tablet Take 1,000 mg by mouth every 6 (six) hours as needed for mild pain.   Yes [provider]  CARBOPLATIN IV Inject into the vein.   Yes [provider]  dexamethasone (DECADRON) 4 MG tablet Take 1 tablet (4 mg total) by mouth daily. 02/22/18  Yes Lockamy, Randi L, NP-C  famotidine (PEPCID) 20 MG tablet Take 20 mg by mouth daily as needed for heartburn or indigestion.   Yes [provider]  folic acid (FOLVITE) 1 MG tablet Take 1 tablet (1 mg total) by mouth daily. 02/22/18  Yes Lockamy, Randi L, NP-C  lidocaine-prilocaine (EMLA) cream Apply small amount over port site and cover with plastic wrap one hour prior to appointment. 02/24/18  Yes Derek Jack, MD  morphine (MSIR) 30 MG tablet Take 1 tablet (30 mg total) by mouth every 6 (six) hours as needed for severe pain. 03/21/18  Yes Lockamy, Randi L, NP-C  ondansetron (ZOFRAN ODT) 4 MG disintegrating tablet Take 1 tablet (4 mg total) by mouth every 8 (eight) hours as needed for nausea or vomiting. 03/18/18  Yes Derek Jack, MD  PEMBROLIZUMAB IV Inject into the vein.   Yes [provider]  scopolamine (TRANSDERM-SCOP) 1 MG/3DAYS Place 1 patch (1.5 mg total) onto the skin every 3 (three) days. 02/24/18  Yes Derek Jack, MD   Allergies  Allergen Reactions  . Hydrocodone Hives   Review of Systems  Unable to perform ROS: Acuity of condition   Physical Exam Vitals signs and nursing note reviewed.  Constitutional:      Appearance: He is cachectic. He is ill-appearing.    HENT:     Head: Normocephalic and atraumatic.  Pulmonary:     Effort: No tachypnea, accessory muscle usage or respiratory distress.  Skin:    General: Skin is warm and dry.  Coloration: Skin is pale.  Neurological:     Comments: drowsy  Psychiatric:        Attention and Perception: He is inattentive.        Speech: He is noncommunicative.    Vital Signs: BP 107/74 (BP Location: Left Arm)   Pulse (!) 108   Temp (!) 97.5 F (36.4 C) (Oral)   Resp 16   Ht 5\' 11"  (1.803 m)   Wt 70 kg   SpO2 100%   BMI 21.52 kg/m  Pain Scale: 0-10   Pain Score: Asleep   SpO2: SpO2: 100 % O2 Device:SpO2: 100 % O2 Flow Rate: .   IO: Intake/output summary:   Intake/Output Summary (Last 24 hours) at 04/21/2018 1629 Last data filed at 04/21/2018 1100 Gross per 24 hour  Intake -  Output 1250 ml  Net -1250 ml    LBM: Last BM Date: 04/19/18 Baseline Weight: Weight: 70 kg Most recent weight: Weight: 70 kg     Palliative Assessment/Data: PPS 30%   Flowsheet Rows     Most Recent Value  Intake Tab  Referral Department  Hospitalist  Unit at Time of Referral  Med/Surg Unit  Palliative Care Primary Diagnosis  Cancer  Palliative Care Type  New Palliative care  Date first seen by Palliative Care  04/21/18  Clinical Assessment  Palliative Performance Scale Score  30%  Psychosocial & Spiritual Assessment  Palliative Care Outcomes  Patient/Family meeting held?  Yes  Who was at the meeting?  wife, daughter  Palliative Care Outcomes  Provided end of life care assistance, Provided psychosocial or spiritual support, ACP counseling assistance      Time In: 1100 Time Out: 1210 Time Total: 70 Greater than 50%  of this time was spent counseling and coordinating care related to the above assessment and plan.  Signed by:  Ihor Dow, FNP-C Palliative Medicine Team  Phone: [phone] Fax: [phone]   Please contact Palliative Medicine Team phone at [phone] for questions and  concerns.  For individual provider: See Shea Evans

## 2018-04-21 NOTE — Progress Notes (Signed)
Informed Dr. Redmond Pulling with General Surgery that two attempts were made to place NGT unsuccessfully. Per MD ok to leave out NGT at this time. Dr. Starla Link Informed of MD's decision.

## 2018-04-21 NOTE — Progress Notes (Signed)
Patient family arrived to bedside explained procedure. In detail to family and why it is necessary.

## 2018-04-21 NOTE — Consult Note (Addendum)
[place] Nurse wound consult note Reason for Consult: Consult requested for sacrum/buttocks.  Pt has pink dry scar tissue in patchy areas, and one remaining area of healing full thickness wound 3X.2X.2cm, red and dry. Family at bedside states pt recovered from necrotizing fasciitis and previous surgery to this location. Dressing procedure/placement/frequency: Foam dressing to protect from further injury and promote healing, discussed plan of care with patient and family members at the bedside. Please re-consult if further assistance is needed.  Thank-you,  Julien Girt MSN, [place], [place], [place], [place]

## 2018-04-21 NOTE — Progress Notes (Signed)
Informed Dr. Starla Link that patient has 735ml in bladder and no urge to use the bathroom. Per MD In and Out cath patient. Informed MD that patient has thrown up 3 times this morning and the compazine did not help. MD to place orders for Phenergan.

## 2018-04-21 NOTE — Care Management Note (Signed)
Case Management Note  Patient Details  Name: Ian Roberts MRN: [id number] Date of Birth: Jul 27, 1960  Subjective/Objective:       Presented with N/V, fatigue, poor po intake.Hx of metastatic left lung adenocarcinoma with metastases to the brain, liver and shoulder. From home with wife. PTA independent with ADL's , no DME usage.  Rishan Oyama (Spouse) Juanito Doom (Daughter)    [phone]     PCP: Stoney Bang  Action/Plan: PT,OT, palliative consults pending.... NCM following for TOC needs.  Expected Discharge Date:                  Expected Discharge Plan:     In-House Referral:     Discharge planning Services  CM Consult  Post Acute Care Choice:    Choice offered to:     DME Arranged:    DME Agency:     HH Arranged:    HH Agency:     Status of Service:  In process, will continue to follow  If discussed at Long Length of Stay Meetings, dates discussed:    Additional Comments:  Sharin Mons, RN 04/21/2018, 1:13 PM

## 2018-04-21 NOTE — Progress Notes (Signed)
Sw[facility] Nurses attempted to place NGT per mother and patient want to wait for wife and daughter to be at bedside before placement.

## 2018-04-21 NOTE — Progress Notes (Signed)
Patient ID: Ian Roberts, male   DOB: [birth year], 58 y.o.   MRN: [id number]  PROGRESS NOTE    Ian Roberts  GUY:[id number] DOB: 11/10/1960 DOA: 04/20/2018 PCP: Neale Burly, MD   Brief Narrative:  58 year old male with history of metastatic left lung adenocarcinoma with metastases to the brain, liver and shoulder presented with dehydration, vomiting, fatigue and poor oral intake.  He was supposed to have PEG tube placement as an outpatient on 04/20/2018 but got admitted because of severe dehydration and hypokalemia.  He was started on IV fluids.  Assessment & Plan:   Active Problems:   Hypokalemia  Intractable nausea and vomiting -Patient is still vomiting.  Will change Zofran to around-the-clock.  Use Compazine and Phenergan as needed. -We will get x-ray of abdomen.  If persistently vomits, might need further imaging studies  Dehydration -From poor oral intake.  Continue IV fluids  Hypokalemia -Replaced.  Improved.  Repeat a.m. labs  Failure to thrive with decreased p.o. intake -From metastatic cancer -Patient was scheduled for PEG tube placement on 04/20/2018 as an outpatient but was postponed due to dehydration and hypokalemia -continue IV fluids for now  Metastatic lung cancer with metastases to brain, liver and shoulder -Currently on chemotherapy as an outpatient and follows up with oncology at [facility] -Overall prognosis is very poor.  Palliative care consulted.  Chronic pain -Continue IV Dilaudid for now.  Hypertension -Blood pressure on hold for now.  ACE inhibitor on hold   DVT prophylaxis: Lovenox Code Status: Full Family Communication: None at bedside Disposition Plan: Depends on clinical outcome  Consultants: Palliative care Procedures: None  Antimicrobials: None   Subjective: Patient seen and examined at bedside.  He is a poor historian.  Is still nauseous and vomiting.  No worsening abdominal pain.  Objective: Vitals:   04/20/18 2032  04/20/18 2102 04/21/18 0151 04/21/18 0431  BP: 92/65 98/71 100/67 95/63  Pulse: 88  97 (!) 102  Resp: 17   18  Temp: 97.6 F (36.4 C)   98.2 F (36.8 C)  TempSrc: Oral   Oral  SpO2: 99%   99%  Weight:      Height:        Intake/Output Summary (Last 24 hours) at 04/21/2018 1032 Last data filed at 04/21/2018 0718 Gross per 24 hour  Intake 1406.16 ml  Output 500 ml  Net 906.16 ml   Filed Weights   04/20/18 1105  Weight: 70 kg    Examination:  General exam: Appears chronically ill.  Currently nauseous and vomiting.  Poor historian Respiratory system: Bilateral decreased breath sounds at bases Cardiovascular system: S1 & S2 heard, intermittently tachycardic Gastrointestinal system: Abdomen is nondistended, soft and mildly tender in the epigastric region. Normal bowel sounds heard. Extremities: No cyanosis, clubbing, edema    Data Reviewed: I have personally reviewed following labs and imaging studies  CBC: Recent Labs  Lab 04/20/18 1008 04/21/18 0353  WBC 12.4* 9.1  HGB 8.0* 7.6*  HCT 24.6* 23.2*  MCV 86.9 88.5  PLT 521* 563*   Basic Metabolic Panel: Recent Labs  Lab 04/20/18 1008 04/20/18 1925 04/21/18 0353  NA 127*  --  132*  K 2.7* 3.4* 3.5  CL 90*  --  99  CO2 19*  --  19*  GLUCOSE 182*  --  84  BUN 17  --  18  CREATININE 0.97  --  0.90  CALCIUM 8.0*  --  7.6*   GFR: Estimated Creatinine Clearance: 90.7  mL/min (by C-G formula based on SCr of 0.9 mg/dL). Liver Function Tests: Recent Labs  Lab 04/20/18 1008  AST 17  ALT 15  ALKPHOS 231*  BILITOT 2.1*  PROT 7.4  ALBUMIN 2.1*   No results for input(s): LIPASE, AMYLASE in the last 168 hours. No results for input(s): AMMONIA in the last 168 hours. Coagulation Profile: Recent Labs  Lab 04/20/18 1035  INR 1.47   Cardiac Enzymes: No results for input(s): CKTOTAL, CKMB, CKMBINDEX, TROPONINI in the last 168 hours. BNP (last 3 results) No results for input(s): PROBNP in the last 8760  hours. HbA1C: No results for input(s): HGBA1C in the last 72 hours. CBG: No results for input(s): GLUCAP in the last 168 hours. Lipid Profile: No results for input(s): CHOL, HDL, LDLCALC, TRIG, CHOLHDL, LDLDIRECT in the last 72 hours. Thyroid Function Tests: No results for input(s): TSH, T4TOTAL, FREET4, T3FREE, THYROIDAB in the last 72 hours. Anemia Panel: No results for input(s): VITAMINB12, FOLATE, FERRITIN, TIBC, IRON, RETICCTPCT in the last 72 hours. Sepsis Labs: No results for input(s): PROCALCITON, LATICACIDVEN in the last 168 hours.  No results found for this or any previous visit (from the past 240 hour(s)).       Radiology Studies: No results found.      Scheduled Meds: . amLODipine  10 mg Oral Daily  . dexamethasone  4 mg Oral Daily  . dronabinol  2.5 mg Oral BID AC  . enoxaparin (LOVENOX) injection  40 mg Subcutaneous Q24H  . feeding supplement  1 Container Oral TID BM  . folic acid  1 mg Oral Daily  . Influenza vac split quadrivalent PF  0.5 mL Intramuscular Tomorrow-1000  . mouth rinse  15 mL Mouth Rinse BID  . ondansetron (ZOFRAN) IV  4 mg Intravenous Q6H   Or  . ondansetron  4 mg Oral Q6H  . pantoprazole  40 mg Oral Daily  . [START ON 04/23/2018] scopolamine  1 patch Transdermal Q72H  . simvastatin  20 mg Oral Daily  . sodium chloride flush  10-40 mL Intracatheter Q12H   Continuous Infusions: . sodium chloride 100 mL/hr at 04/21/18 0145     LOS: 0 days        Aline August, MD [facility] Pager [phone]  If 7PM-7AM, please contact night-coverage [email or web address] Password [facility] 04/21/2018, 10:32 AM

## 2018-04-21 NOTE — Consult Note (Signed)
[facility] Surgery Consult Note  Ian Roberts 1960/04/08  [id number].    Requesting MD: Starla Link Chief Complaint/Reason for Consult: sbo   HPI:  Patient is a 58 year old male who presented to [facility] 1/29 with dehydration and weakness. He was scheduled to have IR G-tube placed 1/29 but was sent to the hospital because of above. 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 He has lost a significant amount of weight prior to diagnosis with metastatic cancer 01/2018. He has had poor PO intake with no appetite. He has been undergoing chemo and radiation. Did have some nausea and vomiting. Overall patient has been doing poorly. He continued to have nausea and vomiting overnight despite IV antiemetics. An abdominal plan film was done that revealed numerus distended loops of small bowel questionable for obstruction vs ileus.  Denied fever, chills, chest pain, SOB, abdominal pain, diarrhea, or urinary symptoms. PMH otherwise significant for STEMI in 2010 s/p stent, hx of CVA, CAD, HTN. He does not appear to be on any chronic blood thinning medications. No past abdominal surgery.   ROS: Review of Systems  Constitutional: Negative for chills and fever.  Respiratory: Negative for shortness of breath.   Cardiovascular: Negative for chest pain.  Gastrointestinal: Positive for nausea and vomiting. Negative for abdominal pain.  Genitourinary: Negative for dysuria.  Neurological: Positive for weakness.  All other systems reviewed and are negative.   Family History  Problem Relation Age of Onset  . Diabetes Mother   . Hypertension Mother   . Lung cancer Father     Past Medical History:  Diagnosis Date  . Bone lesion    right shoulder found on x-ray  . CAD (coronary artery disease)   . Cancer ([place])   . Dyslipidemia   . Hypertension   . Hypokalemia   . Necrotizing fasciitis ([place])   . Rotator cuff tear   . ST elevation myocardial  infarction (STEMI) of inferior wall (H[facility])    Assocaited with paroxysmal atrial fibrillation 05/13/2008; Xience drug eluting stent  . Stroke [facility])     Past Surgical History:  Procedure Laterality Date  . CORONARY ANGIOPLASTY WITH STENT PLACEMENT    . CYST EXCISION    . IR IMAGING GUIDED PORT INSERTION  02/11/2018  . TONSILLECTOMY      Social History:  reports that he has been smoking cigarettes. He has a 20.50 pack-year smoking history. He has never used smokeless tobacco. He reports that he does not drink alcohol or use drugs.  Allergies:  Allergies  Allergen Reactions  . Hydrocodone Hives    Medications Prior to Admission  Medication Sig Dispense Refill  . acetaminophen (TYLENOL) 500 MG tablet Take 1,000 mg by mouth every 6 (six) hours as needed for mild pain.    Marland Kitchen CARBOPLATIN IV Inject into the vein.    Marland Kitchen dexamethasone (DECADRON) 4 MG tablet Take 1 tablet (4 mg total) by mouth daily. 30 tablet 0  . famotidine (PEPCID) 20 MG tablet Take 20 mg by mouth daily as needed for heartburn or indigestion.    . folic acid (FOLVITE) 1 MG tablet Take 1 tablet (1 mg total) by mouth daily. 30 tablet 2  . lidocaine-prilocaine (EMLA) cream Apply small amount over port site and cover with plastic wrap one hour prior to appointment. 30 g 3  . morphine (MSIR) 30 MG tablet Take 1 tablet (30 mg total) by mouth every 6 (six) hours as needed for  severe pain. 60 tablet 0  . ondansetron (ZOFRAN ODT) 4 MG disintegrating tablet Take 1 tablet (4 mg total) by mouth every 8 (eight) hours as needed for nausea or vomiting. 30 tablet 3  . PEMBROLIZUMAB IV Inject into the vein.    Marland Kitchen scopolamine (TRANSDERM-SCOP) 1 MG/3DAYS Place 1 patch (1.5 mg total) onto the skin every 3 (three) days. 10 patch 2    Blood pressure 95/63, pulse (!) 102, temperature 98.2 F (36.8 C), temperature source Oral, resp. rate 18, height 5\' 11"  (1.803 m), weight 70 kg, SpO2 99 %. Physical Exam: General: Chronically ill appearing white  male who is WD, lying in bed in NAD HEENT: head is normocephalic, atraumatic.  Sclera are noninjected.  PERRL.  Ears and nose without any masses or lesions.  Mouth is pink and moist Heart: regular, rate, and rhythm.  Normal s1,s2. No obvious murmurs, gallops, or rubs noted.  Palpable radial and pedal pulses bilaterally Lungs: CTAB, no wheezes, rhonchi, or rales noted.  Respiratory effort nonlabored Abd: soft, non-tender without r/r/g, mild distension, hypoactive BS, no masses, hernias, or organomegaly MS: all 4 extremities are symmetrical with no cyanosis, clubbing, or edema. Skin: warm and dry with no masses, lesions, or rashes Psych: A&Ox3 with an appropriate affect.   Results for orders placed or performed during the hospital encounter of 04/20/18 (from the past 48 hour(s))  HIV antibody (Routine Testing)     Status: None   Collection Time: 04/20/18  1:42 PM  Result Value Ref Range   HIV Screen 4th Generation wRfx Non Reactive Non Reactive    Comment: (NOTE) Performed At: [facility] [place], [place] [id number] Rush Farmer MD CX:[id number]   Potassium     Status: Abnormal   Collection Time: 04/20/18  7:25 PM  Result Value Ref Range   Potassium 3.4 (L) 3.5 - 5.1 mmol/L    Comment: NO VISIBLE HEMOLYSIS Performed at [facility], [place] [address], [place], [place] [address]   Basic metabolic panel     Status: Abnormal   Collection Time: 04/21/18  3:53 AM  Result Value Ref Range   Sodium 132 (L) 135 - 145 mmol/L   Potassium 3.5 3.5 - 5.1 mmol/L   Chloride 99 98 - 111 mmol/L   CO2 19 (L) 22 - 32 mmol/L   Glucose, Bld 84 70 - 99 mg/dL   BUN 18 6 - 20 mg/dL   Creatinine, Ser 0.90 0.61 - 1.24 mg/dL   Calcium 7.6 (L) 8.9 - 10.3 mg/dL   GFR calc non Af Amer >60 >60 mL/min   GFR calc Af Amer >60 >60 mL/min   Anion gap 14 5 - 15    Comment: Performed at [facility], [place] [address], [place], [place] [address]  CBC     Status: Abnormal    Collection Time: 04/21/18  3:53 AM  Result Value Ref Range   WBC 9.1 4.0 - 10.5 K/uL   RBC 2.62 (L) 4.22 - 5.81 MIL/uL   Hemoglobin 7.6 (L) 13.0 - 17.0 g/dL   HCT 23.2 (L) 39.0 - 52.0 %   MCV 88.5 80.0 - 100.0 fL   MCH 29.0 26.0 - 34.0 pg   MCHC 32.8 30.0 - 36.0 g/dL   RDW 21.6 (H) 11.5 - 15.5 %   Platelets 466 (H) 150 - 400 K/uL   nRBC 0.0 0.0 - 0.2 %    Comment: Performed at [facility], [place] [address]  [place], [place] [address]   Dg Abd 2 Views  Result Date: 04/21/2018 CLINICAL DATA:  Persistent vomiting, weakness, history of coronary artery disease post MI, metastatic lung cancer, hypertension, stroke EXAM: ABDOMEN - 2 VIEW COMPARISON:  CT abdomen and pelvis 03/09/2018 FINDINGS: Numerous air-filled distended loops of small bowel throughout abdomen. Small amounts of stool within colon and rectum. Paucity of colonic gas. No definite bowel wall thickening or free air. Gaseous distention of stomach. No acute osseous findings. IMPRESSION: Numerous distended loops of small bowel throughout the abdomen with paucity of colonic gas and mild scattered stool in colon, question small-bowel obstruction versus ileus. No evidence of perforation. Electronically Signed   By: Lavonia Dana M.D.   On: 04/21/2018 12:55      Assessment/Plan HTN HLD CAD, Hx of STEMI in 2010 s/p stent placement Hx of CVA Metastatic adenocarcinoma of lung - mets to brain, liver, right clavicle  Severe protein calorie malnutrition/Failure to thrive SBO - ?malignant given diagnosis of metastatic disease - abdominal film with distended loops of small bowel with colonic gas and some scattered stool in colon  - recommend NGT for decompression and continued resuscitation with IVF and electrolyte replacement - recommend IR consult for palliative G-tube, patient was scheduled for this as an outpatient but this could be done while admitted - Discussed with patient, his wife and his mother - palliative consult pending -  no indications for acute surgical intervention, call as needed if we can be of assistance  Alferd Apa, [facility] Surgery 04/21/2017, 2:52PM [phone] Pager:

## 2018-04-21 NOTE — Progress Notes (Signed)
Two attempts made to place NGT. Unable to get past nares with both attempts. Surgery PA paged.

## 2018-04-22 ENCOUNTER — Inpatient Hospital Stay (HOSPITAL_COMMUNITY): Payer: Medicaid Other

## 2018-04-22 ENCOUNTER — Encounter (HOSPITAL_COMMUNITY): Payer: Self-pay

## 2018-04-22 DIAGNOSIS — K56609 Unspecified intestinal obstruction, unspecified as to partial versus complete obstruction: Secondary | ICD-10-CM

## 2018-04-22 LAB — COMPREHENSIVE METABOLIC PANEL
ALT: 10 U/L (ref 0–44)
AST: 11 U/L — AB (ref 15–41)
Albumin: 1.8 g/dL — ABNORMAL LOW (ref 3.5–5.0)
Alkaline Phosphatase: 207 U/L — ABNORMAL HIGH (ref 38–126)
Anion gap: 14 (ref 5–15)
BUN: 19 mg/dL (ref 6–20)
CO2: 19 mmol/L — ABNORMAL LOW (ref 22–32)
Calcium: 7.6 mg/dL — ABNORMAL LOW (ref 8.9–10.3)
Chloride: 106 mmol/L (ref 98–111)
Creatinine, Ser: 0.86 mg/dL (ref 0.61–1.24)
GFR calc Af Amer: >60 mL/min (ref >60)
Glucose, Bld: 150 mg/dL — ABNORMAL HIGH (ref 70–99)
Potassium: 2.8 mmol/L — ABNORMAL LOW (ref 3.5–5.1)
Sodium: 139 mmol/L (ref 135–145)
Total Bilirubin: 1.9 mg/dL — ABNORMAL HIGH (ref 0.3–1.2)
Total Protein: 6.1 g/dL — ABNORMAL LOW (ref 6.5–8.1)

## 2018-04-22 LAB — CBC WITH DIFFERENTIAL/PLATELET
Abs Immature Granulocytes: 0.11 10*3/uL — ABNORMAL HIGH (ref 0.00–0.07)
Basophils Absolute: 0 10*3/uL (ref 0.0–0.1)
Basophils Relative: 0 %
Eosinophils Absolute: 0 10*3/uL (ref 0.0–0.5)
Eosinophils Relative: 0 %
HCT: 22.2 % — ABNORMAL LOW (ref 39.0–52.0)
Hemoglobin: 7 g/dL — ABNORMAL LOW (ref 13.0–17.0)
Immature Granulocytes: 1 %
Lymphocytes Relative: 4 %
Lymphs Abs: 0.4 10*3/uL — ABNORMAL LOW (ref 0.7–4.0)
MCH: 29 pg (ref 26.0–34.0)
MCHC: 31.5 g/dL (ref 30.0–36.0)
MCV: 92.1 fL (ref 80.0–100.0)
Monocytes Absolute: 0.7 10*3/uL (ref 0.1–1.0)
Monocytes Relative: 6 %
Neutro Abs: 10.2 10*3/uL — ABNORMAL HIGH (ref 1.7–7.7)
Neutrophils Relative %: 89 %
Platelets: 425 10*3/uL — ABNORMAL HIGH (ref 150–400)
RBC: 2.41 MIL/uL — ABNORMAL LOW (ref 4.22–5.81)
RDW: 22.5 % — ABNORMAL HIGH (ref 11.5–15.5)
WBC: 11.5 10*3/uL — AB (ref 4.0–10.5)
nRBC: 0 % (ref 0.0–0.2)

## 2018-04-22 LAB — MAGNESIUM: Magnesium: 1.7 mg/dL (ref 1.7–2.4)

## 2018-04-22 MED ORDER — POTASSIUM CHLORIDE 10 MEQ/100ML IV SOLN
10.0000 meq | INTRAVENOUS | Status: AC
  Administered 2018-04-22 (×2): 10 meq via INTRAVENOUS
  Filled 2018-04-22 (×5): qty 100

## 2018-04-22 MED ORDER — POTASSIUM CHLORIDE 10 MEQ/100ML IV SOLN
10.0000 meq | INTRAVENOUS | Status: AC
  Administered 2018-04-22 (×3): 10 meq via INTRAVENOUS
  Filled 2018-04-22: qty 100

## 2018-04-22 MED ORDER — POTASSIUM CHLORIDE IN NACL 40-0.9 MEQ/L-% IV SOLN
INTRAVENOUS | Status: DC
  Administered 2018-04-22 – 2018-04-23 (×3): 100 mL/h via INTRAVENOUS
  Administered 2018-04-24 (×2): 50 mL/h via INTRAVENOUS
  Filled 2018-04-22 (×6): qty 1000

## 2018-04-22 MED ORDER — SCOPOLAMINE 1 MG/3DAYS TD PT72
1.0000 | MEDICATED_PATCH | TRANSDERMAL | Status: DC
  Administered 2018-04-22 – 2018-05-01 (×4): 1.5 mg via TRANSDERMAL
  Filled 2018-04-22 (×4): qty 1

## 2018-04-22 MED ORDER — IOPAMIDOL (ISOVUE-370) INJECTION 76%
100.0000 mL | Freq: Once | INTRAVENOUS | Status: AC | PRN
  Administered 2018-04-22: 100 mL via INTRAVENOUS

## 2018-04-22 NOTE — Progress Notes (Signed)
Occupational Therapy Evaluation Patient Details Name: Ian Roberts MRN: [id number] DOB: Nov 17, 1960 Today's Date: 04/22/2018    History of Present Illness 58 year old male with history of metastatic left lung adenocarcinoma with metastases to the brain, liver and shoulder presented with dehydration, vomiting, fatigue and poor oral intake.  He was supposed to have PEG tube placement as an outpatient on 04/20/2018 but got admitted because of severe dehydration and hypokalemia.   Clinical Impression   Pt's participation very limited this session due to lethargy, pt falling asleep mid conversation requiring consistent multimodal cues to remain awake and to participate during session. Session this date focused on gathering pt's PLF from family present during session. Per family, PTA pt required assistance for ADL/IADL and was using a wheelchair for functional mobility. Pt's wife reports she was assisting pt with stand-pivot transfer from surface to wc. Pt has available assistance from wife, daughter, and mother 24/7. Will continue to follow acutely to maximize pt's independence and safety with ADL and functional mobility to allow d/c to venue listed below.     Follow Up Recommendations  Home health OT;Supervision/Assistance - 24 hour    Equipment Recommendations  None recommended by OT    Recommendations for Other Services PT consult     Precautions / Restrictions Precautions Precautions: Fall Restrictions Weight Bearing Restrictions: No      Mobility Bed Mobility               General bed mobility comments: pt declined OOB mobility secondary to lethargy  Transfers                 General transfer comment: pt declined OOB mobility secondary to lethargy     Balance                                           ADL either performed or assessed with clinical judgement   ADL Overall ADL's : Needs assistance/impaired                                        General ADL Comments: unable to thoroughly assess pt's level of functioning secondary to pt falling asleep during session     Vision Baseline Vision/History: Wears glasses Wears Glasses: At all times       Perception     Praxis      Pertinent Vitals/Pain Pain Assessment: No/denies pain     Hand Dominance Left   Extremity/Trunk Assessment Upper Extremity Assessment Upper Extremity Assessment: RUE deficits/detail RUE Deficits / Details: pt unable to use RUE; assessment limited due to pt arousal, will further assess when appropriate    Lower Extremity Assessment Lower Extremity Assessment: Defer to PT evaluation       Communication Communication Communication: No difficulties   Cognition Arousal/Alertness: Lethargic;Suspect due to medications Behavior During Therapy: Flat affect Overall Cognitive Status: Difficult to assess                                 General Comments: pt falling asleep during session, required multimodal cues to remain awake;pt able to identify family present in the room. Pt questionable for reliability with providing PLF, required correction of PLF from family members present   General Comments  pt's wife, mother, and 1 additional family member present during session    Exercises     Shoulder Instructions      Home Living Family/patient expects to be discharged to:: Private residence Living Arrangements: Spouse/significant other Available Help at Discharge: Family;Available 24 hours/day Type of Home: House Home Access: Ramped entrance;Stairs to enter Entrance Stairs-Number of Steps: 9 Entrance Stairs-Rails: None       Bathroom Shower/Tub: Tub/shower unit         Home Equipment: Hospital bed;Shower seat;Bedside commode;Walker - 2 wheels;Grab bars - tub/shower;Grab bars - toilet;Wheelchair - manual   Additional Comments: pt's family report they are having at deck built with a ramped entrace and handrails  at this time      Prior Functioning/Environment Level of Independence: Needs assistance  Gait / Transfers Assistance Needed: modA from family for transfer;utilized wheelchair;has not ambulated since Apr 07, 2018 ADL's / Homemaking Assistance Needed: family provided assistance for ADL/IADL             OT Problem List: Decreased activity tolerance;Decreased range of motion;Decreased strength;Impaired balance (sitting and/or standing);Decreased cognition;Decreased safety awareness;Impaired UE functional use      OT Treatment/Interventions: Self-care/ADL training;Therapeutic exercise;Neuromuscular education;Energy conservation;DME and/or AE instruction;Therapeutic activities;Patient/family education;Balance training    OT Goals(Current goals can be found in the care plan section) Acute Rehab OT Goals Patient Stated Goal: to walk again OT Goal Formulation: With patient/family Time For Goal Achievement: 05/06/18 Potential to Achieve Goals: Good  OT Frequency: Min 2X/week   Barriers to D/C:            Co-evaluation              AM-PAC OT "6 Clicks" Daily Activity     Outcome Measure Help from another person eating meals?: None Help from another person taking care of personal grooming?: A Little Help from another person toileting, which includes using toliet, bedpan, or urinal?: Total Help from another person bathing (including washing, rinsing, drying)?: Total Help from another person to put on and taking off regular upper body clothing?: A Lot Help from another person to put on and taking off regular lower body clothing?: Total 6 Click Score: 12   End of Session Nurse Communication: Mobility status  Activity Tolerance: Patient limited by lethargy Patient left: in bed;with call bell/phone within reach;with family/visitor present  OT Visit Diagnosis: Unsteadiness on feet (R26.81);Other abnormalities of gait and mobility (R26.89);Muscle weakness (generalized) (M62.81);Other  symptoms and signs involving cognitive function                Time: 1224-8250 OT Time Calculation (min): 13 min Charges:  OT General Charges $OT Visit: 1 Visit OT Evaluation $OT Eval Moderate Complexity: Brenda OTR/L Acute Rehabilitation Services Office: [place] 04/22/2018, 12:04 PM

## 2018-04-22 NOTE — Progress Notes (Signed)
Daily Progress Note   Patient Name: Ian Roberts       Date: 04/22/2018 DOB: 1960/11/11  Age: 58 y.o. MRN#: [id number] Attending Physician: Aline August, MD Primary Care Physician: Neale Burly, MD Admit Date: 04/20/2018  Reason for Consultation/Follow-up: Establishing goals of care  Subjective: Patient wakes to voice. Alert and oriented. Comfortable without nausea. Minimally engages in conversation.  GOC:   Wife Advertising copywriter) and daughter Veterinary surgeon) at bedside for [employer].  I introduced Palliative Medicine as specialized medical care for people living with serious illness. It focuses on providing relief from the symptoms and stress of a serious illness. The goal is to improve quality of life for both the patient and the family.  We discussed a brief life review of the patient 04/21/18. See note.   Discussed events leading up to hospitalization and course of hospitalization including diagnoses and interventions. [place] speak of him looking better today. "Always better after IV fluids."   Cindy and [name] share that he has completed 10 cycles of radiations, 2 rounds of chemotherapy, and was recently started on immunotherapy on 03/31/18. Cindy tearful. They share their frustrations that repeat scans have not been performed since radiation and they were planning to discuss this with oncologist at appointment yesterday, but he was admitted. They are interested in abdominal CT to determine if possible bowel obstruction is from worsening metastatic disease.  We discussed plan to go to IR for g tube placement. At this point (with possible bowel obstruction) explained that g tube indication is to help with symptoms and decompression. Explained that we will not be able to feed him through  tube if bowel obstruction does not resolve.   Advanced directives, concepts specific to code status, and artifical feeding and hydration were discussed. Mr. Nelles does not have a documented living will or POA. Encouraged [name] and [name] to start considering limitations to care with fear that resuscitative efforts at EOL will cause more harm and suffering with underlying metastatic cancer and frailty.   They are not ready to make decisions until repeat scans are performed and they have more answers. Hard Choices copy given for review. PMT contact information given. Emotional/spiritual support provided.    Length of Stay: 1  Current Medications: Scheduled Meds:  . amLODipine  10 mg Oral Daily  .  dexamethasone  4 mg Oral Daily  . dronabinol  2.5 mg Oral BID AC  . enoxaparin (LOVENOX) injection  40 mg Subcutaneous Q24H  . folic acid  1 mg Oral Daily  . mouth rinse  15 mL Mouth Rinse BID  . ondansetron (ZOFRAN) IV  4 mg Intravenous Q6H   Or  . ondansetron  4 mg Oral Q6H  . pantoprazole (PROTONIX) IV  40 mg Intravenous Q24H  . scopolamine  1 patch Transdermal Q72H  . simvastatin  20 mg Oral Daily  . sodium chloride flush  10-40 mL Intracatheter Q12H    Continuous Infusions: . 0.9 % NaCl with KCl 40 mEq / L 100 mL/hr (04/22/18 0843)  . potassium chloride 10 mEq (04/22/18 0827)    PRN Meds: acetaminophen **OR** acetaminophen, HYDROmorphone (DILAUDID) injection, ondansetron, oxyCODONE, prochlorperazine, promethazine, sodium chloride flush, zolpidem  Physical Exam Vitals signs and nursing note reviewed.  Constitutional:      Appearance: He is cachectic. He is ill-appearing.  HENT:     Head: Normocephalic and atraumatic.  Cardiovascular:     Rate and Rhythm: Normal rate.  Pulmonary:     Effort: No tachypnea, accessory muscle usage or respiratory distress.  Abdominal:     Tenderness: There is no abdominal tenderness.  Skin:    General: Skin is warm and dry.     Coloration: Skin  is pale.  Neurological:     Mental Status: He is alert, oriented to person, place, and time and easily aroused.     Comments: Drowsy. Minimally engaged in conversation.   Psychiatric:        Attention and Perception: He is inattentive.        Speech: Speech is delayed.            Vital Signs: BP 90/72 (BP Location: Left Arm)   Pulse (!) 108   Temp (!) 97.4 F (36.3 C) (Oral)   Resp 18   Ht 5\' 11"  (1.803 m)   Wt 70 kg   SpO2 98%   BMI 21.52 kg/m  SpO2: SpO2: 98 % O2 Device: O2 Device: Room Air O2 Flow Rate:    Intake/output summary:   Intake/Output Summary (Last 24 hours) at 04/22/2018 1020 Last data filed at 04/22/2018 5701 Gross per 24 hour  Intake -  Output 1250 ml  Net -1250 ml   LBM: Last BM Date: 04/19/18 Baseline Weight: Weight: 70 kg Most recent weight: Weight: 70 kg       Palliative Assessment/Data: PPS 40%   Flowsheet Rows     Most Recent Value  Intake Tab  Referral Department  Hospitalist  Unit at Time of Referral  Med/Surg Unit  Palliative Care Primary Diagnosis  Cancer  Palliative Care Type  New Palliative care  Date first seen by Palliative Care  04/21/18  Clinical Assessment  Palliative Performance Scale Score  40%  Psychosocial & Spiritual Assessment  Palliative Care Outcomes  Patient/Family meeting held?  Yes  Who was at the meeting?  patient, wife, daughter  Palliative Care Outcomes  Clarified goals of care, Provided end of life care assistance, Provided psychosocial or spiritual support, ACP counseling assistance, Improved pain interventions, Improved non-pain symptom therapy      Patient Active Problem List   Diagnosis Date Noted  . Protein-calorie malnutrition, severe 04/21/2018  . Pressure injury of skin 04/21/2018  . Palliative care by specialist   . Weakness   . Dehydration   . Abdominal pain   . Hypokalemia 04/20/2018  .  Goals of care, counseling/discussion 02/22/2018  . Primary malignant neoplasm of left lung metastatic to  other site ([place]) 01/31/2018  . DYSLIPIDEMIA 06/05/2008  . HYPERTENSION, BENIGN ESSENTIAL 06/05/2008  . ACUT MI OTH INF WALL SUBSQT EPIS CARE 06/05/2008  . CORONARY ATHEROSCLEROSIS NATIVE CORONARY ARTERY 06/05/2008    Palliative Care Assessment & Plan   Patient Profile: 58 y.o. 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 IVF initiated. Diagnosed with metastatic lung cancer in November 2019 s/p two rounds of chemotherapy and whole brain radiation. This admission, patient with intractable nausea and vomiting. Abdominal xray pending this afternoon. Palliative medicine consultation for goals of care.   Assessment: Metastatic lung cancer to brain, liver, bone, right adrenal gland Nausea and vomiting Chronic cancer related pain Dehydration Hypokalemia Failure to thrive  Recommendations/Plan:  Continue FULL code/FULL scope treatment. Family requesting CT abdomen to determine if bowel obstruction is related to metastases. Family also speaks of frustrations that repeat MRI brain was not completed after course of radiation. Family is not ready to make decisions regarding [place] until they have more information. Discussed with Dr. Starla Link.   Patient/family wish to pursue g tube placement via IR.   Family does understand that his cancer is not curable and oncology interventions are palliative in nature.  Briefly introduced advance directives and consideration to limitations of care (resuscitation/life support) with underlying metastatic cancer and frailty. Hard Choices copy given for review.   Goals of Care and Additional Recommendations:  Limitations on Scope of Treatment: Full Scope Treatment  Code Status: FULL   Code Status Orders  (From admission, onward)          Start     Ordered   04/20/18 1342  Full code  Continuous     04/20/18 1341        Code Status History    This patient has a current code status but no historical code status.       Prognosis:   Poor prognosis with metastatic left lung adenocarcinoma, ileus vs. SBO, and declining functional/nutritional status.   Discharge Planning:  To Be Determined  Care plan was discussed with Dr. Starla Link, RN, patient, wife, daughter  Thank you for allowing the Palliative Medicine Team to assist in the care of this patient.   Time In: 0905 Time Out: 1015 Total Time 70 Prolonged Time Billed  yes       Greater than 50%  of this time was spent counseling and coordinating care related to the above assessment and plan.  Ihor Dow, FNP-C Palliative Medicine Team  Phone: [phone] Fax: [phone]  Please contact Palliative Medicine Team phone at [phone] for questions and concerns.

## 2018-04-22 NOTE — Progress Notes (Signed)
Patient ID: Ian Roberts, male   DOB: 08-May-1960, 58 y.o.   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  He was supposed to have PEG tube placement as an outpatient on 04/20/2018 but got admitted because of severe dehydration and hypokalemia.  He was started on IV fluids. He was found to have bowel obstruction versus ileus, general surgery was consulted palliative care was also consulted.  Assessment & Plan:    Active Problems:   Primary malignant neoplasm of lung metastatic to other site [facility])   Hypokalemia   Protein-calorie malnutrition, severe   Pressure injury of skin   Palliative care by specialist   Weakness   Dehydration   Abdominal pain  Small bowel obstruction versus ileus presenting with intractable nausea and vomiting -Vomiting has improved.  Continue Zofran around-the-clock for today as well.  Use Compazine and Phenergan as needed. -General surgery following: Unable to place NG tube.    Dehydration -From poor oral intake.  Continue IV fluids  Hypokalemia -Replace. Repeat a.m. labs  Failure to thrive with decreased p.o. intake -From metastatic cancer -Patient was scheduled for PEG tube placement on 04/20/2018 as an outpatient but was postponed due to dehydration and hypokalemia. Will consult IR for palliative G-tube -continue IV fluids for now  Metastatic lung cancer with metastases to brain, liver and shoulder -Currently on chemotherapy as an outpatient and follows up with oncology at [facility] -Overall prognosis is very poor.  Palliative care following.  Will get CAT scan of the abdomen and chest to follow-up on the progression of the cancer.  Patient remains full code for  now.  Chronic pain -Continue IV Dilaudid for now.  Hypertension -Blood pressure on hold for now.  ACE inhibitor on hold   DVT prophylaxis: Lovenox Code Status: Full Family Communication: None at bedside Disposition Plan: Depends on clinical outcome  Consultants: Palliative care Procedures: None  Antimicrobials: None   Subjective: Patient seen and examined at bedside.  He is a poor historian.  He feels slightly better and has not vomited since yesterday.  Wife is present at bedside.  No overnight fever or worsening abdominal pain.    Objective: Vitals:   04/21/18 1500 04/21/18 2111 04/22/18 0622 04/22/18 0852  BP: 107/74 106/72 120/77 90/72  Pulse: (!) 108 (!) 117 (!) 108 (!) 108  Resp: 16 18    Temp: (!) 97.5 F (36.4 C) 98.9 F (37.2 C) (!) 97.4 F (36.3 C)   TempSrc: Oral Oral Oral   SpO2: 100% 98%    Weight:      Height:        Intake/Output Summary (Last 24 hours) at 04/22/2018 1051 Last data filed at 04/22/2018 4696 Gross per 24 hour  Intake -  Output 1250 ml  Net -1250 ml   Filed Weights   04/20/18 1105  Weight: 70 kg    Examination:  General exam: Appears chronically ill.  Poor historian.  No acute distress Respiratory system: Bilateral decreased breath sounds at bases, scattered crackles Cardiovascular system: S1 & S2 heard, tachycardic Gastrointestinal system: Abdomen is nondistended, soft and mildly tender in the epigastric region.  Bowel sounds sluggish Extremities: No cyanosis, edema    Data Reviewed: I have personally reviewed  following labs and imaging studies  CBC: Recent Labs  Lab 04/20/18 1008 04/21/18 0353 04/22/18 0414  WBC 12.4* 9.1 11.5*  NEUTROABS  --   --  10.2*  HGB 8.0* 7.6* 7.0*  HCT 24.6* 23.2* 22.2*  MCV 86.9 88.5 92.1  PLT 521* 466* 824*   Basic Metabolic Panel: Recent Labs  Lab 04/20/18 1008 04/20/18 1925 04/21/18 0353 04/22/18 0414  NA 127*  --  132* 139  K 2.7* 3.4* 3.5 2.8*  CL 90*  --  99 106  CO2 19*   --  19* 19*  GLUCOSE 182*  --  84 150*  BUN 17  --  18 19  CREATININE 0.97  --  0.90 0.86  CALCIUM 8.0*  --  7.6* 7.6*  MG  --   --   --  1.7   GFR: Estimated Creatinine Clearance: 95 mL/min (by C-G formula based on SCr of 0.86 mg/dL). Liver Function Tests: Recent Labs  Lab 04/20/18 1008 04/22/18 0414  AST 17 11*  ALT 15 10  ALKPHOS 231* 207*  BILITOT 2.1* 1.9*  PROT 7.4 6.1*  ALBUMIN 2.1* 1.8*   No results for input(s): LIPASE, AMYLASE in the last 168 hours. No results for input(s): AMMONIA in the last 168 hours. Coagulation Profile: Recent Labs  Lab 04/20/18 1035  INR 1.47   Cardiac Enzymes: No results for input(s): CKTOTAL, CKMB, CKMBINDEX, TROPONINI in the last 168 hours. BNP (last 3 results) No results for input(s): PROBNP in the last 8760 hours. HbA1C: No results for input(s): HGBA1C in the last 72 hours. CBG: No results for input(s): GLUCAP in the last 168 hours. Lipid Profile: No results for input(s): CHOL, HDL, LDLCALC, TRIG, CHOLHDL, LDLDIRECT in the last 72 hours. Thyroid Function Tests: No results for input(s): TSH, T4TOTAL, FREET4, T3FREE, THYROIDAB in the last 72 hours. Anemia Panel: No results for input(s): VITAMINB12, FOLATE, FERRITIN, TIBC, IRON, RETICCTPCT in the last 72 hours. Sepsis Labs: No results for input(s): PROCALCITON, LATICACIDVEN in the last 168 hours.  No results found for this or any previous visit (from the past 240 hour(s)).       Radiology Studies: Dg Abd 2 Views  Result Date: 04/21/2018 CLINICAL DATA:  Persistent vomiting, weakness, history of coronary artery disease post MI, metastatic lung cancer, hypertension, stroke EXAM: ABDOMEN - 2 VIEW COMPARISON:  CT abdomen and pelvis 03/09/2018 FINDINGS: Numerous air-filled distended loops of small bowel throughout abdomen. Small amounts of stool within colon and rectum. Paucity of colonic gas. No definite bowel wall thickening or free air. Gaseous distention of stomach. No acute  osseous findings. IMPRESSION: Numerous distended loops of small bowel throughout the abdomen with paucity of colonic gas and mild scattered stool in colon, question small-bowel obstruction versus ileus. No evidence of perforation. Electronically Signed   By: Lavonia Dana M.D.   On: 04/21/2018 12:55        Scheduled Meds: . amLODipine  10 mg Oral Daily  . dexamethasone  4 mg Oral Daily  . dronabinol  2.5 mg Oral BID AC  . enoxaparin (LOVENOX) injection  40 mg Subcutaneous Q24H  . folic acid  1 mg Oral Daily  . mouth rinse  15 mL Mouth Rinse BID  . ondansetron (ZOFRAN) IV  4 mg Intravenous Q6H   Or  . ondansetron  4 mg Oral Q6H  . pantoprazole (PROTONIX) IV  40 mg Intravenous Q24H  . scopolamine  1 patch Transdermal Q72H  . simvastatin  20 mg Oral Daily  .  sodium chloride flush  10-40 mL Intracatheter Q12H   Continuous Infusions: . 0.9 % NaCl with KCl 40 mEq / L 100 mL/hr (04/22/18 0843)  . potassium chloride 10 mEq (04/22/18 0827)     LOS: 1 day        Aline August, MD [facility] Pager [phone]  If 7PM-7AM, please contact night-coverage [email or web address] Password [facility] 04/22/2018, 10:51 AM

## 2018-04-22 NOTE — Progress Notes (Signed)
Bladder scan 495 mL urine at 0420. Paged NP for orders to I & O cath. I &O cath completed at [phone] per NP orders; 500 mL removed.

## 2018-04-22 NOTE — Consult Note (Signed)
Chief Complaint: Patient was seen in consultation today for percutaneous gastric tube placement Chief Complaint  Patient presents with  . Fatigue   at the request of Dr Cyndi Bender  Supervising Physician: Marybelle Killings  Patient Status: [facility] - In-pt  History of Present Illness: Ian Roberts is a 58 y.o. male   Metastatic lung cancer To brain; liver and bone(shoulder) Was scheduled for G tube in IR as OP 1/29 Sent to ED after evaluation in [facility] Pt was with dehydration/hypokalemia  Intractable N/V Small bowel obstruction vs ileus  Failure to thrive; wt loss Dysphagia; malnutrition Prognosis poor  Request percutaneous gastric tube placement For decompression/venting for now IMPRESSION: Numerous distended loops of small bowel throughout the abdomen with paucity of colonic gas and mild scattered stool in colon, question small-bowel obstruction versus ileus.  Imaging has been reviewed and appoved  Past Medical History:  Diagnosis Date  . Bone lesion    right shoulder found on x-ray  . CAD (coronary artery disease)   . Cancer ([place])   . Dyslipidemia   . Hypertension   . Hypokalemia   . Necrotizing fasciitis ([place])   . Rotator cuff tear   . ST elevation myocardial infarction (STEMI) of inferior wall (HCC)    Assocaited with paroxysmal atrial fibrillation 05/13/2008; Xience drug eluting stent  . Stroke [facility])     Past Surgical History:  Procedure Laterality Date  . CORONARY ANGIOPLASTY WITH STENT PLACEMENT    . CYST EXCISION    . IR IMAGING GUIDED PORT INSERTION  02/11/2018  . TONSILLECTOMY      Allergies: Hydrocodone  Medications: Prior to Admission medications   Medication Sig Start Date End Date Taking? Authorizing Provider  acetaminophen (TYLENOL) 500 MG tablet Take 1,000 mg by mouth every 6 (six) hours as needed for mild pain.   Yes [provider]  CARBOPLATIN IV Inject into the vein.   Yes [provider]  dexamethasone (DECADRON) 4 MG  tablet Take 1 tablet (4 mg total) by mouth daily. 02/22/18  Yes Lockamy, Randi L, NP-C  famotidine (PEPCID) 20 MG tablet Take 20 mg by mouth daily as needed for heartburn or indigestion.   Yes [provider]  folic acid (FOLVITE) 1 MG tablet Take 1 tablet (1 mg total) by mouth daily. 02/22/18  Yes Lockamy, Randi L, NP-C  lidocaine-prilocaine (EMLA) cream Apply small amount over port site and cover with plastic wrap one hour prior to appointment. 02/24/18  Yes Derek Jack, MD  morphine (MSIR) 30 MG tablet Take 1 tablet (30 mg total) by mouth every 6 (six) hours as needed for severe pain. 03/21/18  Yes Lockamy, Randi L, NP-C  ondansetron (ZOFRAN ODT) 4 MG disintegrating tablet Take 1 tablet (4 mg total) by mouth every 8 (eight) hours as needed for nausea or vomiting. 03/18/18  Yes Derek Jack, MD  PEMBROLIZUMAB IV Inject into the vein.   Yes [provider]  scopolamine (TRANSDERM-SCOP) 1 MG/3DAYS Place 1 patch (1.5 mg total) onto the skin every 3 (three) days. 02/24/18  Yes Derek Jack, MD     Family History  Problem Relation Age of Onset  . Diabetes Mother   . Hypertension Mother   . Lung cancer Father     Social History   Socioeconomic History  . Marital status: Married    Spouse name: Not on file  . Number of children: 2  . Years of education: Not on file  . Highest education level: Not on file  Occupational History  . Occupation: Full time Dealer    Comment: unemployed  Social Needs  . Financial resource strain: Not very hard  . Food insecurity:    Worry: Never true    Inability: Never true  . Transportation needs:    Medical: No    Non-medical: No  Tobacco Use  . Smoking status: Current Every Day Smoker    Packs/day: 0.50    Years: 41.00    Pack years: 20.50    Types: Cigarettes  . Smokeless tobacco: Never Used  Substance and Sexual Activity  . Alcohol use: Never    Frequency: Never  . Drug use: Never  . Sexual activity:  Not Currently  Lifestyle  . Physical activity:    Days per week: 0 days    Minutes per session: 0 min  . Stress: To some extent  Relationships  . Social connections:    Talks on phone: More than three times a week    Gets together: More than three times a week    Attends religious service: Never    Active member of club or organization: No    Attends meetings of clubs or organizations: Never    Relationship status: Married  Other Topics Concern  . Not on file  Social History Narrative   Married    Review of Systems: A 12 point ROS discussed and pertinent positives are indicated in the HPI above.  All other systems are negative.  Review of Systems  Constitutional: Positive for activity change, appetite change, fatigue and unexpected weight change.  Respiratory: Negative for shortness of breath.   Gastrointestinal: Positive for nausea and vomiting. Negative for abdominal pain.  Neurological: Positive for weakness.  Psychiatric/Behavioral: Negative for behavioral problems.    Vital Signs: BP 90/72 (BP Location: Left Arm)   Pulse (!) 108   Temp (!) 97.4 F (36.3 C) (Oral)   Resp 18   Ht 5\' 11"  (1.803 m)   Wt 154 lb 5.2 oz (70 kg)   SpO2 98%   BMI 21.52 kg/m   Physical Exam Vitals signs reviewed.  Cardiovascular:     Rate and Rhythm: Normal rate and regular rhythm.  Pulmonary:     Effort: Pulmonary effort is normal.     Breath sounds: Normal breath sounds.  Abdominal:     General: Bowel sounds are normal.  Skin:    General: Skin is warm and dry.  Neurological:     Mental Status: He is alert and oriented to person, place, and time.  Psychiatric:        Mood and Affect: Mood normal.        Behavior: Behavior normal.        Thought Content: Thought content normal.        Judgment: Judgment normal.     Imaging: Dg Abd 2 Views  Result Date: 04/21/2018 CLINICAL DATA:  Persistent vomiting, weakness, history of coronary artery disease post MI, metastatic lung  cancer, hypertension, stroke EXAM: ABDOMEN - 2 VIEW COMPARISON:  CT abdomen and pelvis 03/09/2018 FINDINGS: Numerous air-filled distended loops of small bowel throughout abdomen. Small amounts of stool within colon and rectum. Paucity of colonic gas. No definite bowel wall thickening or free air. Gaseous distention of stomach. No acute osseous findings. IMPRESSION: Numerous distended loops of small bowel throughout the abdomen with paucity of colonic gas and mild scattered stool in colon, question small-bowel obstruction versus ileus. No evidence of perforation. Electronically Signed   By: Crist Infante.D.  On: 04/21/2018 12:55    Labs:  CBC: Recent Labs    04/07/18 0104 04/20/18 1008 04/21/18 0353 04/22/18 0414  WBC 3.3* 12.4* 9.1 11.5*  HGB 9.8* 8.0* 7.6* 7.0*  HCT 30.5* 24.6* 23.2* 22.2*  PLT 123* 521* 466* 425*    COAGS: Recent Labs    02/08/18 1029 04/20/18 1035  INR 1.15 1.47    BMP: Recent Labs    04/07/18 0104 04/20/18 1008 04/20/18 1925 04/21/18 0353 04/22/18 0414  NA 130* 127*  --  132* 139  K 3.2* 2.7* 3.4* 3.5 2.8*  CL 97* 90*  --  99 106  CO2 21* 19*  --  19* 19*  GLUCOSE 130* 182*  --  84 150*  BUN 20 17  --  18 19  CALCIUM 8.3* 8.0*  --  7.6* 7.6*  CREATININE 0.57* 0.97  --  0.90 0.86  GFRNONAA >60 >60  --  >60 >60  GFRAA >60 >60  --  >60 >60    LIVER FUNCTION TESTS: Recent Labs    03/31/18 1042 04/07/18 0104 04/20/18 1008 04/22/18 0414  BILITOT 1.9* 3.0* 2.1* 1.9*  AST 16 21 17  11*  ALT 11 17 15 10   ALKPHOS 219* 229* 231* 207*  PROT 8.0 8.3* 7.4 6.1*  ALBUMIN 2.6* 2.7* 2.1* 1.8*    TUMOR MARKERS: No results for input(s): AFPTM, CEA, CA199, CHROMGRNA in the last 8760 hours.  Assessment and Plan:  Lung cancer with mets to brain, liver and bone Dehydration; malnutrition Wt loss Small bowel obstruction vs ileus For venting Gastric tube Risks and benefits discussed with the patient including, but not limited to the need for a barium  enema during the procedure, bleeding, infection, peritonitis, or damage to adjacent structures.  All of the patient's questions were answered, patient is agreeable to proceed. Consent signed and in chart.  Thank you for this interesting consult.  I greatly enjoyed meeting Ian Roberts and look forward to participating in their care.  A copy of this report was sent to the requesting provider on this date.  Electronically Signed: Lavonia Drafts, PA-C 04/22/2018, 12:56 PM   I spent a total of 40 Minutes    in face to face in clinical consultation, greater than 50% of which was counseling/coordinating care for gastric tube placement

## 2018-04-22 NOTE — Progress Notes (Signed)
[facility]/Trauma Progress Note      Assessment/Plan HTN HLD CAD, Hx of STEMI in 2010 s/p stent placement Hx of CVA Metastatic adenocarcinoma of lung - mets to brain, liver, right clavicle  Severe protein calorie malnutrition/Failure to thrive SBO - abdominal film with distended loops of small bowel with colonic gas and some scattered stool in colon, pt feeling better today without vomiting overnight, unable to place NGT - recommend IR consult for palliative G-tube, patient was scheduled for this as an outpatient but this could be done while admitted - palliative consult pending - pt would benefit from less invasive G tube/PEG but we will follow along if open G tube is needed.    LOS: 1 day    Subjective: CC: SBO  Pt denies abdominal pain and has not had any vomiting overnight. He states not abdominal bloating and no hx of abdominal surgeries. Wife at bedside. Discussed plan for IR and we will follow along if open G tube is needed.   Objective: Vital signs in last 24 hours: Temp:  [97.4 F (36.3 C)-98.9 F (37.2 C)] 97.4 F (36.3 C) (01/31 0622) Pulse Rate:  [108-117] 108 (01/31 0622) Resp:  [16-18] 18 (01/30 2111) BP: (106-120)/(72-77) 120/77 (01/31 0622) SpO2:  [98 %-100 %] 98 % (01/30 2111) Last BM Date: 04/19/18  Intake/Output from previous day: 01/30 0701 - 01/31 0700 In: -  Out: 1750 [Urine:1250; Emesis/NG output:500] Intake/Output this shift: No intake/output data recorded.  PE: Gen:  Alert, NAD, ill appearing  Pulm:  Rate and effort normal Abd: Soft, NT/ND, hypoactive BS, no HSM, no abdominal scars noted Skin: warm and dry   Anti-infectives: Anti-infectives (From admission, onward)   None      Lab Results:  Recent Labs    04/21/18 0353 04/22/18 0414  WBC 9.1 11.5*  HGB 7.6* 7.0*  HCT 23.2* 22.2*  PLT 466* 425*   BMET Recent Labs    04/21/18 0353 04/22/18 0414  NA 132* 139  K 3.5 2.8*  CL 99 106  CO2 19* 19*  GLUCOSE  84 150*  BUN 18 19  CREATININE 0.90 0.86  CALCIUM 7.6* 7.6*   PT/INR Recent Labs    04/20/18 1035  LABPROT 17.7*  INR 1.47   CMP     Component Value Date/Time   NA 139 04/22/2018 0414   K 2.8 (L) 04/22/2018 0414   CL 106 04/22/2018 0414   CO2 19 (L) 04/22/2018 0414   GLUCOSE 150 (H) 04/22/2018 0414   BUN 19 04/22/2018 0414   CREATININE 0.86 04/22/2018 0414   CALCIUM 7.6 (L) 04/22/2018 0414   PROT 6.1 (L) 04/22/2018 0414   ALBUMIN 1.8 (L) 04/22/2018 0414   AST 11 (L) 04/22/2018 0414   ALT 10 04/22/2018 0414   ALKPHOS 207 (H) 04/22/2018 0414   BILITOT 1.9 (H) 04/22/2018 0414   GFRNONAA >60 04/22/2018 0414   GFRAA >60 04/22/2018 0414   Lipase     Component Value Date/Time   LIPASE 25 03/09/2018 0013    Studies/Results: Dg Abd 2 Views  Result Date: 04/21/2018 CLINICAL DATA:  Persistent vomiting, weakness, history of coronary artery disease post MI, metastatic lung cancer, hypertension, stroke EXAM: ABDOMEN - 2 VIEW COMPARISON:  CT abdomen and pelvis 03/09/2018 FINDINGS: Numerous air-filled distended loops of small bowel throughout abdomen. Small amounts of stool within colon and rectum. Paucity of colonic gas. No definite bowel wall thickening or free air. Gaseous distention of stomach. No acute osseous findings. IMPRESSION: Numerous distended  loops of small bowel throughout the abdomen with paucity of colonic gas and mild scattered stool in colon, question small-bowel obstruction versus ileus. No evidence of perforation. Electronically Signed   By: Lavonia Dana M.D.   On: 04/21/2018 12:55      Kalman Drape , [facility] 04/22/2018, 8:18 AM  Pager: [phone] Mon-Wed, Friday 7:00am-4:30pm Thurs 7am-11:30am  Consults: [phone]

## 2018-04-22 NOTE — Progress Notes (Signed)
PT Cancellation Note  Patient Details Name: NAVON KOTOWSKI MRN: [id number] DOB: [birth year]   Cancelled Treatment:    Reason Eval/Treat Not Completed: Patient declined, no reason specified.  Pt just doesn't feel up to it. 04/22/2018  Donnella Sham, [place] [phone]  (pager) [phone]  (office)   Tessie Fass Roben Tatsch 04/22/2018, 5:34 PM

## 2018-04-23 DIAGNOSIS — D649 Anemia, unspecified: Secondary | ICD-10-CM

## 2018-04-23 LAB — CBC WITH DIFFERENTIAL/PLATELET
BASOS PCT: 0 %
Band Neutrophils: 0 %
Basophils Absolute: 0 10*3/uL (ref 0.0–0.1)
Blasts: 0 %
Eosinophils Absolute: 0 10*3/uL (ref 0.0–0.5)
Eosinophils Relative: 0 %
HCT: 21.3 % — ABNORMAL LOW (ref 39.0–52.0)
Hemoglobin: 6.5 g/dL — CL (ref 13.0–17.0)
Lymphocytes Relative: 5 %
Lymphs Abs: 0.6 10*3/uL — ABNORMAL LOW (ref 0.7–4.0)
MCH: 29 pg (ref 26.0–34.0)
MCHC: 30.5 g/dL (ref 30.0–36.0)
MCV: 95.1 fL (ref 80.0–100.0)
MONOS PCT: 6 %
Metamyelocytes Relative: 0 %
Monocytes Absolute: 0.7 10*3/uL (ref 0.1–1.0)
Myelocytes: 0 %
NEUTROS ABS: 10.4 10*3/uL — AB (ref 1.7–7.7)
Neutrophils Relative %: 89 %
Other: 0 %
Platelets: 366 10*3/uL (ref 150–400)
Promyelocytes Relative: 0 %
RBC: 2.24 MIL/uL — ABNORMAL LOW (ref 4.22–5.81)
RDW: 23.3 % — AB (ref 11.5–15.5)
WBC: 11.7 10*3/uL — ABNORMAL HIGH (ref 4.0–10.5)
nRBC: 0 % (ref 0.0–0.2)
nRBC: 0 /100 WBC

## 2018-04-23 LAB — ABO/RH: ABO/RH(D): O POS

## 2018-04-23 LAB — BASIC METABOLIC PANEL
Anion gap: 11 (ref 5–15)
BUN: 16 mg/dL (ref 6–20)
CALCIUM: 7.6 mg/dL — AB (ref 8.9–10.3)
CO2: 17 mmol/L — ABNORMAL LOW (ref 22–32)
Chloride: 111 mmol/L (ref 98–111)
Creatinine, Ser: 0.7 mg/dL (ref 0.61–1.24)
GFR calc Af Amer: >60 mL/min (ref >60)
GFR calc non Af Amer: >60 mL/min (ref >60)
Glucose, Bld: 100 mg/dL — ABNORMAL HIGH (ref 70–99)
Potassium: 4.3 mmol/L (ref 3.5–5.1)
Sodium: 139 mmol/L (ref 135–145)

## 2018-04-23 LAB — PREPARE RBC (CROSSMATCH)

## 2018-04-23 LAB — HEMOGLOBIN AND HEMATOCRIT, BLOOD
HCT: 29.6 % — ABNORMAL LOW (ref 39.0–52.0)
Hemoglobin: 9.3 g/dL — ABNORMAL LOW (ref 13.0–17.0)

## 2018-04-23 LAB — MAGNESIUM: Magnesium: 1.5 mg/dL — ABNORMAL LOW (ref 1.7–2.4)

## 2018-04-23 MED ORDER — MAGNESIUM SULFATE 2 GM/50ML IV SOLN
2.0000 g | Freq: Once | INTRAVENOUS | Status: AC
  Administered 2018-04-23: 2 g via INTRAVENOUS
  Filled 2018-04-23: qty 50

## 2018-04-23 MED ORDER — SODIUM CHLORIDE 0.9% IV SOLUTION
Freq: Once | INTRAVENOUS | Status: AC
  Administered 2018-04-23: 11:00:00 via INTRAVENOUS

## 2018-04-23 MED ORDER — ONDANSETRON HCL 4 MG/2ML IJ SOLN
4.0000 mg | Freq: Four times a day (QID) | INTRAMUSCULAR | Status: DC | PRN
  Administered 2018-04-24: 4 mg via INTRAVENOUS
  Filled 2018-04-23: qty 2

## 2018-04-23 MED ORDER — ONDANSETRON HCL 4 MG PO TABS: 4.0000 mg | ORAL_TABLET | Freq: Four times a day (QID) | ORAL | Status: DC | PRN

## 2018-04-23 MED ORDER — PROMETHAZINE HCL 25 MG/ML IJ SOLN
12.5000 mg | Freq: Four times a day (QID) | INTRAMUSCULAR | Status: DC | PRN
  Administered 2018-04-25 – 2018-04-29 (×2): 12.5 mg via INTRAVENOUS
  Filled 2018-04-23 (×2): qty 1

## 2018-04-23 NOTE — Progress Notes (Signed)
1 unit of prbc started at this time for a hgb 6.5. dual signed off with Caro Laroche, RN. Spouse at bedside. Education provided about transfusion reactions discussed and patient/spouse verbalized understanding. This RN stayed at bedside for first 15 minutes with on s/s of reaction noted. Will continue to monitor patient.

## 2018-04-23 NOTE — Progress Notes (Signed)
Patient had not urinated yet for this shift. Patient bladder scanned with highest reading >803 ml. Then, patient stated that he needed to urinate. This RN helped patient with urinal in bed. Patient urinated 175 ml. This RN turned water on and massaged patient's bladder with no increase in urination. Bladder scanned for highest of >573. [place], NP notified. Order placed for In and Out cath x1. In and Out cath completed with assistance from [place], [place] per protocol. Patient did moan a few times with insertion but tolerated well overall. Urine output 475 ml. Will continue to monitor.

## 2018-04-23 NOTE — Progress Notes (Signed)
   04/23/18 1341  Urine Characteristics  Urinary Interventions Bladder scan  Bladder Scan Volume (mL) 723 mL   Dr. Starla Link paged.

## 2018-04-23 NOTE — Progress Notes (Signed)
2nd unit of prbc started at this time. Dual signed off with Caro Laroche, RN. Transfusion reactions discussed with patient and family member at bedside. This RN at bedside for first 15 minutes of transfusion without any s/s of transfusion reaction noted. Will continue to monitor patient.

## 2018-04-23 NOTE — Progress Notes (Signed)
CRITICAL VALUE ALERT  Critical Value:  Hgb 6.5    Date & Time Notied:  04/23/18 at 0537  Provider Notified: Kennon Holter, NP text paged.  Orders Received/Actions taken: Awaiting response.

## 2018-04-23 NOTE — Progress Notes (Signed)
PT Cancellation Note  Patient Details Name: WALFRED BETTENDORF MRN: [id number] DOB: 09/11/1960   Cancelled Treatment:    Reason Eval/Treat Not Completed: Patient declined, no reason specified.  Just completed his transfusion, running saline.  His mother in the room who states he is not usually OOB.  Has been in bed for months but not sure.  Return tomorrow to recheck his willingness to get up and assess mobility.   Ramond Dial 04/23/2018, 2:23 PM   Mee Hives, PT MS Acute Rehab Dept. Number: [place]

## 2018-04-23 NOTE — Progress Notes (Signed)
Patient ID: Ian Roberts, male   DOB: 1960/08/06, 58 y.o.   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  He was supposed to have PEG tube placement as an outpatient on 04/20/2018 but got admitted because of severe dehydration and hypokalemia.  He was started on IV fluids. He was found to have bowel obstruction versus ileus, general surgery was consulted. palliative care was also consulted.  Assessment & Plan:    Active Problems:   Primary malignant neoplasm of lung metastatic to other site [facility])   Hypokalemia   Protein-calorie malnutrition, severe   Pressure injury of skin   Palliative care by specialist   Weakness   Dehydration   Abdominal pain  Small bowel obstruction versus ileus presenting with intractable nausea and vomiting -Vomiting has improved.  Continue antiemetics. -General surgery following.  No bowel movement yet.  Dehydration -From poor oral intake.  Decrease normal saline to 50 cc an hour.  Chronic anemia -Probably from cancer.  Hemoglobin is 6.5 today.  No evidence of any overt bleeding.  Will transfuse packed red cells.  Monitor  Hypokalemia -Replaced.  Improved.  Hypomagnesemia -Replace.  Repeat a.m. labs  Failure to thrive with decreased p.o. intake -From metastatic cancer -Patient was scheduled for PEG tube placement on 04/20/2018 as an outpatient but was postponed due to dehydration and hypokalemia.  -IR has been consulted for G-tube placement.   -IV fluids plan as above.  Metastatic lung cancer with metastases to brain, liver and shoulder -Currently on chemotherapy as an outpatient and follows up with oncology at [facility] -Overall prognosis is very poor.   Palliative care following.  CT of chest abdomen and pelvis shows increasing size of lung malignancy along with some stable and some worsening metastases.  Outpatient follow-up with oncology. Patient remains full code for now.  Chronic pain -Continue IV Dilaudid for now.  Hypertension -Blood pressure on hold for now.  ACE inhibitor on hold   DVT prophylaxis: Lovenox Code Status: Full Family Communication: Wife at bedside Disposition Plan: Depends on clinical outcome  Consultants: Palliative care Procedures: None  Antimicrobials: None   Subjective: Patient seen and examined at bedside.  He is a poor historian.  He is still nauseous and had some vomiting yesterday.  Has not had any bowel movement yet.  Intermittent abdominal pain.  No fever or worsening shortness of breath. Objective: Vitals:   04/22/18 0852 04/22/18 1454 04/22/18 2137 04/23/18 0523  BP: 90/72 104/75 109/78 106/76  Pulse: (!) 108 (!) 114 (!) 108 (!) 119  Resp:  15 20 18   Temp:  97.9 F (36.6 C) 98.1 F (36.7 C) 98 F (36.7 C)  TempSrc:  Oral Oral Oral  SpO2:   99% 98%  Weight:      Height:        Intake/Output Summary (Last 24 hours) at 04/23/2018 0904 Last data filed at 04/23/2018 0700 Gross per 24 hour  Intake 1320 ml  Output 1973 ml  Net -653 ml   Filed Weights   04/20/18 1105  Weight: 70 kg    Examination:  General exam: Appears chronically ill.  Poor historian.  No distress.  Awake but answers only very little questions. Respiratory system: Bilateral decreased breath  sounds at bases, no wheezing Cardiovascular system: S1 & S2 heard, still tachycardic Gastrointestinal system: Abdomen is nondistended, soft and mildly tender in the epigastric region/umbilical region.  Bowel sounds sluggish Extremities: No cyanosis, edema    Data Reviewed: I have personally reviewed following labs and imaging studies  CBC: Recent Labs  Lab 04/20/18 1008 04/21/18 0353 04/22/18 0414 04/23/18 0324  WBC  12.4* 9.1 11.5* 11.7*  NEUTROABS  --   --  10.2* 10.4*  HGB 8.0* 7.6* 7.0* 6.5*  HCT 24.6* 23.2* 22.2* 21.3*  MCV 86.9 88.5 92.1 95.1  PLT 521* 466* 425* 937   Basic Metabolic Panel: Recent Labs  Lab 04/20/18 1008 04/20/18 1925 04/21/18 0353 04/22/18 0414 04/23/18 0324  NA 127*  --  132* 139 139  K 2.7* 3.4* 3.5 2.8* 4.3  CL 90*  --  99 106 111  CO2 19*  --  19* 19* 17*  GLUCOSE 182*  --  84 150* 100*  BUN 17  --  18 19 16   CREATININE 0.97  --  0.90 0.86 0.70  CALCIUM 8.0*  --  7.6* 7.6* 7.6*  MG  --   --   --  1.7 1.5*   GFR: Estimated Creatinine Clearance: 102.1 mL/min (by C-G formula based on SCr of 0.7 mg/dL). Liver Function Tests: Recent Labs  Lab 04/20/18 1008 04/22/18 0414  AST 17 11*  ALT 15 10  ALKPHOS 231* 207*  BILITOT 2.1* 1.9*  PROT 7.4 6.1*  ALBUMIN 2.1* 1.8*   No results for input(s): LIPASE, AMYLASE in the last 168 hours. No results for input(s): AMMONIA in the last 168 hours. Coagulation Profile: Recent Labs  Lab 04/20/18 1035  INR 1.47   Cardiac Enzymes: No results for input(s): CKTOTAL, CKMB, CKMBINDEX, TROPONINI in the last 168 hours. BNP (last 3 results) No results for input(s): PROBNP in the last 8760 hours. HbA1C: No results for input(s): HGBA1C in the last 72 hours. CBG: No results for input(s): GLUCAP in the last 168 hours. Lipid Profile: No results for input(s): CHOL, HDL, LDLCALC, TRIG, CHOLHDL, LDLDIRECT in the last 72 hours. Thyroid Function Tests: No results for input(s): TSH, T4TOTAL, FREET4, T3FREE, THYROIDAB in the last 72 hours. Anemia Panel: No results for input(s): VITAMINB12, FOLATE, FERRITIN, TIBC, IRON, RETICCTPCT in the last 72 hours. Sepsis Labs: No results for input(s): PROCALCITON, LATICACIDVEN in the last 168 hours.  No results found for this or any previous visit (from the past 240 hour(s)).       Radiology Studies: Ct Angio Chest Pe W Or Wo Contrast  Result Date: 04/22/2018 CLINICAL DATA:  Lung  cancer with bony metastasis to the RIGHT shoulder and liver metastasis. EXAM: CT ANGIOGRAPHY CHEST CT ABDOMEN AND PELVIS WITH CONTRAST TECHNIQUE: Multidetector CT imaging of the chest was performed using the standard protocol during bolus administration of intravenous contrast. Multiplanar CT image reconstructions and MIPs were obtained to evaluate the vascular anatomy. Multidetector CT imaging of the abdomen and pelvis was performed using the standard protocol during bolus administration of intravenous contrast. CONTRAST:  167mL ISOVUE-370 IOPAMIDOL (ISOVUE-370) INJECTION 76% COMPARISON:  CT 03/09/2018, 01/28/2018 FINDINGS: CTA CHEST FINDINGS Cardiovascular: There is narrowing of the branches of the LEFT lobe pulmonary arteries by a LEFT hilar tumor as well as narrowing lingular branch however there is no endoluminal filling defect suggest acute pulmonary embolism. No pulmonary embolism within the RIGHT lung either. Port in the anterior chest wall with tip in distal SVC. Mediastinum/Nodes: No axillary or supraclavicular enlarged nodes.  No mediastinal lymphadenopathy. Esophagus is patulous scratch the esophagus is fluid-filled unchanged from prior. Lungs/Pleura: LEFT perihilar mass surrounding structures of the LEFT hilum again noted. There is complete obstruction of the LEFT lobe bronchus. There is new/increased LEFT lower lobe atelectasis. There is loculated effusion at the LEFT lung base new from prior. There is nodular thickening of the pleural space. Small RIGHT effusion.  Mild interstitial edema. Musculoskeletal: Destructive lesion involving the RIGHT scapula extending the soft tissue musculature of the RIGHT shoulder again noted. Review of the MIP images confirms the above findings. CT ABDOMEN and PELVIS FINDINGS Hepatobiliary: Round targetoid lesions within the LEFT and RIGHT hepatic lobe consistent metastasis not changed from prior. Gallbladder is distended to 4.7 cm. Common bile duct normal caliber.  Pancreas: Pancreas is normal. No ductal dilatation. No pancreatic inflammation. Spleen: Normal spleen Adrenals/urinary tract: The thickening of the RIGHT adrenal gland to 11 mm consistent with metastasis. No change. Kidneys, ureters bladder normal Stomach/Bowel: Stomach is normal. Fluid within the esophagus noted. There is dilatation of the duodenum and proximal small bowel. Air-fluid levels within the small bowel. The small bowel mild distension (approximately 3.5 cm) is uniform and extends into the distal small bowel with there is caliber change leading up to the terminal ileum. There is no clear transition point identified. No pneumatosis. No portal venous gas. Vascular/Lymphatic: Abdominal aorta normal. Retroperitoneal lymph node LEFT aorta increased in size measuring 12 mm (image 34/12 Reproductive: Prostate normal Other: No free fluid. Musculoskeletal: The sclerotic lesions in the lumbar spine consistent metastasis more prominent than prior (image 80/16 involving the L3 and L4 vertebral bodies. Aggressive RIGHT scapular lesion again noted. Review of the MIP images confirms the above findings. IMPRESSION: Chest Impression: 1. No evidence acute pulmonary embolism. 2. Interval increase in RIGHT hilar malignancy constricting hilar structures including the pulmonary arteries and particularly the LEFT lower lobe bronchus. 3. LEFT lower bronchus is completely constricted with new atelectasis of the LEFT lower lobe. New loculated pleural effusion and nodularity in the LEFT hemithorax concerning for pleural spread of metastasis. 4. Aggressive bulky lytic lesion in the RIGHT scapula extending into periscapular musculature Abdomen / Pelvis Impression: 1. SMALL BOWEL OBSTRUCTION PATTERN: New moderate dilatation small bowel involving the jejunum and moderate portion of the ileum. A caliber change in the distal ileum. No transition point identified. Differential of ileus versus early small bowel obstruction. Fluid in the  duodenum is concerning for SMALL BOWEL OBSTRUCTION. 2. Stable hepatic metastasis. 3. Interval increase in adenopathy in the LEFT periaortic retroperitoneum. 4. Stable RIGHT adrenal metastasis. These results will be called to the ordering clinician or representative by the Radiologist Assistant, and communication documented in the PACS or zVision Dashboard. Electronically Signed   By: Suzy Bouchard M.D.   On: 04/22/2018 14:08   Ct Abdomen Pelvis W Contrast  Result Date: 04/22/2018 CLINICAL DATA:  Lung cancer with bony metastasis to the RIGHT shoulder and liver metastasis. EXAM: CT ANGIOGRAPHY CHEST CT ABDOMEN AND PELVIS WITH CONTRAST TECHNIQUE: Multidetector CT imaging of the chest was performed using the standard protocol during bolus administration of intravenous contrast. Multiplanar CT image reconstructions and MIPs were obtained to evaluate the vascular anatomy. Multidetector CT imaging of the abdomen and pelvis was performed using the standard protocol during bolus administration of intravenous contrast. CONTRAST:  155mL ISOVUE-370 IOPAMIDOL (ISOVUE-370) INJECTION 76% COMPARISON:  CT 03/09/2018, 01/28/2018 FINDINGS: CTA CHEST FINDINGS Cardiovascular: There is narrowing of the branches of the LEFT lobe pulmonary arteries by a LEFT hilar tumor as  well as narrowing lingular branch however there is no endoluminal filling defect suggest acute pulmonary embolism. No pulmonary embolism within the RIGHT lung either. Port in the anterior chest wall with tip in distal SVC. Mediastinum/Nodes: No axillary or supraclavicular enlarged nodes. No mediastinal lymphadenopathy. Esophagus is patulous scratch the esophagus is fluid-filled unchanged from prior. Lungs/Pleura: LEFT perihilar mass surrounding structures of the LEFT hilum again noted. There is complete obstruction of the LEFT lobe bronchus. There is new/increased LEFT lower lobe atelectasis. There is loculated effusion at the LEFT lung base new from prior. There  is nodular thickening of the pleural space. Small RIGHT effusion.  Mild interstitial edema. Musculoskeletal: Destructive lesion involving the RIGHT scapula extending the soft tissue musculature of the RIGHT shoulder again noted. Review of the MIP images confirms the above findings. CT ABDOMEN and PELVIS FINDINGS Hepatobiliary: Round targetoid lesions within the LEFT and RIGHT hepatic lobe consistent metastasis not changed from prior. Gallbladder is distended to 4.7 cm. Common bile duct normal caliber. Pancreas: Pancreas is normal. No ductal dilatation. No pancreatic inflammation. Spleen: Normal spleen Adrenals/urinary tract: The thickening of the RIGHT adrenal gland to 11 mm consistent with metastasis. No change. Kidneys, ureters bladder normal Stomach/Bowel: Stomach is normal. Fluid within the esophagus noted. There is dilatation of the duodenum and proximal small bowel. Air-fluid levels within the small bowel. The small bowel mild distension (approximately 3.5 cm) is uniform and extends into the distal small bowel with there is caliber change leading up to the terminal ileum. There is no clear transition point identified. No pneumatosis. No portal venous gas. Vascular/Lymphatic: Abdominal aorta normal. Retroperitoneal lymph node LEFT aorta increased in size measuring 12 mm (image 34/12 Reproductive: Prostate normal Other: No free fluid. Musculoskeletal: The sclerotic lesions in the lumbar spine consistent metastasis more prominent than prior (image 80/16 involving the L3 and L4 vertebral bodies. Aggressive RIGHT scapular lesion again noted. Review of the MIP images confirms the above findings. IMPRESSION: Chest Impression: 1. No evidence acute pulmonary embolism. 2. Interval increase in RIGHT hilar malignancy constricting hilar structures including the pulmonary arteries and particularly the LEFT lower lobe bronchus. 3. LEFT lower bronchus is completely constricted with new atelectasis of the LEFT lower lobe. New  loculated pleural effusion and nodularity in the LEFT hemithorax concerning for pleural spread of metastasis. 4. Aggressive bulky lytic lesion in the RIGHT scapula extending into periscapular musculature Abdomen / Pelvis Impression: 1. SMALL BOWEL OBSTRUCTION PATTERN: New moderate dilatation small bowel involving the jejunum and moderate portion of the ileum. A caliber change in the distal ileum. No transition point identified. Differential of ileus versus early small bowel obstruction. Fluid in the duodenum is concerning for SMALL BOWEL OBSTRUCTION. 2. Stable hepatic metastasis. 3. Interval increase in adenopathy in the LEFT periaortic retroperitoneum. 4. Stable RIGHT adrenal metastasis. These results will be called to the ordering clinician or representative by the Radiologist Assistant, and communication documented in the PACS or zVision Dashboard. Electronically Signed   By: Suzy Bouchard M.D.   On: 04/22/2018 14:08   Dg Abd 2 Views  Result Date: 04/21/2018 CLINICAL DATA:  Persistent vomiting, weakness, history of coronary artery disease post MI, metastatic lung cancer, hypertension, stroke EXAM: ABDOMEN - 2 VIEW COMPARISON:  CT abdomen and pelvis 03/09/2018 FINDINGS: Numerous air-filled distended loops of small bowel throughout abdomen. Small amounts of stool within colon and rectum. Paucity of colonic gas. No definite bowel wall thickening or free air. Gaseous distention of stomach. No acute osseous findings. IMPRESSION: Numerous distended loops  of small bowel throughout the abdomen with paucity of colonic gas and mild scattered stool in colon, question small-bowel obstruction versus ileus. No evidence of perforation. Electronically Signed   By: Lavonia Dana M.D.   On: 04/21/2018 12:55        Scheduled Meds: . sodium chloride   Intravenous Once  . amLODipine  10 mg Oral Daily  . dexamethasone  4 mg Oral Daily  . dronabinol  2.5 mg Oral BID AC  . enoxaparin (LOVENOX) injection  40 mg  Subcutaneous Q24H  . folic acid  1 mg Oral Daily  . mouth rinse  15 mL Mouth Rinse BID  . ondansetron (ZOFRAN) IV  4 mg Intravenous Q6H   Or  . ondansetron  4 mg Oral Q6H  . pantoprazole (PROTONIX) IV  40 mg Intravenous Q24H  . scopolamine  1 patch Transdermal Q72H  . simvastatin  20 mg Oral Daily  . sodium chloride flush  10-40 mL Intracatheter Q12H   Continuous Infusions: . 0.9 % NaCl with KCl 40 mEq / L 50 mL/hr (04/23/18 0738)  . magnesium sulfate 1 - 4 g bolus IVPB       LOS: 2 days        Aline August, MD [facility] Pager [phone]  If 7PM-7AM, please contact night-coverage [email or web address] Password [email or web address] 04/23/2018, 9:04 AM

## 2018-04-23 NOTE — Progress Notes (Signed)
   04/23/18 1542  Urine Characteristics  Urinary Interventions Intermittent/Straight cath  Intermittent/Straight Cath (mL) 825 mL  Intermittent Catheter Size 14

## 2018-04-24 ENCOUNTER — Inpatient Hospital Stay (HOSPITAL_COMMUNITY): Payer: Medicaid Other

## 2018-04-24 DIAGNOSIS — C799 Secondary malignant neoplasm of unspecified site: Secondary | ICD-10-CM

## 2018-04-24 DIAGNOSIS — Z7189 Other specified counseling: Secondary | ICD-10-CM

## 2018-04-24 DIAGNOSIS — K56609 Unspecified intestinal obstruction, unspecified as to partial versus complete obstruction: Secondary | ICD-10-CM

## 2018-04-24 LAB — BPAM RBC
BLOOD PRODUCT EXPIRATION DATE: 202003042359
Blood Product Expiration Date: 202003042359
ISSUE DATE / TIME: 202002011121
ISSUE DATE / TIME: 202002011524
Unit Type and Rh: 5100
Unit Type and Rh: 5100

## 2018-04-24 LAB — CBC WITH DIFFERENTIAL/PLATELET
ABS IMMATURE GRANULOCYTES: 0.17 10*3/uL — AB (ref 0.00–0.07)
Basophils Absolute: 0 10*3/uL (ref 0.0–0.1)
Basophils Relative: 0 %
Eosinophils Absolute: 0 10*3/uL (ref 0.0–0.5)
Eosinophils Relative: 0 %
HCT: 30.4 % — ABNORMAL LOW (ref 39.0–52.0)
Hemoglobin: 9.5 g/dL — ABNORMAL LOW (ref 13.0–17.0)
IMMATURE GRANULOCYTES: 2 %
Lymphocytes Relative: 6 %
Lymphs Abs: 0.7 10*3/uL (ref 0.7–4.0)
MCH: 27.2 pg (ref 26.0–34.0)
MCHC: 31.3 g/dL (ref 30.0–36.0)
MCV: 87.1 fL (ref 80.0–100.0)
Monocytes Absolute: 0.7 10*3/uL (ref 0.1–1.0)
Monocytes Relative: 6 %
NEUTROS PCT: 86 %
Neutro Abs: 9.8 10*3/uL — ABNORMAL HIGH (ref 1.7–7.7)
Platelets: 295 10*3/uL (ref 150–400)
RBC: 3.49 MIL/uL — ABNORMAL LOW (ref 4.22–5.81)
RDW: 23.8 % — ABNORMAL HIGH (ref 11.5–15.5)
WBC: 11.4 10*3/uL — ABNORMAL HIGH (ref 4.0–10.5)
nRBC: 0 % (ref 0.0–0.2)

## 2018-04-24 LAB — TYPE AND SCREEN
ABO/RH(D): O POS
Antibody Screen: NEGATIVE
Unit division: 0
Unit division: 0

## 2018-04-24 LAB — BASIC METABOLIC PANEL
ANION GAP: 10 (ref 5–15)
BUN: 13 mg/dL (ref 6–20)
CO2: 18 mmol/L — ABNORMAL LOW (ref 22–32)
Calcium: 7.6 mg/dL — ABNORMAL LOW (ref 8.9–10.3)
Chloride: 110 mmol/L (ref 98–111)
Creatinine, Ser: 0.64 mg/dL (ref 0.61–1.24)
GFR calc non Af Amer: >60 mL/min (ref >60)
Glucose, Bld: 78 mg/dL (ref 70–99)
Potassium: 3.6 mmol/L (ref 3.5–5.1)
Sodium: 138 mmol/L (ref 135–145)

## 2018-04-24 LAB — MAGNESIUM: Magnesium: 1.7 mg/dL (ref 1.7–2.4)

## 2018-04-24 NOTE — Progress Notes (Signed)
Patient ID: CHARLESTON HANKIN, male   DOB: [birth year], 58 y.o.   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  He was supposed to have PEG tube placement as an outpatient on 04/20/2018 but got admitted because of severe dehydration and hypokalemia.  He was started on IV fluids. He was found to have bowel obstruction versus ileus, general surgery was consulted. palliative care was also consulted.  Assessment & Plan:    Active Problems:   Primary malignant neoplasm of lung metastatic to other site [facility])   Hypokalemia   Protein-calorie malnutrition, severe   Pressure injury of skin   Palliative care by specialist   Weakness   Dehydration   Abdominal pain  Small bowel obstruction versus ileus presenting with intractable nausea and vomiting -Vomiting has improved.  Continue antiemetics. -General surgery has also been consulted. -Apparently had a small bowel movement yesterday.  We will get an x-ray of abdomen today.  Continue n.p.o. for now.  Dehydration -From poor oral intake.  Continue IV fluids.  Chronic anemia of chronic disease -Probably from cancer.  Status post 2 units of packed red cell transfusion on 04/23/2018 for hemoglobin of 6.5.  Hemoglobin is 9.5 today.  Monitor.  No signs of overt bleeding  Hypokalemia -Replaced.  Improved.  Hypomagnesemia -Replaced.  Repeat a.m. labs  Failure to thrive with decreased p.o. intake/hypoalbuminemia -From metastatic cancer -Patient was scheduled for PEG tube placement on 04/20/2018 as an outpatient but was postponed due to dehydration and hypokalemia.  -IR has been consulted for G-tube placement.   -IV fluids plan as above.  Metastatic lung cancer with metastases  to brain, liver and shoulder -Currently on chemotherapy as an outpatient and follows up with oncology at [facility] -Overall prognosis is very poor.  Palliative care following.  CT of chest abdomen and pelvis shows increasing size of lung malignancy along with some stable and some worsening metastases.  Outpatient follow-up with oncology. Patient remains full code for now.  Chronic pain -Continue IV Dilaudid for now.  Hypertension -Blood pressure on hold for now.  ACE inhibitor on hold  Healing full-thickness wound/pressure ulcer on sacrum/buttocks with pink dry scar tissue and patchy areas -Wound care as per wound care nurse recommendations.   DVT prophylaxis: Lovenox Code Status: Full Family Communication: Wife at bedside Disposition Plan: Depends on clinical outcome  Consultants: Palliative care/IR/general surgery Procedures: None  Antimicrobials: None   Subjective: Patient seen and examined at bedside.  He is a poor historian.  His nausea is improving.  He had a small bowel movement yesterday.  Passing some gas.  No overnight fever or vomiting.  Feels very weak and tired   objective: Vitals:   04/23/18 1545 04/23/18 1849 04/23/18 2149 04/24/18 0535  BP: 111/79 117/83 109/80 113/83  Pulse: (!) 107 (!) 102 (!) 104 (!) 108  Resp: 18 16 18 18   Temp: 97.8 F (36.6 C) (!) 97.5 F (36.4 C) 97.7 F (36.5 C) (!) 97 F (36.1 C)  TempSrc: Oral Oral    SpO2: 98% 98% 97% 98%  Weight:      Height:        Intake/Output Summary (Last 24 hours) at 04/24/2018 9937 Last data filed at 04/24/2018 1696 Gross  per 24 hour  Intake 1407.43 ml  Output 1305 ml  Net 102.43 ml   Filed Weights   04/20/18 1105  Weight: 70 kg    Examination:  General exam: Appears chronically ill.  Poor historian.  No acute distress.  Awake and answers some more questions today.   Respiratory system: Bilateral decreased breath sounds at bases, some scattered crackles Cardiovascular system: S1 &  S2 heard, tachycardic Gastrointestinal system: Abdomen is nondistended, soft and nontender.  Bowel sounds sluggish Extremities: No cyanosis, edema    Data Reviewed: I have personally reviewed following labs and imaging studies  CBC: Recent Labs  Lab 04/20/18 1008 04/21/18 0353 04/22/18 0414 04/23/18 0324 04/23/18 2119 04/24/18 0401  WBC 12.4* 9.1 11.5* 11.7*  --  11.4*  NEUTROABS  --   --  10.2* 10.4*  --  9.8*  HGB 8.0* 7.6* 7.0* 6.5* 9.3* 9.5*  HCT 24.6* 23.2* 22.2* 21.3* 29.6* 30.4*  MCV 86.9 88.5 92.1 95.1  --  87.1  PLT 521* 466* 425* 366  --  101   Basic Metabolic Panel: Recent Labs  Lab 04/20/18 1008 04/20/18 1925 04/21/18 0353 04/22/18 0414 04/23/18 0324 04/24/18 0401  NA 127*  --  132* 139 139 138  K 2.7* 3.4* 3.5 2.8* 4.3 3.6  CL 90*  --  99 106 111 110  CO2 19*  --  19* 19* 17* 18*  GLUCOSE 182*  --  84 150* 100* 78  BUN 17  --  18 19 16 13   CREATININE 0.97  --  0.90 0.86 0.70 0.64  CALCIUM 8.0*  --  7.6* 7.6* 7.6* 7.6*  MG  --   --   --  1.7 1.5* 1.7   GFR: Estimated Creatinine Clearance: 102.1 mL/min (by C-G formula based on SCr of 0.64 mg/dL). Liver Function Tests: Recent Labs  Lab 04/20/18 1008 04/22/18 0414  AST 17 11*  ALT 15 10  ALKPHOS 231* 207*  BILITOT 2.1* 1.9*  PROT 7.4 6.1*  ALBUMIN 2.1* 1.8*   No results for input(s): LIPASE, AMYLASE in the last 168 hours. No results for input(s): AMMONIA in the last 168 hours. Coagulation Profile: Recent Labs  Lab 04/20/18 1035  INR 1.47   Cardiac Enzymes: No results for input(s): CKTOTAL, CKMB, CKMBINDEX, TROPONINI in the last 168 hours. BNP (last 3 results) No results for input(s): PROBNP in the last 8760 hours. HbA1C: No results for input(s): HGBA1C in the last 72 hours. CBG: No results for input(s): GLUCAP in the last 168 hours. Lipid Profile: No results for input(s): CHOL, HDL, LDLCALC, TRIG, CHOLHDL, LDLDIRECT in the last 72 hours. Thyroid Function Tests: No results for  input(s): TSH, T4TOTAL, FREET4, T3FREE, THYROIDAB in the last 72 hours. Anemia Panel: No results for input(s): VITAMINB12, FOLATE, FERRITIN, TIBC, IRON, RETICCTPCT in the last 72 hours. Sepsis Labs: No results for input(s): PROCALCITON, LATICACIDVEN in the last 168 hours.  No results found for this or any previous visit (from the past 240 hour(s)).       Radiology Studies: Ct Angio Chest Pe W Or Wo Contrast  Result Date: 04/22/2018 CLINICAL DATA:  Lung cancer with bony metastasis to the RIGHT shoulder and liver metastasis. EXAM: CT ANGIOGRAPHY CHEST CT ABDOMEN AND PELVIS WITH CONTRAST TECHNIQUE: Multidetector CT imaging of the chest was performed using the standard protocol during bolus administration of intravenous contrast. Multiplanar CT image reconstructions and MIPs were obtained to evaluate the vascular anatomy. Multidetector CT imaging of the abdomen and pelvis was performed  using the standard protocol during bolus administration of intravenous contrast. CONTRAST:  170mL ISOVUE-370 IOPAMIDOL (ISOVUE-370) INJECTION 76% COMPARISON:  CT 03/09/2018, 01/28/2018 FINDINGS: CTA CHEST FINDINGS Cardiovascular: There is narrowing of the branches of the LEFT lobe pulmonary arteries by a LEFT hilar tumor as well as narrowing lingular branch however there is no endoluminal filling defect suggest acute pulmonary embolism. No pulmonary embolism within the RIGHT lung either. Port in the anterior chest wall with tip in distal SVC. Mediastinum/Nodes: No axillary or supraclavicular enlarged nodes. No mediastinal lymphadenopathy. Esophagus is patulous scratch the esophagus is fluid-filled unchanged from prior. Lungs/Pleura: LEFT perihilar mass surrounding structures of the LEFT hilum again noted. There is complete obstruction of the LEFT lobe bronchus. There is new/increased LEFT lower lobe atelectasis. There is loculated effusion at the LEFT lung base new from prior. There is nodular thickening of the pleural  space. Small RIGHT effusion.  Mild interstitial edema. Musculoskeletal: Destructive lesion involving the RIGHT scapula extending the soft tissue musculature of the RIGHT shoulder again noted. Review of the MIP images confirms the above findings. CT ABDOMEN and PELVIS FINDINGS Hepatobiliary: Round targetoid lesions within the LEFT and RIGHT hepatic lobe consistent metastasis not changed from prior. Gallbladder is distended to 4.7 cm. Common bile duct normal caliber. Pancreas: Pancreas is normal. No ductal dilatation. No pancreatic inflammation. Spleen: Normal spleen Adrenals/urinary tract: The thickening of the RIGHT adrenal gland to 11 mm consistent with metastasis. No change. Kidneys, ureters bladder normal Stomach/Bowel: Stomach is normal. Fluid within the esophagus noted. There is dilatation of the duodenum and proximal small bowel. Air-fluid levels within the small bowel. The small bowel mild distension (approximately 3.5 cm) is uniform and extends into the distal small bowel with there is caliber change leading up to the terminal ileum. There is no clear transition point identified. No pneumatosis. No portal venous gas. Vascular/Lymphatic: Abdominal aorta normal. Retroperitoneal lymph node LEFT aorta increased in size measuring 12 mm (image 34/12 Reproductive: Prostate normal Other: No free fluid. Musculoskeletal: The sclerotic lesions in the lumbar spine consistent metastasis more prominent than prior (image 80/16 involving the L3 and L4 vertebral bodies. Aggressive RIGHT scapular lesion again noted. Review of the MIP images confirms the above findings. IMPRESSION: Chest Impression: 1. No evidence acute pulmonary embolism. 2. Interval increase in RIGHT hilar malignancy constricting hilar structures including the pulmonary arteries and particularly the LEFT lower lobe bronchus. 3. LEFT lower bronchus is completely constricted with new atelectasis of the LEFT lower lobe. New loculated pleural effusion and  nodularity in the LEFT hemithorax concerning for pleural spread of metastasis. 4. Aggressive bulky lytic lesion in the RIGHT scapula extending into periscapular musculature Abdomen / Pelvis Impression: 1. SMALL BOWEL OBSTRUCTION PATTERN: New moderate dilatation small bowel involving the jejunum and moderate portion of the ileum. A caliber change in the distal ileum. No transition point identified. Differential of ileus versus early small bowel obstruction. Fluid in the duodenum is concerning for SMALL BOWEL OBSTRUCTION. 2. Stable hepatic metastasis. 3. Interval increase in adenopathy in the LEFT periaortic retroperitoneum. 4. Stable RIGHT adrenal metastasis. These results will be called to the ordering clinician or representative by the Radiologist Assistant, and communication documented in the PACS or zVision Dashboard. Electronically Signed   By: Suzy Bouchard M.D.   On: 04/22/2018 14:08   Ct Abdomen Pelvis W Contrast  Result Date: 04/22/2018 CLINICAL DATA:  Lung cancer with bony metastasis to the RIGHT shoulder and liver metastasis. EXAM: CT ANGIOGRAPHY CHEST CT ABDOMEN AND PELVIS WITH CONTRAST  TECHNIQUE: Multidetector CT imaging of the chest was performed using the standard protocol during bolus administration of intravenous contrast. Multiplanar CT image reconstructions and MIPs were obtained to evaluate the vascular anatomy. Multidetector CT imaging of the abdomen and pelvis was performed using the standard protocol during bolus administration of intravenous contrast. CONTRAST:  19mL ISOVUE-370 IOPAMIDOL (ISOVUE-370) INJECTION 76% COMPARISON:  CT 03/09/2018, 01/28/2018 FINDINGS: CTA CHEST FINDINGS Cardiovascular: There is narrowing of the branches of the LEFT lobe pulmonary arteries by a LEFT hilar tumor as well as narrowing lingular branch however there is no endoluminal filling defect suggest acute pulmonary embolism. No pulmonary embolism within the RIGHT lung either. Port in the anterior chest wall  with tip in distal SVC. Mediastinum/Nodes: No axillary or supraclavicular enlarged nodes. No mediastinal lymphadenopathy. Esophagus is patulous scratch the esophagus is fluid-filled unchanged from prior. Lungs/Pleura: LEFT perihilar mass surrounding structures of the LEFT hilum again noted. There is complete obstruction of the LEFT lobe bronchus. There is new/increased LEFT lower lobe atelectasis. There is loculated effusion at the LEFT lung base new from prior. There is nodular thickening of the pleural space. Small RIGHT effusion.  Mild interstitial edema. Musculoskeletal: Destructive lesion involving the RIGHT scapula extending the soft tissue musculature of the RIGHT shoulder again noted. Review of the MIP images confirms the above findings. CT ABDOMEN and PELVIS FINDINGS Hepatobiliary: Round targetoid lesions within the LEFT and RIGHT hepatic lobe consistent metastasis not changed from prior. Gallbladder is distended to 4.7 cm. Common bile duct normal caliber. Pancreas: Pancreas is normal. No ductal dilatation. No pancreatic inflammation. Spleen: Normal spleen Adrenals/urinary tract: The thickening of the RIGHT adrenal gland to 11 mm consistent with metastasis. No change. Kidneys, ureters bladder normal Stomach/Bowel: Stomach is normal. Fluid within the esophagus noted. There is dilatation of the duodenum and proximal small bowel. Air-fluid levels within the small bowel. The small bowel mild distension (approximately 3.5 cm) is uniform and extends into the distal small bowel with there is caliber change leading up to the terminal ileum. There is no clear transition point identified. No pneumatosis. No portal venous gas. Vascular/Lymphatic: Abdominal aorta normal. Retroperitoneal lymph node LEFT aorta increased in size measuring 12 mm (image 34/12 Reproductive: Prostate normal Other: No free fluid. Musculoskeletal: The sclerotic lesions in the lumbar spine consistent metastasis more prominent than prior (image  80/16 involving the L3 and L4 vertebral bodies. Aggressive RIGHT scapular lesion again noted. Review of the MIP images confirms the above findings. IMPRESSION: Chest Impression: 1. No evidence acute pulmonary embolism. 2. Interval increase in RIGHT hilar malignancy constricting hilar structures including the pulmonary arteries and particularly the LEFT lower lobe bronchus. 3. LEFT lower bronchus is completely constricted with new atelectasis of the LEFT lower lobe. New loculated pleural effusion and nodularity in the LEFT hemithorax concerning for pleural spread of metastasis. 4. Aggressive bulky lytic lesion in the RIGHT scapula extending into periscapular musculature Abdomen / Pelvis Impression: 1. SMALL BOWEL OBSTRUCTION PATTERN: New moderate dilatation small bowel involving the jejunum and moderate portion of the ileum. A caliber change in the distal ileum. No transition point identified. Differential of ileus versus early small bowel obstruction. Fluid in the duodenum is concerning for SMALL BOWEL OBSTRUCTION. 2. Stable hepatic metastasis. 3. Interval increase in adenopathy in the LEFT periaortic retroperitoneum. 4. Stable RIGHT adrenal metastasis. These results will be called to the ordering clinician or representative by the Radiologist Assistant, and communication documented in the PACS or zVision Dashboard. Electronically Signed   By: Suzy Bouchard  M.D.   On: 04/22/2018 14:08        Scheduled Meds: . enoxaparin (LOVENOX) injection  40 mg Subcutaneous Q24H  . mouth rinse  15 mL Mouth Rinse BID  . pantoprazole (PROTONIX) IV  40 mg Intravenous Q24H  . scopolamine  1 patch Transdermal Q72H  . simvastatin  20 mg Oral Daily  . sodium chloride flush  10-40 mL Intracatheter Q12H   Continuous Infusions: . 0.9 % NaCl with KCl 40 mEq / L 50 mL/hr (04/24/18 0103)     LOS: 3 days        Aline August, MD [facility] Pager [phone]  If 7PM-7AM, please contact  night-coverage [email or web address] Password [facility] 04/24/2018, 9:28 AM

## 2018-04-24 NOTE — Progress Notes (Addendum)
Daily Progress Note   Patient Name: Ian Roberts       Date: 04/24/2018 DOB: [birth year]  Age: 58 y.o. MRN#: [id number] Attending Physician: Aline August, MD Primary Care Physician: Neale Burly, MD Admit Date: 04/20/2018  Reason for Consultation/Follow-up: Establishing goals of care  Subjective: Patient lying in bed. Drowsy, awakens but not able to engage in [place] discussion. RN at bedside giving pain medicine after which he became nauseated. Family notes he was able to receive dilaudid yesterday without becoming nauseated.  Met separately with patient's wife and spoke with daughter via cell phone. Reviewed patient's status over the last several months.  He has not been able to walk since Christmas.  Reviewed patient's previous and current CT scans in detail with spouse per her request and answered her questions related to scans and progression of cancer.  Family expressed frustration with cancer and could not understand why patient's cancer was progressing when they were told it would go into remission. We discussed that while the hope is always that cancer will respond to treatments and improve, sometimes patient's status is made worse by treatments, and their cancer progresses despite treatments.  We discussed how radiation to brain and chemotherapy can affect a person's taste and ability to function.  We discussed the trajectory of cancer in that patient can be doing well and then suddenly not doing well, with overall decline to EOL.  Reviewed signs of decline and EOL with spouse. She pulled up an article noting signs of possible EOL- and stated she had read it and felt that she had seen signs in her husband- sleeping more during the day, loss of interest in eating, changes in personality,  changes in ability to care for self. Discussed code status- spouse and daughter agree that if patient were to not have pulse or respirations they would not want resuscitation. Both agree with recommendation of DNR order. Answered spouse's and daughter's questions related to continued aggressive medical care vs comfort care and Hospice. For now- they wish to continue aggressive medical care. They note they wish to meet with Oncology to determine if there are other possible treatments for patient's cancer.  We discussed placement of venting PEG tomorrow. Discussed that if illeus or obstruction does not resolve, that PEG will be only for venting and not useable for  feeding. They verbalized understanding.   Family also asked if visiting restrictions could be lifted for patient's 2 yr old grandchild. Unfortunately, due to influenza, cannot change visitor restrictions. We discussed that if GOC were to change for patient- comfort directed care vs continued aggressive care, patient could be moved to a setting where visitors would not be restricted.   Review of Systems  Unable to perform ROS: Acuity of condition    Length of Stay: 3  Current Medications: Scheduled Meds:  . enoxaparin (LOVENOX) injection  40 mg Subcutaneous Q24H  . mouth rinse  15 mL Mouth Rinse BID  . pantoprazole (PROTONIX) IV  40 mg Intravenous Q24H  . scopolamine  1 patch Transdermal Q72H  . simvastatin  20 mg Oral Daily  . sodium chloride flush  10-40 mL Intracatheter Q12H    Continuous Infusions: . 0.9 % NaCl with KCl 40 mEq / L 50 mL/hr (04/24/18 0103)    PRN Meds: acetaminophen **OR** acetaminophen, HYDROmorphone (DILAUDID) injection, ondansetron **OR** ondansetron (ZOFRAN) IV, prochlorperazine, promethazine, sodium chloride flush, zolpidem  Physical Exam Vitals signs and nursing note reviewed.  Eyes:     General: Scleral icterus present.  Cardiovascular:     Rate and Rhythm: Tachycardia present.  Pulmonary:      Effort: Pulmonary effort is normal.  Skin:    Coloration: Skin is jaundiced.  Neurological:     Comments: lethargic             Vital Signs: BP 102/81 (BP Location: Left Arm)   Pulse (!) 108   Temp (!) 97.4 F (36.3 C) (Oral)   Resp 18   Ht '5\' 11"'  (1.803 m)   Wt 70 kg   SpO2 97%   BMI 21.52 kg/m  SpO2: SpO2: 97 % O2 Device: O2 Device: Room Air O2 Flow Rate:    Intake/output summary:   Intake/Output Summary (Last 24 hours) at 04/24/2018 1639 Last data filed at 04/24/2018 1528 Gross per 24 hour  Intake 1653.93 ml  Output 1455 ml  Net 198.93 ml   LBM: Last BM Date: 04/19/18 Baseline Weight: Weight: 70 kg Most recent weight: Weight: 70 kg       Palliative Assessment/Data: PPS: 10%    Flowsheet Rows     Most Recent Value  Intake Tab  Referral Department  Hospitalist  Unit at Time of Referral  Med/Surg Unit  Palliative Care Primary Diagnosis  Cancer  Palliative Care Type  New Palliative care  Date first seen by Palliative Care  04/21/18  Clinical Assessment  Palliative Performance Scale Score  10%  Psychosocial & Spiritual Assessment  Palliative Care Outcomes  Patient/Family meeting held?  Yes  Who was at the meeting?  patient, wife, daughter  Palliative Care Outcomes  Clarified goals of care, Provided end of life care assistance, Provided psychosocial or spiritual support, ACP counseling assistance, Improved pain interventions, Improved non-pain symptom therapy      Patient Active Problem List   Diagnosis Date Noted  . Protein-calorie malnutrition, severe 04/21/2018  . Pressure injury of skin 04/21/2018  . Palliative care by specialist   . Weakness   . Dehydration   . Abdominal pain   . Hypokalemia 04/20/2018  . Goals of care, counseling/discussion 02/22/2018  . Primary malignant neoplasm of lung metastatic to other site ([place]) 01/31/2018  . DYSLIPIDEMIA 06/05/2008  . HYPERTENSION, BENIGN ESSENTIAL 06/05/2008  . ACUT MI OTH INF WALL SUBSQT EPIS CARE  06/05/2008  . CORONARY ATHEROSCLEROSIS NATIVE CORONARY ARTERY 06/05/2008  Palliative Care Assessment & Plan   Patient Profile: 58 y.o.malewith past medical history of left lung adenocarcinoma with metastases to brain, liver, right scapula , CVA 2017, STEMI, HTN, CAD, necrotizing perirectal abscessadmitted on 1/29/2020when scheduled for PEG tube placement but found to be severely dehydrated and hypokalemic. IVF initiated. Diagnosed with metastatic lung cancer in November 2019 s/p two rounds of chemotherapy and whole brain radiation. This admission, patient with intractable nausea and vomiting. Abdominal xray pending this afternoon. Palliative medicine consultation for goals of care.  Assessment/Recommendations/Plan   DNR  Placement of venting PEG tomorrow by IR  PMT will continue to follow  Goals of Care and Additional Recommendations:  Limitations on Scope of Treatment: Full Scope Treatment  Code Status:  DNR  Prognosis:   < 6 months d/t advanced cancer progressing through treatment, significant changes in cognitive, functional and nutritional status  Discharge Planning:  To Be Determined  Care plan was discussed with patient and daughter.  Thank you for allowing the Palliative Medicine Team to assist in the care of this patient.   Time In: 1230 Time Out: 1400 Total Time 90 minutes Prolonged Time Billed Yes      Greater than 50%  of this time was spent counseling and coordinating care related to the above assessment and plan.  Mariana Kaufman, AGNP-C Palliative Medicine   Please contact Palliative Medicine Team phone at [phone] for questions and concerns.

## 2018-04-24 NOTE — Evaluation (Signed)
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  He was supposed to have PEG tube placement as an outpatient on 04/20/2018 but got admitted because of severe dehydration and hypokalemia.  Clinical Impression  Pt admitted with/for dehydration and hypokalemia.  Pt now needing a light mod to moderate assist for basic mobility.  Pt currently limited functionally due to the problems listed below.  (see problems list.)  Pt will benefit from PT to maximize function and safety to be able to get home safely with available assist.     Follow Up Recommendations Home health PT;Other (comment)    Equipment Recommendations  None recommended by PT    Recommendations for Other Services       Precautions / Restrictions Precautions Precautions: Fall      Mobility  Bed Mobility Overal bed mobility: Needs Assistance Bed Mobility: Supine to Sit;Sit to Supine     Supine to sit: Mod assist Sit to supine: Min assist   General bed mobility comments: used L UE and truncal assist to upright sitting and scooting with L UE  Transfers Overall transfer level: Needs assistance Equipment used: None Transfers: Sit to/from [facility] Sit to Stand: Mod assist Stand pivot transfers: Mod assist       General transfer comment: light moderat assist with face to face transfer to/from [facility] and full upright sit to stand  Ambulation/Gait                Stairs            Wheelchair Mobility    Modified Rankin (Stroke Patients Only)       Balance Overall balance assessment: Needs assistance Sitting-balance support: No upper extremity supported;Single extremity supported Sitting balance-Leahy  Scale: Fair     Standing balance support: Single extremity supported Standing balance-Leahy Scale: Poor Standing balance comment: needs external support                             Pertinent Vitals/Pain Pain Assessment: Faces Faces Pain Scale: Hurts little more Pain Location: R shoulder Pain Descriptors / Indicators: Sore;Constant;Discomfort;Grimacing Pain Intervention(s): Limited activity within patient's tolerance;Monitored during session    [place] expects to be discharged to:: Private residence Living Arrangements: Spouse/significant other Available Help at Discharge: Family;Available 24 hours/day Type of Home: House Home Access: Ramped entrance;Stairs to enter Entrance Stairs-Rails: None Entrance Stairs-Number of Steps: 9 Home Layout: Able to live on main level with bedroom/bathroom Home Equipment: Hospital bed;Shower seat;Bedside commode;Walker - 2 wheels;Grab bars - tub/shower;Grab bars - toilet;Wheelchair - manual      Prior Function Level of Independence: Needs assistance   Gait / Transfers Assistance Needed: modA from family for transfer;utilized wheelchair;has not ambulated since Apr 07, 2018  ADL's / Homemaking Assistance Needed: family provided assistance for ADL/IADL         Hand Dominance   Dominant Hand: Left    Extremity/Trunk Assessment   Upper Extremity Assessment Upper Extremity Assessment: LUE deficits/detail LUE Deficits / Details: pt able to use L UE functionally to help boost up toward [facility]    Lower Extremity Assessment Lower Extremity Assessment: Generalized weakness(functional for weak-knned w/bearing and transfer)       Communication  Communication: No difficulties  Cognition Arousal/Alertness: Awake/alert Behavior During Therapy: Flat affect Overall Cognitive Status: Difficult to assess                                        General Comments      Exercises     Assessment/Plan     PT Assessment Patient needs continued PT services  PT Problem List Decreased strength;Decreased activity tolerance;Decreased balance;Decreased mobility;Decreased knowledge of use of DME;Pain       PT Treatment Interventions Gait training;Functional mobility training;DME instruction;Therapeutic activities;Balance training;Patient/family education    PT Goals (Current goals can be found in the Care Plan section)  Acute Rehab PT Goals Patient Stated Goal: to walk again PT Goal Formulation: With patient Time For Goal Achievement: 05/08/18 Potential to Achieve Goals: Fair    Frequency Min 3X/week   Barriers to discharge        Co-evaluation               AM-PAC PT "6 Clicks" Mobility  Outcome Measure Help needed turning from your back to your side while in a flat bed without using bedrails?: A Little Help needed moving from lying on your back to sitting on the side of a flat bed without using bedrails?: A Lot Help needed moving to and from a bed to a chair (including a wheelchair)?: A Lot Help needed standing up from a chair using your arms (e.g., wheelchair or bedside chair)?: A Little Help needed to walk in hospital room?: A Lot Help needed climbing 3-5 steps with a railing? : Total 6 Click Score: 13    End of Session   Activity Tolerance: Patient tolerated treatment well Patient left: in bed;with call bell/phone within reach;with family/visitor present;with bed alarm set Nurse Communication: Mobility status PT Visit Diagnosis: Unsteadiness on feet (R26.81);Muscle weakness (generalized) (M62.81);Pain Pain - Right/Left: Right Pain - part of body: Shoulder    Time: 9379-0240 PT Time Calculation (min) (ACUTE ONLY): 21 min   Charges:   PT Evaluation $PT Eval Moderate Complexity: 1 Mod          04/24/2018  Donnella Sham, PT Acute Rehabilitation Services [phone]  (pager) [phone]  (office)  Tessie Fass Kayvion Arneson 04/24/2018, 5:54 PM

## 2018-04-24 NOTE — Progress Notes (Signed)
   04/24/18 1447 04/24/18 1455  Urine Characteristics  Urinary Interventions Bladder scan Intermittent/Straight cath  Bladder Scan Volume (mL) 866 mL  --   Intermittent/Straight Cath (mL)  --  975 mL  Intermittent Catheter Size  --  14

## 2018-04-25 ENCOUNTER — Inpatient Hospital Stay (HOSPITAL_COMMUNITY): Payer: Medicaid Other

## 2018-04-25 ENCOUNTER — Encounter (HOSPITAL_COMMUNITY): Payer: Self-pay | Admitting: Interventional Radiology

## 2018-04-25 HISTORY — PX: IR FLUORO RM 30-60 MIN: IMG2384

## 2018-04-25 LAB — PROTIME-INR
INR: 1.58
Prothrombin Time: 18.7 seconds — ABNORMAL HIGH (ref 11.4–15.2)

## 2018-04-25 LAB — CBC
HCT: 31.7 % — ABNORMAL LOW (ref 39.0–52.0)
HEMOGLOBIN: 9.9 g/dL — AB (ref 13.0–17.0)
MCH: 27.3 pg (ref 26.0–34.0)
MCHC: 31.2 g/dL (ref 30.0–36.0)
MCV: 87.3 fL (ref 80.0–100.0)
Platelets: 260 10*3/uL (ref 150–400)
RBC: 3.63 MIL/uL — ABNORMAL LOW (ref 4.22–5.81)
RDW: 23.9 % — ABNORMAL HIGH (ref 11.5–15.5)
WBC: 13 10*3/uL — ABNORMAL HIGH (ref 4.0–10.5)
nRBC: 0.2 % (ref 0.0–0.2)

## 2018-04-25 LAB — BASIC METABOLIC PANEL
Anion gap: 10 (ref 5–15)
BUN: 14 mg/dL (ref 6–20)
CHLORIDE: 112 mmol/L — AB (ref 98–111)
CO2: 19 mmol/L — ABNORMAL LOW (ref 22–32)
Calcium: 7.5 mg/dL — ABNORMAL LOW (ref 8.9–10.3)
Creatinine, Ser: 0.73 mg/dL (ref 0.61–1.24)
GFR calc Af Amer: >60 mL/min (ref >60)
GFR calc non Af Amer: >60 mL/min (ref >60)
Glucose, Bld: 69 mg/dL — ABNORMAL LOW (ref 70–99)
Potassium: 3.5 mmol/L (ref 3.5–5.1)
Sodium: 141 mmol/L (ref 135–145)

## 2018-04-25 LAB — MAGNESIUM: Magnesium: 1.5 mg/dL — ABNORMAL LOW (ref 1.7–2.4)

## 2018-04-25 MED ORDER — IOPAMIDOL (ISOVUE-300) INJECTION 61%
INTRAVENOUS | Status: AC
  Filled 2018-04-25: qty 50

## 2018-04-25 MED ORDER — MIDAZOLAM HCL 2 MG/2ML IJ SOLN
INTRAMUSCULAR | Status: AC
  Filled 2018-04-25: qty 2

## 2018-04-25 MED ORDER — FENTANYL CITRATE (PF) 100 MCG/2ML IJ SOLN
INTRAMUSCULAR | Status: AC
  Filled 2018-04-25: qty 2

## 2018-04-25 MED ORDER — CEFAZOLIN SODIUM-DEXTROSE 2-4 GM/100ML-% IV SOLN
2.0000 g | INTRAVENOUS | Status: AC
  Administered 2018-04-25: 2 g via INTRAVENOUS

## 2018-04-25 MED ORDER — ONDANSETRON HCL 4 MG/2ML IJ SOLN
INTRAMUSCULAR | Status: AC | PRN
  Administered 2018-04-25: 4 mg via INTRAVENOUS

## 2018-04-25 MED ORDER — LIDOCAINE HCL 1 % IJ SOLN
INTRAMUSCULAR | Status: AC
  Filled 2018-04-25: qty 20

## 2018-04-25 MED ORDER — ONDANSETRON HCL 4 MG/2ML IJ SOLN
INTRAMUSCULAR | Status: AC
  Filled 2018-04-25: qty 2

## 2018-04-25 MED ORDER — HYDROMORPHONE HCL 1 MG/ML IJ SOLN
0.5000 mg | INTRAMUSCULAR | Status: DC | PRN
  Administered 2018-04-25 – 2018-04-30 (×19): 0.5 mg via INTRAVENOUS
  Filled 2018-04-25 (×18): qty 0.5

## 2018-04-25 MED ORDER — KCL IN DEXTROSE-NACL 40-5-0.45 MEQ/L-%-% IV SOLN
INTRAVENOUS | Status: DC
  Administered 2018-04-25 – 2018-04-30 (×6): via INTRAVENOUS
  Filled 2018-04-25 (×7): qty 1000

## 2018-04-25 MED ORDER — GLUCAGON HCL RDNA (DIAGNOSTIC) 1 MG IJ SOLR
INTRAMUSCULAR | Status: AC
  Filled 2018-04-25: qty 1

## 2018-04-25 MED ORDER — MAGNESIUM SULFATE 2 GM/50ML IV SOLN
2.0000 g | Freq: Once | INTRAVENOUS | Status: AC
  Administered 2018-04-25: 2 g via INTRAVENOUS
  Filled 2018-04-25: qty 50

## 2018-04-25 MED ORDER — CEFAZOLIN SODIUM-DEXTROSE 2-4 GM/100ML-% IV SOLN
INTRAVENOUS | Status: AC
  Filled 2018-04-25: qty 100

## 2018-04-25 MED ORDER — ENOXAPARIN SODIUM 40 MG/0.4ML ~~LOC~~ SOLN
40.0000 mg | SUBCUTANEOUS | Status: DC
  Administered 2018-04-26 – 2018-05-01 (×6): 40 mg via SUBCUTANEOUS
  Filled 2018-04-25 (×6): qty 0.4

## 2018-04-25 NOTE — Sedation Documentation (Signed)
Report called to patients RN on 5W.

## 2018-04-25 NOTE — Progress Notes (Signed)
Patient ID: Ian Roberts, male   DOB: 07-19-1960, 58 y.o.   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  He was supposed to have PEG tube placement as an outpatient on 04/20/2018 but got admitted because of severe dehydration and hypokalemia.  He was started on IV fluids. He was found to have bowel obstruction versus ileus, general surgery was consulted. palliative care was also consulted.  Assessment & Plan:    Active Problems:   Primary malignant neoplasm of lung metastatic to other site [facility])   Hypokalemia   Protein-calorie malnutrition, severe   Pressure injury of skin   Palliative care by specialist   Weakness   Dehydration   Abdominal pain   Advanced care planning/counseling discussion   Metastatic cancer ([place])   SBO (small bowel obstruction) (H[facility])  Small bowel obstruction versus ileus presenting with intractable nausea and vomiting -Vomiting has improved.  Continue antiemetics. -General surgery has also been consulted. -Abdominal x-ray from 04/24/2018 still showed dilated bowel loops.  Will follow further general surgery recommendations.  Continue n.p.o.  Patient is passing gas but has had very minimal stool.  Dehydration -From poor oral intake.  Change IV fluid to D5 half normal saline at 50 cc an hour with supplemental potassium  Chronic anemia of chronic disease -Probably from cancer.  Status post 2 units of packed red cell transfusion on 04/23/2018 for hemoglobin of 6.5.  Hemoglobin is 9.9 today.  Monitor.  No signs of overt bleeding  Hypokalemia -Replace.  Repeat a.m. labs  Hypomagnesemia -Replace.  Repeat a.m. labs  Failure to thrive with decreased p.o. intake/hypoalbuminemia -From  metastatic cancer -Patient was scheduled for PEG tube placement on 04/20/2018 as an outpatient but was postponed due to dehydration and hypokalemia.  -IR has been consulted for G-tube placement.   -IV fluids plan as above.  Metastatic lung cancer with metastases to brain, liver and shoulder -Currently on chemotherapy as an outpatient and follows up with oncology at [facility] -Overall prognosis is very poor.  Palliative care following.  CT of chest abdomen and pelvis shows increasing size of lung malignancy along with some stable and some worsening metastases.  Outpatient follow-up with oncology. -CODE STATUS has been changed to DNR by palliative care discussion with family.  Chronic pain -Continue IV Dilaudid for now.  Hypertension -Blood pressure on hold for now.  ACE inhibitor on hold  Healing full-thickness wound/pressure ulcer on sacrum/buttocks with pink dry scar tissue and patchy areas -Wound care as per wound care nurse recommendations.   DVT prophylaxis: Lovenox Code Status: Full Family Communication: Wife at bedside Disposition Plan: Depends on clinical outcome  Consultants: Palliative care/IR/general surgery Procedures: None  Antimicrobials: None   Subjective: Patient seen and examined at bedside.  He is a poor historian.  He is sleepy, wakes up slightly, answers some questions.  He is passing some gas.  No overnight fever or vomiting.  Has intermittent nausea. objective: Vitals:   04/24/18 1533 04/24/18 1542 04/24/18 2133 04/25/18 0446  BP: 102/81  119/78 111/83  Pulse: (!) 111 (!) 108 98 (!) 110  Resp: 18  18 18   Temp: (!) 97.4 F (36.3 C)  97.7 F (36.5 C) 97.7 F (36.5 C)  TempSrc: Oral  SpO2: 97%  98% 99%  Weight:      Height:        Intake/Output Summary (Last 24 hours) at 04/25/2018 0915 Last data filed at 04/25/2018 0630 Gross per 24 hour  Intake 1154.67 ml  Output 1700 ml  Net -545.33 ml   Filed Weights   04/20/18 1105  Weight: 70  kg    Examination:  General exam: Appears chronically ill.  Poor historian.  No  distress.  Sleepy, wakes up only very slightly and answers some questions  respiratory system: Bilateral decreased breath sounds at bases, no wheezing Cardiovascular system: S1 & S2 heard, slightly tachycardic Gastrointestinal system: Abdomen is nondistended, soft and nontender.  Bowel sounds sluggish Extremities: No cyanosis, edema    Data Reviewed: I have personally reviewed following labs and imaging studies  CBC: Recent Labs  Lab 04/21/18 0353 04/22/18 0414 04/23/18 0324 04/23/18 2119 04/24/18 0401 04/25/18 0418  WBC 9.1 11.5* 11.7*  --  11.4* 13.0*  NEUTROABS  --  10.2* 10.4*  --  9.8*  --   HGB 7.6* 7.0* 6.5* 9.3* 9.5* 9.9*  HCT 23.2* 22.2* 21.3* 29.6* 30.4* 31.7*  MCV 88.5 92.1 95.1  --  87.1 87.3  PLT 466* 425* 366  --  295 191   Basic Metabolic Panel: Recent Labs  Lab 04/21/18 0353 04/22/18 0414 04/23/18 0324 04/24/18 0401 04/25/18 0418  NA 132* 139 139 138 141  K 3.5 2.8* 4.3 3.6 3.5  CL 99 106 111 110 112*  CO2 19* 19* 17* 18* 19*  GLUCOSE 84 150* 100* 78 69*  BUN 18 19 16 13 14   CREATININE 0.90 0.86 0.70 0.64 0.73  CALCIUM 7.6* 7.6* 7.6* 7.6* 7.5*  MG  --  1.7 1.5* 1.7 1.5*   GFR: Estimated Creatinine Clearance: 102.1 mL/min (by C-G formula based on SCr of 0.73 mg/dL). Liver Function Tests: Recent Labs  Lab 04/20/18 1008 04/22/18 0414  AST 17 11*  ALT 15 10  ALKPHOS 231* 207*  BILITOT 2.1* 1.9*  PROT 7.4 6.1*  ALBUMIN 2.1* 1.8*   No results for input(s): LIPASE, AMYLASE in the last 168 hours. No results for input(s): AMMONIA in the last 168 hours. Coagulation Profile: Recent Labs  Lab 04/20/18 1035 04/25/18 0418  INR 1.47 1.58   Cardiac Enzymes: No results for input(s): CKTOTAL, CKMB, CKMBINDEX, TROPONINI in the last 168 hours. BNP (last 3 results) No results for input(s): PROBNP in the last 8760 hours. HbA1C: No results for input(s): HGBA1C in the  last 72 hours. CBG: No results for input(s): GLUCAP in the last 168 hours. Lipid Profile: No results for input(s): CHOL, HDL, LDLCALC, TRIG, CHOLHDL, LDLDIRECT in the last 72 hours. Thyroid Function Tests: No results for input(s): TSH, T4TOTAL, FREET4, T3FREE, THYROIDAB in the last 72 hours. Anemia Panel: No results for input(s): VITAMINB12, FOLATE, FERRITIN, TIBC, IRON, RETICCTPCT in the last 72 hours. Sepsis Labs: No results for input(s): PROCALCITON, LATICACIDVEN in the last 168 hours.  No results found for this or any previous visit (from the past 240 hour(s)).       Radiology Studies: Dg Abd 2 Views  Result Date: 04/24/2018 CLINICAL DATA:  Nausea, vomiting, small bowel obstruction. EXAM: ABDOMEN - 2 VIEW COMPARISON:  Radiographs of April 21, 2018. CT scan of April 22, 2018. FINDINGS: Mildly dilated small bowel loops are noted concerning for distal small bowel obstruction or ileus. No colonic dilatation is noted. Stool is noted in the rectum. There is no evidence of free air.  No radio-opaque calculi or other significant radiographic abnormality is seen. IMPRESSION: Mildly dilated small bowel loops are noted concerning for distal small bowel obstruction or ileus. Electronically Signed   By: Marijo Conception, M.D.   On: 04/24/2018 12:54        Scheduled Meds: . [START ON 04/26/2018] enoxaparin (LOVENOX) injection  40 mg Subcutaneous Q24H  . mouth rinse  15 mL Mouth Rinse BID  . pantoprazole (PROTONIX) IV  40 mg Intravenous Q24H  . scopolamine  1 patch Transdermal Q72H  . simvastatin  20 mg Oral Daily  . sodium chloride flush  10-40 mL Intracatheter Q12H   Continuous Infusions: .  ceFAZolin (ANCEF) IV    . dextrose 5 % and 0.45 % NaCl with KCl 40 mEq/L    . magnesium sulfate 1 - 4 g bolus IVPB       LOS: 4 days        Aline August, MD [facility] Pager [phone]  If 7PM-7AM, please contact night-coverage [email or web address] Password [email or web address] 04/25/2018, 9:15 AM

## 2018-04-25 NOTE — Procedures (Signed)
Pre procedural Dx: Recurrent enteric obstruction; Request for G-tube placement for ventilatory purposes. Post procedural Dx: Same  Once placed on IR table for G-tube placement, pt refused the procedure.   This was discussed at great length with both the patient the patient's wife, however ultimately, the patient, who (per his wife) maintain medical decision making capacity has elected NOT to undergo G-tube placement for ventilatory purposes at this time.   No procedure attempted.  Please re-order procedure if/when pt changes his mind.  In the meantime, would consider nasogastric tube placement and suction to ensure pt will indeed benefit from a venting G-tube.   EBL: None Complications: None immediate.  Ronny Bacon, MD Pager #: [phone]

## 2018-04-25 NOTE — Progress Notes (Signed)
Performed in and out cath after previous bladder scan and had 634ml return of amber colored urine. Pt tolerated well.

## 2018-04-25 NOTE — Progress Notes (Signed)
[facility]/Trauma Progress Note      Assessment/Plan HTN HLD CAD, Hx of STEMI in 2010 s/p stent placement Hx of CVA Metastatic adenocarcinoma of lung - mets to brain, liver, right clavicle  Severe protein calorie malnutrition/Failure to thrive SBO - abdominal film today showed improved dilatation of small bowel - Pt refused IR PEG today but unable to tell me why - palliative team following - No NGT at this time with improved AXR and wife states copious flatus - we will follow, repeat AXR in am   LOS: 4 days    Subjective: CC: Partial SBO vs ileus  Pt will not tell me why he refused the PEG today. He told me not to order the NGT. Pt was not answering questions and seems more lethargic today. Wife at bedside and I explained if he has a SBO and vomits he could aspirate. She expressed understanding. Unsure if pt understood.   Objective: Vital signs in last 24 hours: Temp:  [97.4 F (36.3 C)-97.7 F (36.5 C)] 97.7 F (36.5 C) (02/03 0446) Pulse Rate:  [98-111] 104 (02/03 1359) Resp:  [12-18] 18 (02/03 1359) BP: (102-119)/(71-86) 105/71 (02/03 1359) SpO2:  [97 %-100 %] 98 % (02/03 1359) Last BM Date: 04/19/18  Intake/Output from previous day: 02/02 0701 - 02/03 0700 In: 1154.7 [I.V.:1154.7] Out: 1700 [Urine:1700] Intake/Output this shift: Total I/O In: -  Out: 975 [Urine:725; Emesis/NG output:250]  PE: Gen:  Alert, lethargic, ill appearing Pulm:  Rate and effort normal Abd: Soft, ND, +BS, mild TTP in RLQ without guarding, no peritonitis  Skin: warm and dry   Anti-infectives: Anti-infectives (From admission, onward)   Start     Dose/Rate Route Frequency Ordered Stop   04/25/18 1250  ceFAZolin (ANCEF) 2-4 GM/100ML-% IVPB    Note to Pharmacy:  Manuela Neptune   : cabinet override      04/25/18 1250 04/26/18 0059   04/25/18 0615  ceFAZolin (ANCEF) IVPB 2g/100 mL premix     2 g 200 mL/hr over 30 Minutes Intravenous To Radiology 04/25/18 0606  04/25/18 1329      Lab Results:  Recent Labs    04/24/18 0401 04/25/18 0418  WBC 11.4* 13.0*  HGB 9.5* 9.9*  HCT 30.4* 31.7*  PLT 295 260   BMET Recent Labs    04/24/18 0401 04/25/18 0418  NA 138 141  K 3.6 3.5  CL 110 112*  CO2 18* 19*  GLUCOSE 78 69*  BUN 13 14  CREATININE 0.64 0.73  CALCIUM 7.6* 7.5*   PT/INR Recent Labs    04/25/18 0418  LABPROT 18.7*  INR 1.58   CMP     Component Value Date/Time   NA 141 04/25/2018 0418   K 3.5 04/25/2018 0418   CL 112 (H) 04/25/2018 0418   CO2 19 (L) 04/25/2018 0418   GLUCOSE 69 (L) 04/25/2018 0418   BUN 14 04/25/2018 0418   CREATININE 0.73 04/25/2018 0418   CALCIUM 7.5 (L) 04/25/2018 0418   PROT 6.1 (L) 04/22/2018 0414   ALBUMIN 1.8 (L) 04/22/2018 0414   AST 11 (L) 04/22/2018 0414   ALT 10 04/22/2018 0414   ALKPHOS 207 (H) 04/22/2018 0414   BILITOT 1.9 (H) 04/22/2018 0414   GFRNONAA >60 04/25/2018 0418   GFRAA >60 04/25/2018 0418   Lipase     Component Value Date/Time   LIPASE 25 03/09/2018 0013    Studies/Results: Ir Fluoro Rm 30-60 Min  Result Date: 04/25/2018 INDICATION: History metastatic lung cancer, now with  chronic enteric obstructive symptoms. Request made for placement of a percutaneous gastrostomy tube for venting purposes. EXAM: IR FLUORO RM 0-60 MIN COMPARISON:  None. MEDICATIONS: Zofran 4 mg IV CONTRAST:  None ANESTHESIA/SEDATION: None FLUOROSCOPY TIME:  None COMPLICATIONS: None immediate. PROCEDURE: Informed written consent was obtained from the patient following explanation of the procedure, risks, benefits and alternatives. Despite obtaining consent from the patient and the patient's wife prior to the procedure, once placed supine on the fluoroscopy table, the patient experienced nausea and several episodes of vomiting and despite being administered Zofran, ultimately elected not to pursue gastrostomy tube placement. This was discussed at great length with both the patient the patient's wife  however ultimately, the patient, who (per his wife) maintains medical decision making capacity, refuses to undergo gastrostomy tube placement at the present time. No procedure attempted. FINDINGS: Patient refusal as detailed above. IMPRESSION: Patient refused percutaneous gastrostomy tube placement. No procedure attempted. Electronically Signed   By: Sandi Mariscal M.D.   On: 04/25/2018 13:52   Dg Abd 2 Views  Result Date: 04/24/2018 CLINICAL DATA:  Nausea, vomiting, small bowel obstruction. EXAM: ABDOMEN - 2 VIEW COMPARISON:  Radiographs of April 21, 2018. CT scan of April 22, 2018. FINDINGS: Mildly dilated small bowel loops are noted concerning for distal small bowel obstruction or ileus. No colonic dilatation is noted. Stool is noted in the rectum. There is no evidence of free air. No radio-opaque calculi or other significant radiographic abnormality is seen. IMPRESSION: Mildly dilated small bowel loops are noted concerning for distal small bowel obstruction or ileus. Electronically Signed   By: Marijo Conception, M.D.   On: 04/24/2018 12:54   Dg Abd Portable 1v  Result Date: 04/25/2018 CLINICAL DATA:  Small bowel obstruction. EXAM: PORTABLE ABDOMEN - 1 VIEW COMPARISON:  Radiographs of April 21, 2018. FINDINGS: Small bowel dilatation noted on prior exam is significantly improved, with residual mildly dilated loop seen in the left upper quadrant, most consistent with improving ileus. Phleboliths are noted in the pelvis. No nephrolithiasis is noted. IMPRESSION: Significantly improved small bowel dilatation is noted most consistent with improving ileus. Electronically Signed   By: Marijo Conception, M.D.   On: 04/25/2018 14:51      Kalman Drape , [facility] 04/25/2018, 3:32 PM  Pager: [phone] Mon-Wed, Friday 7:00am-4:30pm Thurs 7am-11:30am  Consults: [phone]

## 2018-04-25 NOTE — Progress Notes (Signed)
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  He was supposed to have PEG tube placement as an outpatient on 04/20/2018 but got admitted because of severe dehydration and hypokalemia.   OT comments  Upon arrival pt just waking up, agreeable to therapy session. Pt currently requires modA for stand-pivot transfers and completes ADL following setup assist. Pt required increased time to follow multi-step commands, pt's wife reports pt's processing speed is slower than baseline.  Due to functional limitations as listed below (see OT problem list) pt will continue to benefit from skilled OT services to maximize independence with ADL and functional mobility. Will continue to follow acutely and progress pt as appropriate.    Follow Up Recommendations  Home health OT;Supervision/Assistance - 24 hour    Equipment Recommendations  None recommended by OT    Recommendations for Other Services      Precautions / Restrictions Precautions Precautions: Fall Restrictions Weight Bearing Restrictions: No       Mobility Bed Mobility Overal bed mobility: Needs Assistance Bed Mobility: Supine to Sit;Sit to Supine     Supine to sit: Mod assist Sit to supine: Min assist   General bed mobility comments: modA to progress to upright seated EOB; pt used LUE to assist progression and scooting to EOB  Transfers Overall transfer level: Needs assistance Equipment used: None Transfers: Sit to/from [facility] Sit to Stand: Mod assist Stand pivot transfers: Mod assist       General transfer comment: modA with face to face transfer to recliner from EOB    Balance Overall balance assessment: Needs  assistance Sitting-balance support: Single extremity supported;Feet supported Sitting balance-Leahy Scale: Fair   Postural control: Posterior lean Standing balance support: Bilateral upper extremity supported Standing balance-Leahy Scale: Poor Standing balance comment: needs external support                           ADL either performed or assessed with clinical judgement   ADL Overall ADL's : Needs assistance/impaired     Grooming: Oral care;Brushing hair;Wash/dry face;Set up;Sitting;Bed level Grooming Details (indicate cue type and reason): pt applied toothpaste to toothbrush after setupA from wife, pt did not complete oral care due to NPO status;     Lower Body Bathing: Maximal assistance Lower Body Bathing Details (indicate cue type and reason): wife was applying lotion to pt's legs upon arrival          Toilet Transfer: Stand-pivot;Moderate assistance;Maximal assistance Toilet Transfer Details (indicate cue type and reason): simulated stand-pivot transfer from EOB to recliner         Functional mobility during ADLs: Moderate assistance;Maximal assistance       Vision       Perception     Praxis      Cognition Arousal/Alertness: Awake/alert Behavior During Therapy: Flat affect Overall Cognitive Status: Impaired/Different from baseline Area of Impairment: Following commands;Attention                   Current Attention Level: Alternating   Following Commands: Follows multi-step commands with increased time       General Comments: Pt demonstrated slow processing throughout session requiring extended time to follow commands;wife reports this processing speed is slower  than baseline        Exercises     Shoulder Instructions       General Comments pt's wife present during session, educated wife on safe and appropriate use of gait belt to assist with transfers.     Pertinent Vitals/ Pain       Pain Assessment: Faces Faces Pain  Scale: Hurts even more Pain Location: R shoulder, sacrum Pain Descriptors / Indicators: Sore;Constant;Discomfort;Grimacing Pain Intervention(s): Monitored during session;Limited activity within patient's tolerance;Repositioned  Home Living                                          Prior Functioning/Environment              Frequency  Min 2X/week        Progress Toward Goals  OT Goals(current goals can now be found in the care plan section)  Progress towards OT goals: Progressing toward goals  Acute Rehab OT Goals Patient Stated Goal: to walk again OT Goal Formulation: With patient/family Time For Goal Achievement: 05/06/18 Potential to Achieve Goals: Good ADL Goals Pt Will Perform Grooming: with modified independence;sitting Pt Will Perform Lower Body Dressing: with min assist Pt Will Transfer to Toilet: stand pivot transfer;with min assist Pt Will Perform Tub/Shower Transfer: Stand pivot transfer;with min assist  Plan Discharge plan remains appropriate    Co-evaluation    PT/OT/SLP Co-Evaluation/Treatment: Yes Reason for Co-Treatment: For patient/therapist safety;To address functional/ADL transfers PT goals addressed during session: Mobility/safety with mobility OT goals addressed during session: ADL's and self-care      AM-PAC OT "6 Clicks" Daily Activity     Outcome Measure   Help from another person eating meals?: None Help from another person taking care of personal grooming?: A Little Help from another person toileting, which includes using toliet, bedpan, or urinal?: A Lot Help from another person bathing (including washing, rinsing, drying)?: A Lot Help from another person to put on and taking off regular upper body clothing?: A Lot Help from another person to put on and taking off regular lower body clothing?: A Lot 6 Click Score: 15    End of Session Equipment Utilized During Treatment: Gait belt  OT Visit Diagnosis:  Unsteadiness on feet (R26.81);Other abnormalities of gait and mobility (R26.89);Muscle weakness (generalized) (M62.81);Other symptoms and signs involving cognitive function   Activity Tolerance Patient limited by pain   Patient Left in chair;with call bell/phone within reach;with chair alarm set;with family/visitor present   Nurse Communication Mobility status        Time: 3299-2426 OT Time Calculation (min): 31 min  Charges: OT General Charges $OT Visit: 1 Visit OT Treatments $Self Care/Home Management : 8-22 mins  Dorinda Hill OTR/L [place] Office: [place] 04/25/2018, 1:37 PM

## 2018-04-25 NOTE — Sedation Documentation (Signed)
Patient no longer wishes to have procedure completes. Risks vs benefits present to patient by MD and this RN. Family notified and procedure canceled. No sedation given.

## 2018-04-25 NOTE — Sedation Documentation (Signed)
Patient has had two episodes of emesis. 250 cc of brownish emesis in suction cannister

## 2018-04-25 NOTE — Progress Notes (Signed)
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  He was supposed to have PEG tube placement as an outpatient on 04/20/2018 but got admitted because of severe dehydration and hypokalemia.    PT Comments    Pt was seen for mobility and noted his strength in RLE is limitation for standing and pivoting to the chair.  Even so, pt is accustomed to the modification for this effort, and can assist well with the activity using R knee as pt of support by PT.  Follow acutely, work on positioning to allow sitting OOB better, with protection of pillows and with minimizing his time in the chair to match tolerance.  Pt is still recommended to home as he is close to PLOF.  May be in need of SNF care eventually, if he becomes more dependent.   Follow Up Recommendations  Home health PT;Other (comment)     Equipment Recommendations  None recommended by PT    Recommendations for Other Services       Precautions / Restrictions Precautions Precautions: Fall Restrictions Weight Bearing Restrictions: No    Mobility  Bed Mobility Overal bed mobility: Needs Assistance Bed Mobility: Supine to Sit     Supine to sit: Mod assist     General bed mobility comments: mod assist to support trunk and then assist him to finish scooting to EOB  Transfers Overall transfer level: Needs assistance Equipment used: None Transfers: Sit to/from Stand;Stand Pivot Transfers Sit to Stand: Max assist;Mod assist Stand pivot transfers: Mod assist       General transfer comment: requires max if supporting L knee and mod for RLE support in standing  Ambulation/Gait             General Gait Details: unable   Stairs              Wheelchair Mobility    Modified Rankin (Stroke Patients Only)       Balance Overall balance assessment: Needs assistance Sitting-balance support: Feet supported(trunk support from PT) Sitting balance-Leahy Scale: Fair Sitting balance - Comments: fair once set   Standing balance support: Bilateral upper extremity supported;During functional activity Standing balance-Leahy Scale: Poor Standing balance comment: support on one knee to get upright, better with R knee support                            Cognition Arousal/Alertness: Awake/alert Behavior During Therapy: Flat affect Overall Cognitive Status: Impaired/Different from baseline Area of Impairment: Following commands;Attention;Awareness;Problem solving                   Current Attention Level: Selective   Following Commands: Follows one step commands with increased time   Awareness: Intellectual Problem Solving: Decreased initiation;Difficulty sequencing;Slow processing;Requires verbal cues;Requires tactile cues        Exercises      General Comments General comments (skin integrity, edema, etc.): wife in attendance to share information      Pertinent Vitals/Pain Pain Assessment: Faces Faces Pain Scale: Hurts even more Pain Location: Sacral wounds Pain Descriptors / Indicators: Grimacing Pain Intervention(s): Monitored during session;Other (comment)(repositioned)    Home Living  Prior Function            PT Goals (current goals can now be found in the care plan section) Acute Rehab PT Goals Patient Stated Goal: walk again Progress towards PT goals: Progressing toward goals    Frequency    Min 3X/week      PT Plan Current plan remains appropriate    Co-evaluation   Reason for Co-Treatment: For patient/therapist safety PT goals addressed during session: Mobility/safety with mobility OT goals addressed during session: ADL's and self-care       AM-PAC PT "6 Clicks" Mobility   Outcome Measure  Help needed turning from your back to your side while in a flat bed without using bedrails?: A Little Help needed moving from lying on your back to sitting on the side of a flat bed without using bedrails?: A Lot Help needed moving to and from a bed to a chair (including a wheelchair)?: A Lot Help needed standing up from a chair using your arms (e.g., wheelchair or bedside chair)?: A Lot Help needed to walk in hospital room?: A Lot Help needed climbing 3-5 steps with a railing? : Total 6 Click Score: 12    End of Session Equipment Utilized During Treatment: Gait belt Activity Tolerance: Patient tolerated treatment well;Patient limited by fatigue Patient left: in chair;with call bell/phone within reach;with chair alarm set(gait belt available) Nurse Communication: Mobility status PT Visit Diagnosis: Unsteadiness on feet (R26.81);Muscle weakness (generalized) (M62.81);Pain Pain - part of body: (sacrum)     Time: 0998-3382 PT Time Calculation (min) (ACUTE ONLY): 23 min  Charges:  $Therapeutic Activity: 8-22 mins                Ramond Dial 04/25/2018, 7:17 PM  Mee Hives, PT MS Acute Rehab Dept. Number: [place]

## 2018-04-25 NOTE — Sedation Documentation (Signed)
Patient breathing through mouth. ETCO2 not reading at times and reading erroneously at other times.

## 2018-04-25 NOTE — Progress Notes (Signed)
Daily Progress Note   Patient Name: Ian Roberts       Date: 04/25/2018 DOB: 1960-06-23  Age: 58 y.o. MRN#: [id number] Attending Physician: Aline August, MD Primary Care Physician: Neale Burly, MD Admit Date: 04/20/2018  Reason for Consultation/Follow-up: Establishing goals of care  Subjective/GOC:  Patient wakes to voice but drowsy. Recently given prn phenergan. Wife, Ian Roberts at bedside.   Patient refused g-tube placement this afternoon. Attempted to explore why [name] declined g-tube placement and risk for aspiration with ongoing vomiting. Explained that NGT may need to be placed to help with decompression of SBO/ileus. Ian Roberts will not answer wife or surgery PA reasoning for his refusal of tube placement.   Wife left the room to see if Ian Roberts would open up to me. Attempted to discuss diagnoses, interventions, and goals of care with patient alone. He will only nod head yes/no to certain questions but not all questions. It does not seem that Ian Roberts has capacity to make medical decisions.   Further spoke with [facility] in hallway about diagnoses, interventions, and GOC.   Ian Roberts tells me since cancer diagnoses, Ian Roberts has been quiet and depressed. He won't talk with anyone including her and daughter. He doesn't like to be alone but will not talk when they sit with him.   Ian Roberts shares that they wish to "continue to fight for him" but the challenges with fighting for him if he does not want to continue to fight. She plans to further attempt discussions with him tonight. I explained that Ian Roberts may be reaching a point where he is unable to make medical decisions for himself and her (as unofficial POA/wife) is faced with making decisions for him. Ian Roberts does not want to make decisions without input from  daughter Veterinary surgeon). Ian Roberts hopes he will talk with her this evening about why he refused g-tube. She speaks of physical therapy working with him this afternoon and tiring him out even more.   Ian Roberts has PMT contact information. Emotional support provided.    Length of Stay: 4  Current Medications: Scheduled Meds:  . [START ON 04/26/2018] enoxaparin (LOVENOX) injection  40 mg Subcutaneous Q24H  . iopamidol      . lidocaine      . mouth rinse  15 mL Mouth Rinse BID  . ondansetron      .  pantoprazole (PROTONIX) IV  40 mg Intravenous Q24H  . scopolamine  1 patch Transdermal Q72H  . sodium chloride flush  10-40 mL Intracatheter Q12H    Continuous Infusions: . ceFAZolin    . dextrose 5 % and 0.45 % NaCl with KCl 40 mEq/L 50 mL/hr at 04/25/18 0954    PRN Meds: acetaminophen **OR** acetaminophen, HYDROmorphone (DILAUDID) injection, ondansetron **OR** ondansetron (ZOFRAN) IV, prochlorperazine, promethazine, sodium chloride flush  Physical Exam Vitals signs and nursing note reviewed.  Constitutional:      Appearance: He is cachectic. He is ill-appearing.  HENT:     Head: Normocephalic and atraumatic.  Eyes:     General: Scleral icterus present.  Pulmonary:     Effort: Pulmonary effort is normal. No tachypnea, accessory muscle usage or respiratory distress.  Abdominal:     Tenderness: There is no abdominal tenderness.  Skin:    General: Skin is warm and dry.     Coloration: Skin is jaundiced.  Neurological:     Mental Status: He is easily aroused.     Comments: Drowsy, will only nod yes/no to some questions  Psychiatric:        Attention and Perception: He is inattentive.        Speech: Speech is delayed.          Vital Signs: BP 105/71 (BP Location: Left Arm)   Pulse (!) 104   Temp 97.7 F (36.5 C)   Resp 18   Ht 5\' 11"  (1.803 m)   Wt 70 kg   SpO2 98%   BMI 21.52 kg/m  SpO2: SpO2: 98 % O2 Device: O2 Device: Room Air O2 Flow Rate:    Intake/output summary:    Intake/Output Summary (Last 24 hours) at 04/25/2018 1604 Last data filed at 04/25/2018 1500 Gross per 24 hour  Intake 600 ml  Output 1700 ml  Net -1100 ml   LBM: Last BM Date: 04/19/18 Baseline Weight: Weight: 70 kg Most recent weight: Weight: 70 kg       Palliative Assessment/Data: PPS: 10%   Flowsheet Rows     Most Recent Value  Intake Tab  Referral Department  Hospitalist  Unit at Time of Referral  Med/Surg Unit  Palliative Care Primary Diagnosis  Cancer  Palliative Care Type  New Palliative care  Date first seen by Palliative Care  04/21/18  Clinical Assessment  Palliative Performance Scale Score  10%  Psychosocial & Spiritual Assessment  Palliative Care Outcomes  Patient/Family meeting held?  Yes  Who was at the meeting?  patient, wife, daughter  Palliative Care Outcomes  Clarified goals of care, Provided end of life care assistance, Provided psychosocial or spiritual support, ACP counseling assistance, Improved pain interventions, Improved non-pain symptom therapy      Patient Active Problem List   Diagnosis Date Noted  . Advanced care planning/counseling discussion   . Metastatic cancer ([place])   . SBO (small bowel obstruction) ([place])   . Protein-calorie malnutrition, severe 04/21/2018  . Pressure injury of skin 04/21/2018  . Palliative care by specialist   . Weakness   . Dehydration   . Abdominal pain   . Hypokalemia 04/20/2018  . Goals of care, counseling/discussion 02/22/2018  . Primary malignant neoplasm of lung metastatic to other site ([place]) 01/31/2018  . DYSLIPIDEMIA 06/05/2008  . HYPERTENSION, BENIGN ESSENTIAL 06/05/2008  . ACUT MI OTH INF WALL SUBSQT EPIS CARE 06/05/2008  . CORONARY ATHEROSCLEROSIS NATIVE CORONARY ARTERY 06/05/2008    Palliative Care Assessment & Plan  Patient Profile: 58 y.o.malewith past medical history of left lung adenocarcinoma with metastases to brain, liver, right scapula , CVA 2017, STEMI, HTN, CAD, necrotizing  perirectal abscessadmitted on 1/29/2020when scheduled for PEG tube placement but found to be severely dehydrated and hypokalemic. IVF initiated. Diagnosed with metastatic lung cancer in November 2019 s/p two rounds of chemotherapy and whole brain radiation. This admission, patient with intractable nausea and vomiting. Found to have SBO vs ileus. Palliative medicine consultation for goals of care.  Assessment/Recommendations/Plan   DNR in the event of cardiac arrest.  Continue current plan of care.   Patient declined g-tube placement today. He will not give [place] a reason why he declined this intervention. Wife plans to attempt further discussions with him tonight. PMT will f/u tomorrow. Wife has PMT contact information.   Goals of Care and Additional Recommendations:  Limitations on Scope of Treatment: Full Scope Treatment  Code Status:  DNR  Prognosis:   Poor prognosis d/t advanced cancer progressing through treatment, significant changes in cognitive, functional and nutritional status  Discharge Planning:  To Be Determined  Care plan was discussed with patient, wife, RN  Thank you for allowing the Palliative Medicine Team to assist in the care of this patient.   Time In: 1525 Time Out: 1600 Total Time 35 Prolonged Time Billed no      Greater than 50%  of this time was spent counseling and coordinating care related to the above assessment and plan.  Ihor Dow, FNP-C Palliative Medicine Team  Phone: [phone] Fax: [phone]  Please contact Palliative Medicine Team phone at [phone] for questions and concerns.

## 2018-04-26 ENCOUNTER — Inpatient Hospital Stay (HOSPITAL_COMMUNITY): Payer: Medicaid Other

## 2018-04-26 LAB — MAGNESIUM: MAGNESIUM: 1.6 mg/dL — AB (ref 1.7–2.4)

## 2018-04-26 LAB — BASIC METABOLIC PANEL
Anion gap: 11 (ref 5–15)
BUN: 12 mg/dL (ref 6–20)
CO2: 19 mmol/L — ABNORMAL LOW (ref 22–32)
Calcium: 7.3 mg/dL — ABNORMAL LOW (ref 8.9–10.3)
Chloride: 110 mmol/L (ref 98–111)
Creatinine, Ser: 0.7 mg/dL (ref 0.61–1.24)
GFR calc Af Amer: >60 mL/min (ref >60)
Glucose, Bld: 89 mg/dL (ref 70–99)
Potassium: 3.1 mmol/L — ABNORMAL LOW (ref 3.5–5.1)
Sodium: 140 mmol/L (ref 135–145)

## 2018-04-26 MED ORDER — DEXAMETHASONE SODIUM PHOSPHATE 4 MG/ML IJ SOLN
4.0000 mg | Freq: Every day | INTRAMUSCULAR | Status: DC
  Administered 2018-04-26 – 2018-05-01 (×6): 4 mg via INTRAVENOUS
  Filled 2018-04-26 (×6): qty 1

## 2018-04-26 MED ORDER — PANTOPRAZOLE SODIUM 40 MG IV SOLR
40.0000 mg | Freq: Two times a day (BID) | INTRAVENOUS | Status: DC
  Administered 2018-04-26 – 2018-05-01 (×10): 40 mg via INTRAVENOUS
  Filled 2018-04-26 (×10): qty 40

## 2018-04-26 MED ORDER — BISACODYL 10 MG RE SUPP
10.0000 mg | Freq: Once | RECTAL | Status: AC
  Administered 2018-04-27: 10 mg via RECTAL
  Filled 2018-04-26: qty 1

## 2018-04-26 MED ORDER — POTASSIUM CHLORIDE 10 MEQ/100ML IV SOLN
10.0000 meq | INTRAVENOUS | Status: AC
  Administered 2018-04-26 (×4): 10 meq via INTRAVENOUS
  Filled 2018-04-26 (×4): qty 100

## 2018-04-26 MED ORDER — MAGNESIUM SULFATE 2 GM/50ML IV SOLN
2.0000 g | Freq: Once | INTRAVENOUS | Status: AC
  Administered 2018-04-26: 2 g via INTRAVENOUS
  Filled 2018-04-26: qty 50

## 2018-04-26 NOTE — Progress Notes (Addendum)
Daily Progress Note   Patient Name: Ian Roberts       Date: 04/26/2018 DOB: 1960/11/04  Age: 58 y.o. MRN#: [id number] Attending Physician: Aline August, MD Primary Care Physician: Neale Burly, MD Admit Date: 04/20/2018  Reason for Consultation/Follow-up: Establishing goals of care  Subjective/GOC:  Patient awake, alert but remains minimally engaged in conversations. He tells me pain kept him up throughout the night but is relieved by prn dilaudid. Not currently nauseous.  Wife, [name] at bedside. She tells me he refused g-tube placement yesterday because he got sick multiple times downstairs and did not feel comfortable getting the tube. We attempted to further discuss with [facility] today.   [name] asks "what are the options." Explained in detail continue aggressive medical interventions including g-tube placement first for decompression and then possibly feeding versus comfort measures and focus on comfort, quality of life, dignity at EOL and symptom management. Introduced hospice options and philosophy if he wishes to return home and focus on his comfort. Patient remains quiet for awhile after explaining options. I asked if he had any thoughts on either options. He states "I have thoughts" but thoughts we may not "like." He doesn't further engage and express his thoughts regarding options. Further explained that we want to honor his wishes whether that is to pursue feeding tube and attempt to see if there are further oncology options or focus on his comfort and allow nature to take course.   We did discuss disease trajectory of progressive cancer diagnosis and EOL expectations.   [name] further expresses her thoughts that she wishes for [facility] to make this decision. She shares that "she can fight  with him" but "not fight for him" if he does not wish to continue with aggressive medical interventions or pursue feeding tube. [name] wants [name] to think about these options this afternoon.   Emotional support provided. [name] has PMT contact information. I encouraged them to call me with questions or concerns regarding both options.    Length of Stay: 5  Current Medications: Scheduled Meds:  . bisacodyl  10 mg Rectal Once  . enoxaparin (LOVENOX) injection  40 mg Subcutaneous Q24H  . mouth rinse  15 mL Mouth Rinse BID  . pantoprazole (PROTONIX) IV  40 mg Intravenous Q24H  . scopolamine  1 patch Transdermal Q72H  .  sodium chloride flush  10-40 mL Intracatheter Q12H    Continuous Infusions: . dextrose 5 % and 0.45 % NaCl with KCl 40 mEq/L 50 mL/hr at 04/26/18 0300  . potassium chloride 10 mEq (04/26/18 0907)    PRN Meds: acetaminophen **OR** acetaminophen, HYDROmorphone (DILAUDID) injection, ondansetron **OR** ondansetron (ZOFRAN) IV, prochlorperazine, promethazine, sodium chloride flush  Physical Exam Vitals signs and nursing note reviewed.  Constitutional:      Appearance: He is cachectic. He is ill-appearing.  HENT:     Head: Normocephalic and atraumatic.  Pulmonary:     Effort: Pulmonary effort is normal. No tachypnea, accessory muscle usage or respiratory distress.  Abdominal:     Tenderness: There is no abdominal tenderness.  Skin:    General: Skin is warm and dry.     Coloration: Skin is pale.  Neurological:     Mental Status: He is alert and easily aroused.     Comments: Poorly engaged in conversation.  Psychiatric:        Speech: Speech is delayed.          Vital Signs: BP 110/75 (BP Location: Left Arm)   Pulse 100   Temp (!) 97.5 F (36.4 C) (Oral)   Resp 17   Ht 5\' 11"  (1.803 m)   Wt 70 kg   SpO2 99%   BMI 21.52 kg/m  SpO2: SpO2: 99 % O2 Device: O2 Device: Room Air O2 Flow Rate:    Intake/output summary:   Intake/Output Summary (Last 24 hours) at  04/26/2018 6734 Last data filed at 04/26/2018 0300 Gross per 24 hour  Intake 586.74 ml  Output 1475 ml  Net -888.26 ml   LBM: Last BM Date: 04/19/18 Baseline Weight: Weight: 70 kg Most recent weight: Weight: 70 kg       Palliative Assessment/Data: PPS: 10%   Flowsheet Rows     Most Recent Value  Intake Tab  Referral Department  Hospitalist  Unit at Time of Referral  Med/Surg Unit  Palliative Care Primary Diagnosis  Cancer  Palliative Care Type  New Palliative care  Date first seen by Palliative Care  04/21/18  Clinical Assessment  Palliative Performance Scale Score  10%  Psychosocial & Spiritual Assessment  Palliative Care Outcomes  Patient/Family meeting held?  Yes  Who was at the meeting?  patient, wife, daughter  Palliative Care Outcomes  Clarified goals of care, Provided end of life care assistance, Provided psychosocial or spiritual support, ACP counseling assistance, Improved pain interventions, Improved non-pain symptom therapy      Patient Active Problem List   Diagnosis Date Noted  . Advanced care planning/counseling discussion   . Metastatic cancer ([place])   . SBO (small bowel obstruction) ([place])   . Protein-calorie malnutrition, severe 04/21/2018  . Pressure injury of skin 04/21/2018  . Palliative care by specialist   . Weakness   . Dehydration   . Abdominal pain   . Hypokalemia 04/20/2018  . Goals of care, counseling/discussion 02/22/2018  . Primary malignant neoplasm of lung metastatic to other site ([place]) 01/31/2018  . DYSLIPIDEMIA 06/05/2008  . HYPERTENSION, BENIGN ESSENTIAL 06/05/2008  . ACUT MI OTH INF WALL SUBSQT EPIS CARE 06/05/2008  . 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 IVF initiated. Diagnosed with metastatic lung cancer in November 2019 s/p two rounds of chemotherapy and whole brain radiation. This admission, patient with intractable nausea and vomiting. Found to have SBO vs ileus. Palliative medicine consultation for goals of care.  Assessment/Recommendations/Plan   DNR in the event of cardiac arrest.  Continue current plan of care.   Further discussed options of continued medical interventions including PEG tube placement versus comfort focused care. Wife wishes for [name] to make this decision. [name] minimally engages in conversation. Wife has PMT contact information and understands she can call today if he wishes to further discuss options. Hospice philosophy and options introduced.   Initiate Decadron IV 4mg  daily for symptom management.  Goals of Care and Additional Recommendations:  Limitations on Scope of Treatment: Full Scope Treatment  Code Status:  DNR  Prognosis:   Poor prognosis d/t advanced cancer progressing through treatment, significant changes in cognitive, functional and nutritional status  Discharge Planning:  To Be Determined  Care plan was discussed with patient, wife, Dr. Starla Link, PMT team rounds  Thank you for allowing the Palliative Medicine Team to assist in the care of this patient.   Time In: 0920 Time Out: 1000 Total Time 40 Prolonged Time Billed no      Greater than 50%  of this time was spent counseling and coordinating care related to the above assessment and plan.  Ihor Dow, FNP-C Palliative Medicine Team  Phone: [phone] Fax: [phone]  Please contact Palliative Medicine Team phone at [phone] for questions and concerns.

## 2018-04-26 NOTE — Progress Notes (Signed)
Pt refusing 1X suppository. MD Alekh aware.

## 2018-04-26 NOTE — Progress Notes (Signed)
[facility] Progress Note      Assessment/Plan HTN HLD CAD, Hx of STEMI in 2010 s/p stent placement Hx of CVA Metastatic adenocarcinoma of lung - mets to brain, liver, right clavicle  Severe protein calorie malnutrition/Failure to thrive SBO vs ileus - abdominal film 02/03 showed improved dilatation of small bowel, today's AXR read pending but looks similar to yesterdays - Pt refused IR PEG - palliative team following -suppository today - pt will need NGT if he has N/V - we will follow  FEN: NPO, ice chips VTE: SCD's, lovenox ID: Ancef 02/03-02/04 Follow up: TBD   LOS: 5 days    Subjective: CC: no complaints  Pt states he refused the PEG yesterday cause he had 4 small episodes of vomiting while lying on the table and he states he tried to alert someone to his vomiting and when he was unsuccessful he felt uncomfortable to proceed with the PEG. He denies abdominal pain, nausea, vomiting today. He denies flatus.   Objective: Vital signs in last 24 hours: Temp:  [97.5 F (36.4 C)] 97.5 F (36.4 C) (02/04 0509) Pulse Rate:  [100-107] 100 (02/04 0509) Resp:  [12-19] 17 (02/04 0509) BP: (103-110)/(71-86) 110/75 (02/04 0509) SpO2:  [98 %-100 %] 99 % (02/04 0509) Last BM Date: 04/19/18  Intake/Output from previous day: 02/03 0701 - 02/04 0700 In: 586.7 [I.V.:586.7] Out: 1475 [Urine:1225; Emesis/NG output:250] Intake/Output this shift: No intake/output data recorded.  PE: Gen:  Alert, lethargic, ill appearing, more talkative today Pulm:  Rate and effort normal Abd: Soft, ND, no BS, no TTP, no peritonitis  Skin: warm and dry  Anti-infectives: Anti-infectives (From admission, onward)   Start     Dose/Rate Route Frequency Ordered Stop   04/25/18 1250  ceFAZolin (ANCEF) 2-4 GM/100ML-% IVPB    Note to Pharmacy:  Manuela Neptune   : cabinet override      04/25/18 1250 04/26/18 0059   04/25/18 0615  ceFAZolin (ANCEF) IVPB 2g/100 mL premix     2  g 200 mL/hr over 30 Minutes Intravenous To Radiology 04/25/18 0606 04/25/18 1329      Lab Results:  Recent Labs    04/24/18 0401 04/25/18 0418  WBC 11.4* 13.0*  HGB 9.5* 9.9*  HCT 30.4* 31.7*  PLT 295 260   BMET Recent Labs    04/25/18 0418 04/26/18 0437  NA 141 140  K 3.5 3.1*  CL 112* 110  CO2 19* 19*  GLUCOSE 69* 89  BUN 14 12  CREATININE 0.73 0.70  CALCIUM 7.5* 7.3*   PT/INR Recent Labs    04/25/18 0418  LABPROT 18.7*  INR 1.58   CMP     Component Value Date/Time   NA 140 04/26/2018 0437   K 3.1 (L) 04/26/2018 0437   CL 110 04/26/2018 0437   CO2 19 (L) 04/26/2018 0437   GLUCOSE 89 04/26/2018 0437   BUN 12 04/26/2018 0437   CREATININE 0.70 04/26/2018 0437   CALCIUM 7.3 (L) 04/26/2018 0437   PROT 6.1 (L) 04/22/2018 0414   ALBUMIN 1.8 (L) 04/22/2018 0414   AST 11 (L) 04/22/2018 0414   ALT 10 04/22/2018 0414   ALKPHOS 207 (H) 04/22/2018 0414   BILITOT 1.9 (H) 04/22/2018 0414   GFRNONAA >60 04/26/2018 0437   GFRAA >60 04/26/2018 0437   Lipase     Component Value Date/Time   LIPASE 25 03/09/2018 0013    Studies/Results: Ir Fluoro Rm 30-60 Min  Result Date: 04/25/2018 INDICATION: History metastatic lung cancer,  now with chronic enteric obstructive symptoms. Request made for placement of a percutaneous gastrostomy tube for venting purposes. EXAM: IR FLUORO RM 0-60 MIN COMPARISON:  None. MEDICATIONS: Zofran 4 mg IV CONTRAST:  None ANESTHESIA/SEDATION: None FLUOROSCOPY TIME:  None COMPLICATIONS: None immediate. PROCEDURE: Informed written consent was obtained from the patient following explanation of the procedure, risks, benefits and alternatives. Despite obtaining consent from the patient and the patient's wife prior to the procedure, once placed supine on the fluoroscopy table, the patient experienced nausea and several episodes of vomiting and despite being administered Zofran, ultimately elected not to pursue gastrostomy tube placement. This was  discussed at great length with both the patient the patient's wife however ultimately, the patient, who (per his wife) maintains medical decision making capacity, refuses to undergo gastrostomy tube placement at the present time. No procedure attempted. FINDINGS: Patient refusal as detailed above. IMPRESSION: Patient refused percutaneous gastrostomy tube placement. No procedure attempted. Electronically Signed   By: Sandi Mariscal M.D.   On: 04/25/2018 13:52   Dg Abd 2 Views  Result Date: 04/24/2018 CLINICAL DATA:  Nausea, vomiting, small bowel obstruction. EXAM: ABDOMEN - 2 VIEW COMPARISON:  Radiographs of April 21, 2018. CT scan of April 22, 2018. FINDINGS: Mildly dilated small bowel loops are noted concerning for distal small bowel obstruction or ileus. No colonic dilatation is noted. Stool is noted in the rectum. There is no evidence of free air. No radio-opaque calculi or other significant radiographic abnormality is seen. IMPRESSION: Mildly dilated small bowel loops are noted concerning for distal small bowel obstruction or ileus. Electronically Signed   By: Marijo Conception, M.D.   On: 04/24/2018 12:54   Dg Abd Portable 1v  Result Date: 04/25/2018 CLINICAL DATA:  Small bowel obstruction. EXAM: PORTABLE ABDOMEN - 1 VIEW COMPARISON:  Radiographs of April 21, 2018. FINDINGS: Small bowel dilatation noted on prior exam is significantly improved, with residual mildly dilated loop seen in the left upper quadrant, most consistent with improving ileus. Phleboliths are noted in the pelvis. No nephrolithiasis is noted. IMPRESSION: Significantly improved small bowel dilatation is noted most consistent with improving ileus. Electronically Signed   By: Marijo Conception, M.D.   On: 04/25/2018 14:51      Kalman Drape , [facility] 04/26/2018, 8:18 AM  Pager: [phone] Mon-Wed, Friday 7:00am-4:30pm Thurs 7am-11:30am  Consults: [phone]

## 2018-04-26 NOTE — Care Management Note (Addendum)
Case Management Note  Patient Details  Name: Ian Roberts MRN: [id number] Date of Birth: 04-Oct-1960  Subjective/Objective:  Presents with dehydration, hypokalemia/ FTT. Found to have SBO vs ileus. Hx of metastatic left lung adenocarcinoma with metastases to the brain, liver and shoulder. Resides with wife, [name].          Zayed Griffie (Spouse) Juanito Doom (Daughter)    [phone]      PCP: Stoney Bang A   Action/Plan: Palliative following...? g-tube placement vs comfort focused care ...  NCM following for TOC needs ... pt without health insurance. If d/c to home with home health services, pt will need charity approval for home health services  Expected Discharge Date:                  Expected Discharge Plan:     In-House Referral:  Clinical Social Work  Discharge planning Services  CM Consult  Post Acute Care Choice:    Choice offered to:     DME Arranged:    DME Agency:     HH Arranged:    [place] Agency:     Status of Service:  In process, will continue to follow  If discussed at Long Length of Stay Meetings, dates discussed:    Additional Comments:  Sharin Mons, RN 04/26/2018, 3:26 PM

## 2018-04-26 NOTE — Progress Notes (Signed)
Pt refused to have indwelling catheter placed but did agree to be straight cathed. Pt put out 61mL.

## 2018-04-26 NOTE — Progress Notes (Signed)
Nutrition Follow-up  DOCUMENTATION CODES:   Severe malnutrition in context of chronic illness, Underweight  INTERVENTION:   - Monitor for GOC decisions  - Monitor for diet advancement and supplement as appropriate, pt has been without adequate nutrition since 04/21/18 and likely prior to this date  If G-tube placement and enteral nutrition are within [place], recommend initiation of bolus tube feeding: - Provide Osmolite 1.5 cal formula 100 ml bolus via G-tube and advance volume by 50 ml each subsequent bolus until goal volume is met  Goal tube feeding regimen: - Osmolite 1.5 cal 474 ml (2 cans) three times daily (1422 ml daily or 6 cans daily) with free water 30 ml per tube before and after each feeding - Pro-stat 30 ml TID - Additional free water 250 ml QID  Recommended goal tube feeding regimen provides 2433 kcal, 134 grams of protein, and 2260 ml of H2O (100% of needs).  If G-tube placed and TF started, monitor magnesium, potassium, and phosphorus daily for at least 3 days, MD to replete as needed, as pt is at risk for refeeding syndrome given severe malnutrition  NUTRITION DIAGNOSIS:   Severe Malnutrition related to chronic illness (lung cancer w/ mets to brain, right clavicle, and liver) as evidenced by severe fat depletion, severe muscle depletion, moderate fat depletion, moderate muscle depletion, percent weight loss (26.5% weight loss in less than 3 months).  Ongoing  GOAL:   Patient will meet greater than or equal to 90% of their needs  Unmet  MONITOR:   Weight trends, I & O's, Diet advancement, Labs, Skin, Other (Comment)(GOC)  REASON FOR ASSESSMENT:   Malnutrition Screening Tool    ASSESSMENT:   58 year old male who presented to the ED on 1/29 from short stay where he was scheduled to have a G-tube placed but was found to be extremely weak and hypokalemic. PMH significant for lung cancer with mets to the brain, right clavicle, liver currently undergoing chemo  and radiation, CAD, HTN, HLD.  1/30 - pt found to have SBO vs ileus, NGT placement for decompression was unsuccessful 2/3 - pt declined G-tube placement d/t emesis  Palliative care team following.  Surgery is following but recommends IR placement of G-tube/PEG vs open G-tube for decompression as NGT placement on 1/30 was unsuccessful. However, pt refused G-tube placement yesterday.  Per MD note today, abdominal x-ray from today still showing dilated bowel loops. Surgery note today with recommendations for NGT placement if pt has N/V. Palliative note today indicates that family and pt are going to continue to consider options. No decisions made at this time.  Discussed pt with RN.  Medications reviewed and include: Dulcolax, Protonix IVF: D5 and 0.45% NaCl w/ KCl 40 mEq/L @ 50 ml/hr (provides 204 kcal daily)  Labs reviewed: potassium 3.1 (L), magnesium 1.6 (L), hemoglobin 9.9 (L)  UOP: 1225 ml x 24 hours Emesis: 250 ml + 1 unmeasured occurrence x 24 hours  Diet Order:   Diet Order            Diet NPO time specified  Diet effective now              EDUCATION NEEDS:   Not appropriate for education at this time  Skin:  Skin Assessment: Skin Integrity Issues: Stage III: sacrum  Last BM:  1/28  Height:   Ht Readings from Last 1 Encounters:  04/20/18 '5\' 11"'  (1.803 m)    Weight:   Wt Readings from Last 1 Encounters:  04/20/18 70 kg  Ideal Body Weight:  78.2 kg  BMI:  Body mass index is 21.52 kg/m.  Estimated Nutritional Needs:   Kcal:  2400-2600  Protein:  125-140 grams  Fluid:  >/= 2.4 L    Gaynell Face, MS, RD, LDN Inpatient Clinical Dietitian Pager: [phone] Weekend/After Hours: [phone]

## 2018-04-26 NOTE — Progress Notes (Signed)
Patient ID: Ian Roberts, male   DOB: [birth year], 58 y.o.   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  He was supposed to have PEG tube placement as an outpatient on 04/20/2018 but got admitted because of severe dehydration and hypokalemia.  He was started on IV fluids. He was found to have bowel obstruction versus ileus, general surgery was consulted. palliative care was also consulted.  Assessment & Plan:    Active Problems:   Primary malignant neoplasm of lung metastatic to other site [facility])   Hypokalemia   Protein-calorie malnutrition, severe   Pressure injury of skin   Palliative care by specialist   Weakness   Dehydration   Abdominal pain   Advanced care planning/counseling discussion   Metastatic cancer ([facility])   SBO (small bowel obstruction) (HCC)  Small bowel obstruction versus ileus presenting with intractable nausea and vomiting -Continue antiemetics.  Had few episodes of vomiting yesterday. -General surgery following.  -Abdominal x-ray from today still shows dilated bowel loops.  Will follow further general surgery recommendations.  Continue n.p.o. If has persistent vomiting, will need NG tube.  NG tube was unable to be passed twice a few days ago.  Dehydration -From poor oral intake.  Continue current IV fluids.  Chronic anemia of chronic disease -Probably from cancer.  Status post 2 units of packed red cell transfusion on 04/23/2018 for hemoglobin of 6.5.  Monitor.  No signs of overt bleeding  Hypokalemia -Replace.  Repeat a.m. labs  Hypomagnesemia -Replace.  Repeat a.m. labs  Failure to thrive with decreased p.o. intake/hypoalbuminemia -From metastatic cancer -IR attempted PEG tube  placement on 04/25/2018 but patient refused it.  Unclear why he did that.  He is a very poor historian and hardly participates in communication. -Encouraged patient to talk to his family and decide if he wants to pursue PEG tube placement versus hospice.  If he does not want PEG tube basement, he should consider hospice.  Metastatic lung cancer with metastases to brain, liver and shoulder -Currently on chemotherapy as an outpatient and follows up with oncology at [facility] -Overall prognosis is very poor.  Palliative care following.  CT of chest abdomen and pelvis shows increasing size of lung malignancy along with some stable and some worsening metastases.  Patient and family want to discuss with outpatient follow-up with oncology as an outpatient. -CODE STATUS has been changed to DNR by palliative care discussion with family.  Chronic pain -Continue IV Dilaudid for now.  Hypertension -Blood pressure on hold for now.  ACE inhibitor on hold  Healing full-thickness wound/pressure ulcer on sacrum/buttocks with pink dry scar tissue and patchy areas -Wound care as per wound care nurse recommendations.   DVT prophylaxis: Lovenox Code Status: Full Family Communication: Wife at bedside Disposition Plan: Depends on clinical outcome  Consultants: Palliative care/IR/general surgery Procedures: None  Antimicrobials: None   Subjective: Patient seen and examined at bedside.  He is a poor historian.  He is awake, hardly answers any questions.  Had few episodes of vomiting yesterday.  No bowel movement yesterday.  Currently denies worsening abdominal pain.  No overnight fevers.  Objective: Vitals:   04/25/18 1315 04/25/18 1359 04/25/18 2149 04/26/18 0509  BP: 108/86 105/71 110/76  110/75  Pulse: (!) 104 (!) 104 (!) 107 100  Resp: 12 18 19 17   Temp:   (!) 97.5 F (36.4 C) (!) 97.5 F (36.4 C)  TempSrc:   Oral Oral  SpO2: 100% 98% 98% 99%  Weight:      Height:        Intake/Output  Summary (Last 24 hours) at 04/26/2018 0856 Last data filed at 04/26/2018 0300 Gross per 24 hour  Intake 586.74 ml  Output 1475 ml  Net -888.26 ml   Filed Weights   04/20/18 1105  Weight: 70 kg    Examination:  General exam: Appears chronically ill.  Poor historian.  No acute distress.  Awake but hardly answers questions.   Respiratory system: Bilateral decreased breath sounds at bases, some scattered crackles Cardiovascular system: S1 & S2 heard, slightly tachycardic intermittently Gastrointestinal system: Abdomen is nondistended, soft and nontender.  Bowel sounds sluggish Extremities: No cyanosis, edema    Data Reviewed: I have personally reviewed following labs and imaging studies  CBC: Recent Labs  Lab 04/21/18 0353 04/22/18 0414 04/23/18 0324 04/23/18 2119 04/24/18 0401 04/25/18 0418  WBC 9.1 11.5* 11.7*  --  11.4* 13.0*  NEUTROABS  --  10.2* 10.4*  --  9.8*  --   HGB 7.6* 7.0* 6.5* 9.3* 9.5* 9.9*  HCT 23.2* 22.2* 21.3* 29.6* 30.4* 31.7*  MCV 88.5 92.1 95.1  --  87.1 87.3  PLT 466* 425* 366  --  295 962   Basic Metabolic Panel: Recent Labs  Lab 04/22/18 0414 04/23/18 0324 04/24/18 0401 04/25/18 0418 04/26/18 0437  NA 139 139 138 141 140  K 2.8* 4.3 3.6 3.5 3.1*  CL 106 111 110 112* 110  CO2 19* 17* 18* 19* 19*  GLUCOSE 150* 100* 78 69* 89  BUN 19 16 13 14 12   CREATININE 0.86 0.70 0.64 0.73 0.70  CALCIUM 7.6* 7.6* 7.6* 7.5* 7.3*  MG 1.7 1.5* 1.7 1.5* 1.6*   GFR: Estimated Creatinine Clearance: 102.1 mL/min (by C-G formula based on SCr of 0.7 mg/dL). Liver Function Tests: Recent Labs  Lab 04/20/18 1008 04/22/18 0414  AST 17 11*  ALT 15 10  ALKPHOS 231* 207*  BILITOT 2.1* 1.9*  PROT 7.4 6.1*  ALBUMIN 2.1* 1.8*   No results for input(s): LIPASE, AMYLASE in the last 168 hours. No results for input(s): AMMONIA in the last 168 hours. Coagulation Profile: Recent Labs  Lab 04/20/18 1035 04/25/18 0418  INR 1.47 1.58   Cardiac Enzymes: No results  for input(s): CKTOTAL, CKMB, CKMBINDEX, TROPONINI in the last 168 hours. BNP (last 3 results) No results for input(s): PROBNP in the last 8760 hours. HbA1C: No results for input(s): HGBA1C in the last 72 hours. CBG: No results for input(s): GLUCAP in the last 168 hours. Lipid Profile: No results for input(s): CHOL, HDL, LDLCALC, TRIG, CHOLHDL, LDLDIRECT in the last 72 hours. Thyroid Function Tests: No results for input(s): TSH, T4TOTAL, FREET4, T3FREE, THYROIDAB in the last 72 hours. Anemia Panel: No results for input(s): VITAMINB12, FOLATE, FERRITIN, TIBC, IRON, RETICCTPCT in the last 72 hours. Sepsis Labs: No results for input(s): PROCALCITON, LATICACIDVEN in the last 168 hours.  No results found for this or any previous visit (from the past 240 hour(s)).       Radiology Studies: Ir Fluoro Rm 30-60 Min  Result Date: 04/25/2018 INDICATION: History metastatic lung cancer, now with chronic enteric obstructive symptoms. Request made for placement of a percutaneous gastrostomy tube for venting purposes. EXAM: IR FLUORO  RM 0-60 MIN COMPARISON:  None. MEDICATIONS: Zofran 4 mg IV CONTRAST:  None ANESTHESIA/SEDATION: None FLUOROSCOPY TIME:  None COMPLICATIONS: None immediate. PROCEDURE: Informed written consent was obtained from the patient following explanation of the procedure, risks, benefits and alternatives. Despite obtaining consent from the patient and the patient's wife prior to the procedure, once placed supine on the fluoroscopy table, the patient experienced nausea and several episodes of vomiting and despite being administered Zofran, ultimately elected not to pursue gastrostomy tube placement. This was discussed at great length with both the patient the patient's wife however ultimately, the patient, who (per his wife) maintains medical decision making capacity, refuses to undergo gastrostomy tube placement at the present time. No procedure attempted. FINDINGS: Patient refusal as  detailed above. IMPRESSION: Patient refused percutaneous gastrostomy tube placement. No procedure attempted. Electronically Signed   By: Sandi Mariscal M.D.   On: 04/25/2018 13:52   Dg Abd 2 Views  Result Date: 04/24/2018 CLINICAL DATA:  Nausea, vomiting, small bowel obstruction. EXAM: ABDOMEN - 2 VIEW COMPARISON:  Radiographs of April 21, 2018. CT scan of April 22, 2018. FINDINGS: Mildly dilated small bowel loops are noted concerning for distal small bowel obstruction or ileus. No colonic dilatation is noted. Stool is noted in the rectum. There is no evidence of free air. No radio-opaque calculi or other significant radiographic abnormality is seen. IMPRESSION: Mildly dilated small bowel loops are noted concerning for distal small bowel obstruction or ileus. Electronically Signed   By: Marijo Conception, M.D.   On: 04/24/2018 12:54   Dg Abd Portable 1v  Result Date: 04/26/2018 CLINICAL DATA:  Follow up small bowel obstruction EXAM: PORTABLE ABDOMEN - 1 VIEW COMPARISON:  04/25/2018 FINDINGS: Scattered large and small bowel gas is again identified. Considerable retained fecal material is noted within the left colon. Small bowel dilatation is again identified stable from the previous exam. No new focal abnormality is noted. IMPRESSION: Stable small bowel dilatation likely related to underlying ileus. Retained fecal material within the left colon. Electronically Signed   By: Inez Catalina M.D.   On: 04/26/2018 08:38   Dg Abd Portable 1v  Result Date: 04/25/2018 CLINICAL DATA:  Small bowel obstruction. EXAM: PORTABLE ABDOMEN - 1 VIEW COMPARISON:  Radiographs of April 21, 2018. FINDINGS: Small bowel dilatation noted on prior exam is significantly improved, with residual mildly dilated loop seen in the left upper quadrant, most consistent with improving ileus. Phleboliths are noted in the pelvis. No nephrolithiasis is noted. IMPRESSION: Significantly improved small bowel dilatation is noted most consistent with  improving ileus. Electronically Signed   By: Marijo Conception, M.D.   On: 04/25/2018 14:51        Scheduled Meds: . bisacodyl  10 mg Rectal Once  . enoxaparin (LOVENOX) injection  40 mg Subcutaneous Q24H  . mouth rinse  15 mL Mouth Rinse BID  . pantoprazole (PROTONIX) IV  40 mg Intravenous Q24H  . scopolamine  1 patch Transdermal Q72H  . sodium chloride flush  10-40 mL Intracatheter Q12H   Continuous Infusions: . dextrose 5 % and 0.45 % NaCl with KCl 40 mEq/L 50 mL/hr at 04/26/18 0300  . potassium chloride 10 mEq (04/26/18 0748)     LOS: 5 days        Aline August, MD [facility] Pager [phone]  If 7PM-7AM, please contact night-coverage [email or web address] Password [facility] 04/26/2018, 8:56 AM

## 2018-04-27 LAB — CBC WITH DIFFERENTIAL/PLATELET
ABS IMMATURE GRANULOCYTES: 0 10*3/uL (ref 0.00–0.07)
Basophils Absolute: 0 10*3/uL (ref 0.0–0.1)
Basophils Relative: 0 %
Eosinophils Absolute: 0 10*3/uL (ref 0.0–0.5)
Eosinophils Relative: 0 %
HCT: 29.5 % — ABNORMAL LOW (ref 39.0–52.0)
Hemoglobin: 9.4 g/dL — ABNORMAL LOW (ref 13.0–17.0)
Lymphocytes Relative: 0 %
Lymphs Abs: 0 10*3/uL — ABNORMAL LOW (ref 0.7–4.0)
MCH: 28.2 pg (ref 26.0–34.0)
MCHC: 31.9 g/dL (ref 30.0–36.0)
MCV: 88.6 fL (ref 80.0–100.0)
Monocytes Absolute: 0.2 10*3/uL (ref 0.1–1.0)
Monocytes Relative: 2 %
Neutro Abs: 10.2 10*3/uL — ABNORMAL HIGH (ref 1.7–7.7)
Neutrophils Relative %: 98 %
Platelets: 177 10*3/uL (ref 150–400)
RBC: 3.33 MIL/uL — ABNORMAL LOW (ref 4.22–5.81)
RDW: 25.1 % — ABNORMAL HIGH (ref 11.5–15.5)
WBC: 10.4 10*3/uL (ref 4.0–10.5)
nRBC: 0 % (ref 0.0–0.2)
nRBC: 0 /100 WBC

## 2018-04-27 LAB — BASIC METABOLIC PANEL
Anion gap: 7 (ref 5–15)
BUN: 10 mg/dL (ref 6–20)
CHLORIDE: 109 mmol/L (ref 98–111)
CO2: 21 mmol/L — ABNORMAL LOW (ref 22–32)
Calcium: 7.3 mg/dL — ABNORMAL LOW (ref 8.9–10.3)
Creatinine, Ser: 0.55 mg/dL — ABNORMAL LOW (ref 0.61–1.24)
GFR calc Af Amer: >60 mL/min (ref >60)
GFR calc non Af Amer: >60 mL/min (ref >60)
Glucose, Bld: 102 mg/dL — ABNORMAL HIGH (ref 70–99)
Potassium: 3.9 mmol/L (ref 3.5–5.1)
Sodium: 137 mmol/L (ref 135–145)

## 2018-04-27 LAB — MAGNESIUM: Magnesium: 1.7 mg/dL (ref 1.7–2.4)

## 2018-04-27 MED ORDER — BOOST / RESOURCE BREEZE PO LIQD CUSTOM
1.0000 | Freq: Three times a day (TID) | ORAL | Status: DC
  Administered 2018-04-27 – 2018-04-29 (×4): 1 via ORAL

## 2018-04-27 MED ORDER — FLEET ENEMA 7-19 GM/118ML RE ENEM
1.0000 | ENEMA | Freq: Once | RECTAL | Status: AC
  Administered 2018-04-27: 1 via RECTAL
  Filled 2018-04-27: qty 1

## 2018-04-27 MED ORDER — ZOLPIDEM TARTRATE 5 MG PO TABS: 5.0000 mg | ORAL_TABLET | Freq: Every evening | ORAL | Status: DC | PRN

## 2018-04-27 MED ORDER — MAGNESIUM SULFATE 2 GM/50ML IV SOLN
2.0000 g | Freq: Once | INTRAVENOUS | Status: AC
  Administered 2018-04-27: 2 g via INTRAVENOUS
  Filled 2018-04-27: qty 50

## 2018-04-27 NOTE — Progress Notes (Signed)
Writer administered suppository and fleets enema during shift today. Pt was able to have minor relief, with a small bowel movement.

## 2018-04-27 NOTE — Progress Notes (Signed)
OT Cancellation Note  Patient Details Name: Ian Roberts MRN: [id number] DOB: 1960/09/29   Cancelled Treatment:    Reason Eval/Treat Not Completed: Other (comment). Second attempt today. First attempt, pt on BSC trying to have bowel movement, pt and spouse requested OT reattempt. Second attempt, pt resting in bed, spouse reports "not today, he's having a bad day." Plan to check in on pt tomorrow.   Tyrone Schimke, OT Acute Rehabilitation Services Pager: [phone] Office: [phone]  04/27/2018, 1:41 PM

## 2018-04-27 NOTE — Progress Notes (Signed)
PROGRESS NOTE        PATIENT DETAILS Name: Ian Roberts Age: 58 y.o. Sex: male Date of Birth: 1960-07-31 Admit Date: 04/20/2018 Admitting Physician Merton Border, MD [id number], Samul Dada, MD  Brief Narrative: Patient is a 58 y.o. male with history of widespread metastatic lung adenocarcinoma-presented with several day history of vomiting, fatigue and poor oral intake, was found to have small bowel obstruction and admitted to the hospitalist service.  Subjective: Refused Dulcolax suppository yesterday-has not had a BM or a flatus for the past several days.  Also started developing acute urinary retention last night requiring in and out catheterization-subsequently finally consented to Foley catheter that was placed earlier this morning.  Assessment/Plan: Small bowel obstruction: Continue supportive care-no vomiting but he has not had flatus or bowel movement in several days.  Plans are to refrain from placing a NG tube if he is not vomiting (as hard to place a few days back).  Continue supportive care-General surgery following.  Dehydration: Secondary to poor oral intake due to small bowel obstruction and decreased appetite in the setting of advanced lung cancer.  Continue IV fluid hydration.  Hypokalemia/hypomagnesemia: Continue with attempts to replete-recheck tomorrow.  Severe failure to thrive syndrome: Looks very frail-this is in the setting of advanced metastatic lung cancer-most recent albumin on 1/31 was 1.8!.  Long discussion with patient and spouse at bedside-they understand poor overall prognosis-both seem to understand that he may be best served with hospice care at this point.  Per spouse-marked decline in functioning, significant loss of weight over the past 3 to 4 weeks.    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    Chronic pain syndrome: Unable to take oral narcotics-pain appears to be well controlled with IV Dilaudid.  DVT Prophylaxis: Prophylactic Lovenox   Code Status: DNR  Family Communication: Spouse at bedside  Disposition Plan: Remain inpatient-SNF or hospice on discharge  Antimicrobial agents: Anti-infectives (From admission, onward)   Start     Dose/Rate Route Frequency Ordered Stop   04/25/18 1250  ceFAZolin (ANCEF) 2-4 GM/100ML-% IVPB    Note to Pharmacy:  Manuela Neptune   : cabinet override      04/25/18 1250 04/26/18 0059   04/25/18 0615  ceFAZolin (ANCEF) IVPB 2g/100 mL premix     2 g 200 mL/hr over 30 Minutes Intravenous To Radiology 04/25/18 0606 04/25/18 1329      Procedures: None  CONSULTS:  general surgery and Palliative care  Time spent: 25 minutes-Greater than 50% of this time was spent in counseling, explanation of diagnosis, planning of further management, and coordination of care.  MEDICATIONS: Scheduled Meds: . dexamethasone  4 mg Intravenous Daily  . enoxaparin (LOVENOX) injection  40 mg Subcutaneous Q24H  . feeding supplement  1 Container Oral TID BM  . mouth rinse  15 mL Mouth Rinse BID  . pantoprazole (PROTONIX) IV  40 mg Intravenous Q12H  . scopolamine  1 patch Transdermal Q72H  . sodium chloride flush  10-40 mL Intracatheter Q12H  . sodium phosphate  1 enema Rectal Once   Continuous Infusions: . dextrose 5 % and 0.45 % NaCl with KCl 40  mEq/L 50 mL/hr at 04/27/18 1020   PRN Meds:.acetaminophen **OR** acetaminophen, HYDROmorphone (DILAUDID) injection, ondansetron **OR** ondansetron (ZOFRAN) IV, prochlorperazine, promethazine, sodium chloride flush   PHYSICAL EXAM: Vital signs: Vitals:   04/26/18 0509 04/26/18 1450 04/26/18 2149 04/27/18 0459  BP: 110/75 117/86 116/86 119/81  Pulse: 100 (!) 102 100 97  Resp: 17 18 19 17     Temp: (!) 97.5 F (36.4 C) 98.5 F (36.9 C) 97.8 F (36.6 C) 97.6 F (36.4 C)  TempSrc: Oral Oral Oral Oral  SpO2: 99% 98% 98% 97%  Weight:      Height:       Filed Weights   04/20/18 1105  Weight: 70 kg   Body mass index is 21.52 kg/m.   General appearance :Awake, alert, not in any distress.  Looks very frail and chronically sick appearing HEENT: Atraumatic and Normocephalic Resp:Good air entry bilaterally, no added sounds  CVS: S1 S2 regular, no murmurs.  GI: Bowel sounds present, Non tender and not distended with no gaurding, rigidity or rebound.No organomegaly Extremities: B/L Lower Ext shows no edema, both legs are warm to touch Neurology: Nonfocal but appears to have significant generalized weakness.   Musculoskeletal:No digital cyanosis Skin:No Rash, warm and dry Wounds:N/A  I have personally reviewed following labs and imaging studies  LABORATORY DATA: CBC: Recent Labs  Lab 04/22/18 0414 04/23/18 0324 04/23/18 2119 04/24/18 0401 04/25/18 0418 04/27/18 0354  WBC 11.5* 11.7*  --  11.4* 13.0* 10.4  NEUTROABS 10.2* 10.4*  --  9.8*  --  10.2*  HGB 7.0* 6.5* 9.3* 9.5* 9.9* 9.4*  HCT 22.2* 21.3* 29.6* 30.4* 31.7* 29.5*  MCV 92.1 95.1  --  87.1 87.3 88.6  PLT 425* 366  --  295 260 096    Basic Metabolic Panel: Recent Labs  Lab 04/23/18 0324 04/24/18 0401 04/25/18 0418 04/26/18 0437 04/27/18 0354  NA 139 138 141 140 137  K 4.3 3.6 3.5 3.1* 3.9  CL 111 110 112* 110 109  CO2 17* 18* 19* 19* 21*  GLUCOSE 100* 78 69* 89 102*  BUN 16 13 14 12 10   CREATININE 0.70 0.64 0.73 0.70 0.55*  CALCIUM 7.6* 7.6* 7.5* 7.3* 7.3*  MG 1.5* 1.7 1.5* 1.6* 1.7    GFR: Estimated Creatinine Clearance: 102.1 mL/min (A) (by C-G formula based on SCr of 0.55 mg/dL (L)).  Liver Function Tests: Recent Labs  Lab 04/22/18 0414  AST 11*  ALT 10  ALKPHOS 207*  BILITOT 1.9*  PROT 6.1*  ALBUMIN 1.8*   No results for input(s): LIPASE, AMYLASE in the last 168 hours. No  results for input(s): AMMONIA in the last 168 hours.  Coagulation Profile: Recent Labs  Lab 04/25/18 0418  INR 1.58    Cardiac Enzymes: No results for input(s): CKTOTAL, CKMB, CKMBINDEX, TROPONINI in the last 168 hours.  BNP (last 3 results) No results for input(s): PROBNP in the last 8760 hours.  HbA1C: No results for input(s): HGBA1C in the last 72 hours.  CBG: No results for input(s): GLUCAP in the last 168 hours.  Lipid Profile: No results for input(s): CHOL, HDL, LDLCALC, TRIG, CHOLHDL, LDLDIRECT in the last 72 hours.  Thyroid Function Tests: No results for input(s): TSH, T4TOTAL, FREET4, T3FREE, THYROIDAB in the last 72 hours.  Anemia Panel: No results for input(s): VITAMINB12, FOLATE, FERRITIN, TIBC, IRON, RETICCTPCT in the last 72 hours.  Urine analysis:    Component Value Date/Time   COLORURINE AMBER (A) 03/16/2018 0200   APPEARANCEUR CLEAR 03/16/2018 0200  LABSPEC 1.021 03/16/2018 0200   PHURINE 6.0 03/16/2018 0200   GLUCOSEU NEGATIVE 03/16/2018 0200   HGBUR NEGATIVE 03/16/2018 0200   BILIRUBINUR SMALL (A) 03/16/2018 0200   KETONESUR 20 (A) 03/16/2018 0200   PROTEINUR 100 (A) 03/16/2018 0200   NITRITE NEGATIVE 03/16/2018 0200   LEUKOCYTESUR NEGATIVE 03/16/2018 0200    Sepsis Labs: Lactic Acid, Venous    Component Value Date/Time   LATICACIDVEN 1.9 04/07/2018 0130    MICROBIOLOGY: No results found for this or any previous visit (from the past 240 hour(s)).  RADIOLOGY STUDIES/RESULTS: Ct Angio Chest Pe W Or Wo Contrast  Result Date: 04/22/2018 CLINICAL DATA:  Lung cancer with bony metastasis to the RIGHT shoulder and liver metastasis. EXAM: CT ANGIOGRAPHY CHEST CT ABDOMEN AND PELVIS WITH CONTRAST TECHNIQUE: Multidetector CT imaging of the chest was performed using the standard protocol during bolus administration of intravenous contrast. Multiplanar CT image reconstructions and MIPs were obtained to evaluate the vascular anatomy. Multidetector CT  imaging of the abdomen and pelvis was performed using the standard protocol during bolus administration of intravenous contrast. CONTRAST:  118mL ISOVUE-370 IOPAMIDOL (ISOVUE-370) INJECTION 76% COMPARISON:  CT 03/09/2018, 01/28/2018 FINDINGS: CTA CHEST FINDINGS Cardiovascular: There is narrowing of the branches of the LEFT lobe pulmonary arteries by a LEFT hilar tumor as well as narrowing lingular branch however there is no endoluminal filling defect suggest acute pulmonary embolism. No pulmonary embolism within the RIGHT lung either. Port in the anterior chest wall with tip in distal SVC. Mediastinum/Nodes: No axillary or supraclavicular enlarged nodes. No mediastinal lymphadenopathy. Esophagus is patulous scratch the esophagus is fluid-filled unchanged from prior. Lungs/Pleura: LEFT perihilar mass surrounding structures of the LEFT hilum again noted. There is complete obstruction of the LEFT lobe bronchus. There is new/increased LEFT lower lobe atelectasis. There is loculated effusion at the LEFT lung base new from prior. There is nodular thickening of the pleural space. Small RIGHT effusion.  Mild interstitial edema. Musculoskeletal: Destructive lesion involving the RIGHT scapula extending the soft tissue musculature of the RIGHT shoulder again noted. Review of the MIP images confirms the above findings. CT ABDOMEN and PELVIS FINDINGS Hepatobiliary: Round targetoid lesions within the LEFT and RIGHT hepatic lobe consistent metastasis not changed from prior. Gallbladder is distended to 4.7 cm. Common bile duct normal caliber. Pancreas: Pancreas is normal. No ductal dilatation. No pancreatic inflammation. Spleen: Normal spleen Adrenals/urinary tract: The thickening of the RIGHT adrenal gland to 11 mm consistent with metastasis. No change. Kidneys, ureters bladder normal Stomach/Bowel: Stomach is normal. Fluid within the esophagus noted. There is dilatation of the duodenum and proximal small bowel. Air-fluid levels  within the small bowel. The small bowel mild distension (approximately 3.5 cm) is uniform and extends into the distal small bowel with there is caliber change leading up to the terminal ileum. There is no clear transition point identified. No pneumatosis. No portal venous gas. Vascular/Lymphatic: Abdominal aorta normal. Retroperitoneal lymph node LEFT aorta increased in size measuring 12 mm (image 34/12 Reproductive: Prostate normal Other: No free fluid. Musculoskeletal: The sclerotic lesions in the lumbar spine consistent metastasis more prominent than prior (image 80/16 involving the L3 and L4 vertebral bodies. Aggressive RIGHT scapular lesion again noted. Review of the MIP images confirms the above findings. IMPRESSION: Chest Impression: 1. No evidence acute pulmonary embolism. 2. Interval increase in RIGHT hilar malignancy constricting hilar structures including the pulmonary arteries and particularly the LEFT lower lobe bronchus. 3. LEFT lower bronchus is completely constricted with new atelectasis of the LEFT lower  lobe. New loculated pleural effusion and nodularity in the LEFT hemithorax concerning for pleural spread of metastasis. 4. Aggressive bulky lytic lesion in the RIGHT scapula extending into periscapular musculature Abdomen / Pelvis Impression: 1. SMALL BOWEL OBSTRUCTION PATTERN: New moderate dilatation small bowel involving the jejunum and moderate portion of the ileum. A caliber change in the distal ileum. No transition point identified. Differential of ileus versus early small bowel obstruction. Fluid in the duodenum is concerning for SMALL BOWEL OBSTRUCTION. 2. Stable hepatic metastasis. 3. Interval increase in adenopathy in the LEFT periaortic retroperitoneum. 4. Stable RIGHT adrenal metastasis. These results will be called to the ordering clinician or representative by the Radiologist Assistant, and communication documented in the PACS or zVision Dashboard. Electronically Signed   By: Suzy Bouchard M.D.   On: 04/22/2018 14:08   Ct Abdomen Pelvis W Contrast  Result Date: 04/22/2018 CLINICAL DATA:  Lung cancer with bony metastasis to the RIGHT shoulder and liver metastasis. EXAM: CT ANGIOGRAPHY CHEST CT ABDOMEN AND PELVIS WITH CONTRAST TECHNIQUE: Multidetector CT imaging of the chest was performed using the standard protocol during bolus administration of intravenous contrast. Multiplanar CT image reconstructions and MIPs were obtained to evaluate the vascular anatomy. Multidetector CT imaging of the abdomen and pelvis was performed using the standard protocol during bolus administration of intravenous contrast. CONTRAST:  160mL ISOVUE-370 IOPAMIDOL (ISOVUE-370) INJECTION 76% COMPARISON:  CT 03/09/2018, 01/28/2018 FINDINGS: CTA CHEST FINDINGS Cardiovascular: There is narrowing of the branches of the LEFT lobe pulmonary arteries by a LEFT hilar tumor as well as narrowing lingular branch however there is no endoluminal filling defect suggest acute pulmonary embolism. No pulmonary embolism within the RIGHT lung either. Port in the anterior chest wall with tip in distal SVC. Mediastinum/Nodes: No axillary or supraclavicular enlarged nodes. No mediastinal lymphadenopathy. Esophagus is patulous scratch the esophagus is fluid-filled unchanged from prior. Lungs/Pleura: LEFT perihilar mass surrounding structures of the LEFT hilum again noted. There is complete obstruction of the LEFT lobe bronchus. There is new/increased LEFT lower lobe atelectasis. There is loculated effusion at the LEFT lung base new from prior. There is nodular thickening of the pleural space. Small RIGHT effusion.  Mild interstitial edema. Musculoskeletal: Destructive lesion involving the RIGHT scapula extending the soft tissue musculature of the RIGHT shoulder again noted. Review of the MIP images confirms the above findings. CT ABDOMEN and PELVIS FINDINGS Hepatobiliary: Round targetoid lesions within the LEFT and RIGHT hepatic lobe  consistent metastasis not changed from prior. Gallbladder is distended to 4.7 cm. Common bile duct normal caliber. Pancreas: Pancreas is normal. No ductal dilatation. No pancreatic inflammation. Spleen: Normal spleen Adrenals/urinary tract: The thickening of the RIGHT adrenal gland to 11 mm consistent with metastasis. No change. Kidneys, ureters bladder normal Stomach/Bowel: Stomach is normal. Fluid within the esophagus noted. There is dilatation of the duodenum and proximal small bowel. Air-fluid levels within the small bowel. The small bowel mild distension (approximately 3.5 cm) is uniform and extends into the distal small bowel with there is caliber change leading up to the terminal ileum. There is no clear transition point identified. No pneumatosis. No portal venous gas. Vascular/Lymphatic: Abdominal aorta normal. Retroperitoneal lymph node LEFT aorta increased in size measuring 12 mm (image 34/12 Reproductive: Prostate normal Other: No free fluid. Musculoskeletal: The sclerotic lesions in the lumbar spine consistent metastasis more prominent than prior (image 80/16 involving the L3 and L4 vertebral bodies. Aggressive RIGHT scapular lesion again noted. Review of the MIP images confirms the above findings. IMPRESSION: Chest  Impression: 1. No evidence acute pulmonary embolism. 2. Interval increase in RIGHT hilar malignancy constricting hilar structures including the pulmonary arteries and particularly the LEFT lower lobe bronchus. 3. LEFT lower bronchus is completely constricted with new atelectasis of the LEFT lower lobe. New loculated pleural effusion and nodularity in the LEFT hemithorax concerning for pleural spread of metastasis. 4. Aggressive bulky lytic lesion in the RIGHT scapula extending into periscapular musculature Abdomen / Pelvis Impression: 1. SMALL BOWEL OBSTRUCTION PATTERN: New moderate dilatation small bowel involving the jejunum and moderate portion of the ileum. A caliber change in the distal  ileum. No transition point identified. Differential of ileus versus early small bowel obstruction. Fluid in the duodenum is concerning for SMALL BOWEL OBSTRUCTION. 2. Stable hepatic metastasis. 3. Interval increase in adenopathy in the LEFT periaortic retroperitoneum. 4. Stable RIGHT adrenal metastasis. These results will be called to the ordering clinician or representative by the Radiologist Assistant, and communication documented in the PACS or zVision Dashboard. Electronically Signed   By: Suzy Bouchard M.D.   On: 04/22/2018 14:08   Ir Fluoro Rm 30-60 Min  Result Date: 04/25/2018 INDICATION: History metastatic lung cancer, now with chronic enteric obstructive symptoms. Request made for placement of a percutaneous gastrostomy tube for venting purposes. EXAM: IR FLUORO RM 0-60 MIN COMPARISON:  None. MEDICATIONS: Zofran 4 mg IV CONTRAST:  None ANESTHESIA/SEDATION: None FLUOROSCOPY TIME:  None COMPLICATIONS: None immediate. PROCEDURE: Informed written consent was obtained from the patient following explanation of the procedure, risks, benefits and alternatives. Despite obtaining consent from the patient and the patient's wife prior to the procedure, once placed supine on the fluoroscopy table, the patient experienced nausea and several episodes of vomiting and despite being administered Zofran, ultimately elected not to pursue gastrostomy tube placement. This was discussed at great length with both the patient the patient's wife however ultimately, the patient, who (per his wife) maintains medical decision making capacity, refuses to undergo gastrostomy tube placement at the present time. No procedure attempted. FINDINGS: Patient refusal as detailed above. IMPRESSION: Patient refused percutaneous gastrostomy tube placement. No procedure attempted. Electronically Signed   By: Sandi Mariscal M.D.   On: 04/25/2018 13:52   Dg Abd 2 Views  Result Date: 04/24/2018 CLINICAL DATA:  Nausea, vomiting, small bowel  obstruction. EXAM: ABDOMEN - 2 VIEW COMPARISON:  Radiographs of April 21, 2018. CT scan of April 22, 2018. FINDINGS: Mildly dilated small bowel loops are noted concerning for distal small bowel obstruction or ileus. No colonic dilatation is noted. Stool is noted in the rectum. There is no evidence of free air. No radio-opaque calculi or other significant radiographic abnormality is seen. IMPRESSION: Mildly dilated small bowel loops are noted concerning for distal small bowel obstruction or ileus. Electronically Signed   By: Marijo Conception, M.D.   On: 04/24/2018 12:54   Dg Abd 2 Views  Result Date: 04/21/2018 CLINICAL DATA:  Persistent vomiting, weakness, history of coronary artery disease post MI, metastatic lung cancer, hypertension, stroke EXAM: ABDOMEN - 2 VIEW COMPARISON:  CT abdomen and pelvis 03/09/2018 FINDINGS: Numerous air-filled distended loops of small bowel throughout abdomen. Small amounts of stool within colon and rectum. Paucity of colonic gas. No definite bowel wall thickening or free air. Gaseous distention of stomach. No acute osseous findings. IMPRESSION: Numerous distended loops of small bowel throughout the abdomen with paucity of colonic gas and mild scattered stool in colon, question small-bowel obstruction versus ileus. No evidence of perforation. Electronically Signed   By: Lavonia Dana  M.D.   On: 04/21/2018 12:55   Dg Abd Portable 1v  Result Date: 04/26/2018 CLINICAL DATA:  Follow up small bowel obstruction EXAM: PORTABLE ABDOMEN - 1 VIEW COMPARISON:  04/25/2018 FINDINGS: Scattered large and small bowel gas is again identified. Considerable retained fecal material is noted within the left colon. Small bowel dilatation is again identified stable from the previous exam. No new focal abnormality is noted. IMPRESSION: Stable small bowel dilatation likely related to underlying ileus. Retained fecal material within the left colon. Electronically Signed   By: Inez Catalina M.D.   On:  04/26/2018 08:38   Dg Abd Portable 1v  Result Date: 04/25/2018 CLINICAL DATA:  Small bowel obstruction. EXAM: PORTABLE ABDOMEN - 1 VIEW COMPARISON:  Radiographs of April 21, 2018. FINDINGS: Small bowel dilatation noted on prior exam is significantly improved, with residual mildly dilated loop seen in the left upper quadrant, most consistent with improving ileus. Phleboliths are noted in the pelvis. No nephrolithiasis is noted. IMPRESSION: Significantly improved small bowel dilatation is noted most consistent with improving ileus. Electronically Signed   By: Marijo Conception, M.D.   On: 04/25/2018 14:51     LOS: 6 days   Oren Binet, MD  [facility]  If 7PM-7AM, please contact night-coverage  Please page via [email or web address]-Password [email or web address]-click on MD name and type text message  04/27/2018, 10:42 AM

## 2018-04-27 NOTE — Progress Notes (Addendum)
NO CHARGE NOTE:  Upon arrival to room, patient having bowel movement. Spoke with wife in hall. She reported his pain is well controlled and he is not having any nausea. Discussed with her the medication changes (addition of decadron and increase in protonix), she provided understanding.  Wife stated that it would be best if we followed up tomorrow to speak with [facility] further about [place]. Encouraged patient and wife to call with any questions or concerns.  Ferrel Logan, PA student  Ihor Dow, FNP-C Palliative Medicine Team  Phone: [phone] Fax: [phone]

## 2018-04-27 NOTE — Progress Notes (Signed)
PT Cancellation Note  Patient Details Name: Ian Roberts MRN: [id number] DOB: 1961-02-11   Cancelled Treatment:    Reason Eval/Treat Not Completed: Patient at procedure or test/unavailable.  On the 1st attempt, pt was attempting to have a bowel movement.  2 ne attempt pt was tired. 04/27/2018  Donnella Sham, PT Acute Rehabilitation Services [phone]  (pager) [phone]  (office)   [name] 04/27/2018, 6:25 PM

## 2018-04-27 NOTE — Progress Notes (Signed)
[facility]/Trauma Progress Note      Assessment/Plan HTN HLD CAD, Hx of STEMI in 2010 s/p stent placement Hx of CVA Metastatic adenocarcinoma of lung - mets to brain, liver, right clavicle  Severe protein calorie malnutrition/Failure to thrive SBO vs ileus - abdominal film02/03 showed improved dilatation of small bowel, 02/04 AXR showed stable small bowel dilatation likely ileus  -Pt refused IR PEG - palliativeteam following -he refused suppository yesterday, discussed trying it today - pt will need NGT if he has N/V - we will follow  FEN: CLD, breeze VTE: SCD's, lovenox ID: Ancef 02/03-02/04 Follow up: TBD   LOS: 6 days    Subjective: CC: SBO vs ileus  Pt refused suppository yesterday but wouldn't tell me why. I explained his stool burden and the importance having a BM. He tried to negotiate removal of foley. I declined his request. Wife at bedside.   Objective: Vital signs in last 24 hours: Temp:  [97.6 F (36.4 C)-98.5 F (36.9 C)] 97.6 F (36.4 C) (02/05 0459) Pulse Rate:  [97-102] 97 (02/05 0459) Resp:  [17-19] 17 (02/05 0459) BP: (116-119)/(81-86) 119/81 (02/05 0459) SpO2:  [97 %-98 %] 97 % (02/05 0459) Last BM Date: 04/19/18  Intake/Output from previous day: 02/04 0701 - 02/05 0700 In: -  Out: 1155 [Urine:1155] Intake/Output this shift: No intake/output data recorded.  PE: Gen: Alert, lethargic, ill appearing Pulm:Rate andeffort normal Abd: Soft, ND, few BS, TTP of R side of abdomen, no guarding, no peritonitis Skin: warm and dry    Anti-infectives: Anti-infectives (From admission, onward)   Start     Dose/Rate Route Frequency Ordered Stop   04/25/18 1250  ceFAZolin (ANCEF) 2-4 GM/100ML-% IVPB    Note to Pharmacy:  Manuela Neptune   : cabinet override      04/25/18 1250 04/26/18 0059   04/25/18 0615  ceFAZolin (ANCEF) IVPB 2g/100 mL premix     2 g 200 mL/hr over 30 Minutes Intravenous To Radiology 04/25/18 0606  04/25/18 1329      Lab Results:  Recent Labs    04/25/18 0418 04/27/18 0354  WBC 13.0* 10.4  HGB 9.9* 9.4*  HCT 31.7* 29.5*  PLT 260 177   BMET Recent Labs    04/26/18 0437 04/27/18 0354  NA 140 137  K 3.1* 3.9  CL 110 109  CO2 19* 21*  GLUCOSE 89 102*  BUN 12 10  CREATININE 0.70 0.55*  CALCIUM 7.3* 7.3*   PT/INR Recent Labs    04/25/18 0418  LABPROT 18.7*  INR 1.58   CMP     Component Value Date/Time   NA 137 04/27/2018 0354   K 3.9 04/27/2018 0354   CL 109 04/27/2018 0354   CO2 21 (L) 04/27/2018 0354   GLUCOSE 102 (H) 04/27/2018 0354   BUN 10 04/27/2018 0354   CREATININE 0.55 (L) 04/27/2018 0354   CALCIUM 7.3 (L) 04/27/2018 0354   PROT 6.1 (L) 04/22/2018 0414   ALBUMIN 1.8 (L) 04/22/2018 0414   AST 11 (L) 04/22/2018 0414   ALT 10 04/22/2018 0414   ALKPHOS 207 (H) 04/22/2018 0414   BILITOT 1.9 (H) 04/22/2018 0414   GFRNONAA >60 04/27/2018 0354   GFRAA >60 04/27/2018 0354   Lipase     Component Value Date/Time   LIPASE 25 03/09/2018 0013    Studies/Results: Ir Fluoro Rm 30-60 Min  Result Date: 04/25/2018 INDICATION: History metastatic lung cancer, now with chronic enteric obstructive symptoms. Request made for placement of a percutaneous gastrostomy  tube for venting purposes. EXAM: IR FLUORO RM 0-60 MIN COMPARISON:  None. MEDICATIONS: Zofran 4 mg IV CONTRAST:  None ANESTHESIA/SEDATION: None FLUOROSCOPY TIME:  None COMPLICATIONS: None immediate. PROCEDURE: Informed written consent was obtained from the patient following explanation of the procedure, risks, benefits and alternatives. Despite obtaining consent from the patient and the patient's wife prior to the procedure, once placed supine on the fluoroscopy table, the patient experienced nausea and several episodes of vomiting and despite being administered Zofran, ultimately elected not to pursue gastrostomy tube placement. This was discussed at great length with both the patient the patient's wife  however ultimately, the patient, who (per his wife) maintains medical decision making capacity, refuses to undergo gastrostomy tube placement at the present time. No procedure attempted. FINDINGS: Patient refusal as detailed above. IMPRESSION: Patient refused percutaneous gastrostomy tube placement. No procedure attempted. Electronically Signed   By: Sandi Mariscal M.D.   On: 04/25/2018 13:52   Dg Abd Portable 1v  Result Date: 04/26/2018 CLINICAL DATA:  Follow up small bowel obstruction EXAM: PORTABLE ABDOMEN - 1 VIEW COMPARISON:  04/25/2018 FINDINGS: Scattered large and small bowel gas is again identified. Considerable retained fecal material is noted within the left colon. Small bowel dilatation is again identified stable from the previous exam. No new focal abnormality is noted. IMPRESSION: Stable small bowel dilatation likely related to underlying ileus. Retained fecal material within the left colon. Electronically Signed   By: Inez Catalina M.D.   On: 04/26/2018 08:38   Dg Abd Portable 1v  Result Date: 04/25/2018 CLINICAL DATA:  Small bowel obstruction. EXAM: PORTABLE ABDOMEN - 1 VIEW COMPARISON:  Radiographs of April 21, 2018. FINDINGS: Small bowel dilatation noted on prior exam is significantly improved, with residual mildly dilated loop seen in the left upper quadrant, most consistent with improving ileus. Phleboliths are noted in the pelvis. No nephrolithiasis is noted. IMPRESSION: Significantly improved small bowel dilatation is noted most consistent with improving ileus. Electronically Signed   By: Marijo Conception, M.D.   On: 04/25/2018 14:51      Kalman Drape , [facility] Surgery 04/27/2018, 8:48 AM  Pager: [phone] Mon-Wed, Friday 7:00am-4:30pm Thurs 7am-11:30am  Consults: [phone]

## 2018-04-28 ENCOUNTER — Inpatient Hospital Stay (HOSPITAL_COMMUNITY): Payer: Medicaid Other

## 2018-04-28 DIAGNOSIS — K567 Ileus, unspecified: Secondary | ICD-10-CM

## 2018-04-28 LAB — COMPREHENSIVE METABOLIC PANEL
ALBUMIN: 1.7 g/dL — AB (ref 3.5–5.0)
ALT: 11 U/L (ref 0–44)
AST: 18 U/L (ref 15–41)
Alkaline Phosphatase: 215 U/L — ABNORMAL HIGH (ref 38–126)
Anion gap: 12 (ref 5–15)
BUN: 8 mg/dL (ref 6–20)
CHLORIDE: 104 mmol/L (ref 98–111)
CO2: 19 mmol/L — ABNORMAL LOW (ref 22–32)
Calcium: 7.4 mg/dL — ABNORMAL LOW (ref 8.9–10.3)
Creatinine, Ser: 0.54 mg/dL — ABNORMAL LOW (ref 0.61–1.24)
GFR calc Af Amer: >60 mL/min (ref >60)
GFR calc non Af Amer: >60 mL/min (ref >60)
Glucose, Bld: 93 mg/dL (ref 70–99)
Potassium: 3.5 mmol/L (ref 3.5–5.1)
Sodium: 135 mmol/L (ref 135–145)
Total Bilirubin: 1.2 mg/dL (ref 0.3–1.2)
Total Protein: 5.6 g/dL — ABNORMAL LOW (ref 6.5–8.1)

## 2018-04-28 LAB — MAGNESIUM: MAGNESIUM: 1.8 mg/dL (ref 1.7–2.4)

## 2018-04-28 MED ORDER — SORBITOL 70 % SOLN
960.0000 mL | TOPICAL_OIL | Freq: Once | ORAL | Status: AC
  Administered 2018-04-28: 960 mL via RECTAL
  Filled 2018-04-28: qty 473

## 2018-04-28 MED ORDER — TAMSULOSIN HCL 0.4 MG PO CAPS
0.4000 mg | ORAL_CAPSULE | Freq: Every day | ORAL | Status: DC
  Administered 2018-04-28 – 2018-04-29 (×2): 0.4 mg via ORAL
  Filled 2018-04-28 (×3): qty 1

## 2018-04-28 MED ORDER — POLYETHYLENE GLYCOL 3350 17 G PO PACK
17.0000 g | PACK | Freq: Two times a day (BID) | ORAL | Status: DC
  Administered 2018-04-28 – 2018-04-30 (×3): 17 g via ORAL
  Filled 2018-04-28 (×4): qty 1

## 2018-04-28 MED ORDER — DOCUSATE SODIUM 100 MG PO CAPS
100.0000 mg | ORAL_CAPSULE | Freq: Every day | ORAL | Status: DC
  Administered 2018-04-28: 100 mg via ORAL
  Filled 2018-04-28 (×2): qty 1

## 2018-04-28 MED ORDER — POTASSIUM CHLORIDE CRYS ER 20 MEQ PO TBCR
40.0000 meq | EXTENDED_RELEASE_TABLET | Freq: Once | ORAL | Status: AC
  Administered 2018-04-28: 40 meq via ORAL
  Filled 2018-04-28: qty 2

## 2018-04-28 MED ORDER — MAGNESIUM SULFATE 2 GM/50ML IV SOLN
2.0000 g | Freq: Once | INTRAVENOUS | Status: AC
  Administered 2018-04-28: 2 g via INTRAVENOUS

## 2018-04-28 NOTE — Progress Notes (Signed)
Occupational Therapy Treatment Patient Details Name: Ian Roberts MRN: [id number] DOB: 1960/08/08 Today's Date: 04/28/2018    History of present illness 58 year old male with history of metastatic left lung adenocarcinoma with metastases to the brain, liver and shoulder presented with dehydration, vomiting, fatigue and poor oral intake.  He was supposed to have PEG tube placement as an outpatient on 04/20/2018 but got admitted because of severe dehydration and hypokalemia.   OT comments  Today Pt required max encouragement for participation in therapy. Pt actively pooping in the bed, and would only perform BSC transfer and LB cleaning because the therapy team would not leave him lying in his stool (also just cleaned by RN staff prior to session). Pt was mod +2 for STP and max A +2 for peri care in standing. Pt with deconditioning, generalized weakness, pain, confusion, and will require increased level of care at dc. Changed recommendation to SNF for safety as Pt requires assist for all OOB activity at this time. OT will continue to follow acutely.    Follow Up Recommendations  SNF;Supervision/Assistance - 24 hour    Equipment Recommendations  Other (comment)(defer to next venue of care)    Recommendations for Other Services      Precautions / Restrictions Precautions Precautions: Fall Restrictions Weight Bearing Restrictions: No       Mobility Bed Mobility Overal bed mobility: Needs Assistance Bed Mobility: Supine to Sit;Sit to Supine     Supine to sit: Mod assist;+2 for physical assistance Sit to supine: Mod assist;+2 for physical assistance   General bed mobility comments: Mod A + 2 for LE and trunk management; initially with Min A for upright balance able to progress to supervision  Transfers Overall transfer level: Needs assistance Equipment used: 2 person hand held assist Transfers: Sit to/from [facility] Sit to Stand: Mod assist;Max assist;+2 physical  assistance Stand pivot transfers: Mod assist;Max assist;+2 physical assistance       General transfer comment: Mod/Max +2 for all OOB transfers with poor safety awareness, poor balance; limited weight shifting and LE advancement increasing his fall risk    Balance Overall balance assessment: Needs assistance Sitting-balance support: Feet supported Sitting balance-Leahy Scale: Fair Sitting balance - Comments: fair once set   Standing balance support: Bilateral upper extremity supported;During functional activity Standing balance-Leahy Scale: Poor Standing balance comment: knee blocking with +2 HHA                           ADL either performed or assessed with clinical judgement   ADL Overall ADL's : Needs assistance/impaired     Grooming: Wash/dry hands;Set up;Sitting       Lower Body Bathing: Maximal assistance;Sitting/lateral leans Lower Body Bathing Details (indicate cue type and reason): washed legs due to stool while sitting on the [facility]     Lower Body Dressing: Maximal assistance;Bed level Lower Body Dressing Details (indicate cue type and reason): refused to participate in LB dressing at this time, socks changed due to stool Toilet Transfer: Stand-pivot;Moderate assistance;Maximal assistance;+2 for safety/equipment Toilet Transfer Details (indicate cue type and reason): max initially, mod on 2nd attempt to return to bed Toileting- Clothing Manipulation and Hygiene: Maximal assistance;+2 for physical assistance;+2 for safety/equipment;Sit to/from stand Toileting - Clothing Manipulation Details (indicate cue type and reason): Pt able to hold standing for 10 seconds for rear peri care - but complaining "I am not going to stand much longer"       General ADL  Comments: Pt very resistive to therapy at this time.     Vision       Perception     Praxis      Cognition Arousal/Alertness: Awake/alert Behavior During Therapy: Flat affect Overall Cognitive  Status: Impaired/Different from baseline Area of Impairment: Following commands;Problem solving;Safety/judgement                       Following Commands: Follows one step commands with increased time Safety/Judgement: Decreased awareness of safety;Decreased awareness of deficits   Problem Solving: Decreased initiation;Difficulty sequencing;Slow processing;Requires verbal cues;Requires tactile cues General Comments: slow processing requiring consistent cueing, hyper focused on phone, was going to be "ok" laying in pool of feces - he had to be told that we would not allow him to lie in his own stool        Exercises     Shoulder Instructions       General Comments no family present during session    Pertinent Vitals/ Pain       Pain Assessment: Faces Faces Pain Scale: Hurts little more Pain Location: generalized with mobility Pain Descriptors / Indicators: Grimacing;Guarding Pain Intervention(s): Limited activity within patient's tolerance;Monitored during session;Repositioned  Home Living                                          Prior Functioning/Environment              Frequency  Min 2X/week        Progress Toward Goals  OT Goals(current goals can now be found in the care plan section)  Progress towards OT goals: Not progressing toward goals - comment(lethargic, does not want to participate today)  Acute Rehab OT Goals Patient Stated Goal: walk again OT Goal Formulation: With patient/family Time For Goal Achievement: 05/06/18 Potential to Achieve Goals: [place] Discharge plan needs to be updated;Frequency remains appropriate    Co-evaluation    PT/OT/SLP Co-Evaluation/Treatment: Yes Reason for Co-Treatment: Complexity of the patient's impairments (multi-system involvement);For patient/therapist safety;To address functional/ADL transfers;Other (comment)(activity tolerance) PT goals addressed during session: Mobility/safety  with mobility;Proper use of DME;Balance;Strengthening/ROM OT goals addressed during session: ADL's and self-care;Proper use of Adaptive equipment and DME;Strengthening/ROM      AM-PAC OT "6 Clicks" Daily Activity     Outcome Measure   Help from another person eating meals?: None Help from another person taking care of personal grooming?: A Little Help from another person toileting, which includes using toliet, bedpan, or urinal?: A Lot Help from another person bathing (including washing, rinsing, drying)?: A Lot Help from another person to put on and taking off regular upper body clothing?: A Lot Help from another person to put on and taking off regular lower body clothing?: A Lot 6 Click Score: 15    End of Session Equipment Utilized During Treatment: Gait belt  OT Visit Diagnosis: Unsteadiness on feet (R26.81);Other abnormalities of gait and mobility (R26.89);Muscle weakness (generalized) (M62.81);Other symptoms and signs involving cognitive function   Activity Tolerance Patient limited by lethargy   Patient Left in bed;with call bell/phone within reach   Nurse Communication Mobility status        Time: 5409-8119 OT Time Calculation (min): 40 min  Charges: OT General Charges $OT Visit: 1 Visit OT Treatments $Self Care/Home Management : 8-22 mins  Hulda Humphrey OTR/L [place] Pager: [phone] Office: [phone]  [place] 04/28/2018, 1:49 PM

## 2018-04-28 NOTE — Progress Notes (Addendum)
Daily Progress Note   Patient Name: Ian Roberts       Date: 04/28/2018 DOB: 14-Jan-1961  Age: 58 y.o. MRN#: [id number] Attending Physician: Ian Osgood, MD Primary Care Physician: Ian Burly, MD Admit Date: 04/20/2018  Reason for Consultation/Follow-up: Establishing goals of care  Subjective/GOC:  AM: Patient awake, alert, oriented. Denies pain or nausea. Nursing at bedside to give enema. Patient has been tolerating liquids. Minimally engages in conversation. No family at bedside.   F/u this afternoon. Patient resting. Again, no family at bedside.  Addendum [id number]: Spoke with wife and daughter via telephone regarding plan of care including diagnoses and interventions. Discussed home health versus hospice services. Wife and daughter are adamant that they are "not giving up on Ian Roberts" and are NOT interested in hospice services. They will do whatever is possible to make sure he has 24 hour care. They wish to f/u with outpatient oncology to further discuss plan of care.   Daughter, Ian Roberts asks about feeding tube placement. Explained that he refused the other day and if they are wishing for him to have PEG tube placement prior to discharge, they need to further talk with Ian Roberts about this and make decision by tomorrow morning. Ian Roberts tells me he refused because he was nauseous and possibly pain medications were affecting his ability to make decision. Also explained that his ileus/SBO is still resolving and may not be appropriate to feed him via PEG tube yet. Family confirms understanding.   They even asked about TPN if it came to this and speak of a friend that had TPN with PICC for three months and she is back to normal "like it never happened." Explained that this would likely not be an  option due to indication/cost.   Ian Roberts does acknowledge that if Ian Roberts declines a feeding tube and continues to have poor nutritional status, "hospice will be the only option." I agreed with her and further explained hospice philosophy.   Ian Roberts hope to further discuss with him tonight. They have PMT contact information if questions or concerns through the weekend.   Length of Stay: 7  Current Medications: Scheduled Meds:  . dexamethasone  4 mg Intravenous Daily  . docusate sodium  100 mg Oral Daily  . enoxaparin (LOVENOX) injection  40 mg Subcutaneous Q24H  .  feeding supplement  1 Container Oral TID BM  . mouth rinse  15 mL Mouth Rinse BID  . pantoprazole (PROTONIX) IV  40 mg Intravenous Q12H  . polyethylene glycol  17 g Oral BID  . scopolamine  1 patch Transdermal Q72H  . sodium chloride flush  10-40 mL Intracatheter Q12H  . tamsulosin  0.4 mg Oral Daily    Continuous Infusions: . dextrose 5 % and 0.45 % NaCl with KCl 40 mEq/L 50 mL/hr at 04/28/18 1032    PRN Meds: acetaminophen **OR** acetaminophen, HYDROmorphone (DILAUDID) injection, ondansetron **OR** ondansetron (ZOFRAN) IV, prochlorperazine, promethazine, sodium chloride flush, zolpidem  Physical Exam Vitals signs and nursing note reviewed.  Constitutional:      Appearance: He is cachectic. He is ill-appearing.  HENT:     Head: Normocephalic and atraumatic.  Pulmonary:     Effort: Pulmonary effort is normal. No tachypnea, accessory muscle usage or respiratory distress.  Abdominal:     Tenderness: There is no abdominal tenderness.  Skin:    General: Skin is warm and dry.     Coloration: Skin is pale.  Neurological:     Mental Status: He is alert and easily aroused.     Comments: Poorly engaged in conversation.  Psychiatric:        Speech: Speech is delayed.          Vital Signs: BP 128/90 (BP Location: Left Arm)   Pulse (!) 106   Temp 98.8 F (37.1 C)   Resp 19   Ht 5\' 11"  (1.803 m)   Wt 70 kg    SpO2 96%   BMI 21.52 kg/m  SpO2: SpO2: 96 % O2 Device: O2 Device: Room Air O2 Flow Rate:    Intake/output summary:   Intake/Output Summary (Last 24 hours) at 04/28/2018 1352 Last data filed at 04/28/2018 0900 Gross per 24 hour  Intake 260 ml  Output 600 ml  Net -340 ml   LBM: Last BM Date: 04/19/18 Baseline Weight: Weight: 70 kg Most recent weight: Weight: 70 kg       Palliative Assessment/Data: PPS: 30%   Flowsheet Rows     Most Recent Value  Intake Tab  Referral Department  Hospitalist  Unit at Time of Referral  ER  Palliative Care Primary Diagnosis  Cancer  Date Notified  04/20/18  Palliative Care Type  New Palliative care  Reason for referral  Clarify Goals of Care  Date of Admission  04/20/18  Date first seen by Palliative Care  04/21/18  # of days Palliative referral response time  1 Day(s)  # of days IP prior to Palliative referral  0  Clinical Assessment  Palliative Performance Scale Score  30%  Psychosocial & Spiritual Assessment  Palliative Care Outcomes  Patient/Family meeting held?  Yes  Who was at the meeting?  patient, wife, daughter  Palliative Care Outcomes  Clarified goals of care, Provided end of life care assistance, Provided psychosocial or spiritual support, ACP counseling assistance, Improved pain interventions, Improved non-pain symptom therapy      Patient Active Problem List   Diagnosis Date Noted  . Advanced care planning/counseling discussion   . Metastatic cancer ([place])   . SBO (small bowel obstruction) ([place])   . Protein-calorie malnutrition, severe 04/21/2018  . Pressure injury of skin 04/21/2018  . Palliative care by specialist   . Weakness   . Dehydration   . Abdominal pain   . Hypokalemia 04/20/2018  . Goals of care, counseling/discussion 02/22/2018  . Primary  malignant neoplasm of lung metastatic to other site [facility]) 01/31/2018  . DYSLIPIDEMIA 06/05/2008  . HYPERTENSION, BENIGN ESSENTIAL 06/05/2008  . ACUT MI OTH INF WALL  SUBSQT EPIS CARE 06/05/2008  . CORONARY ATHEROSCLEROSIS NATIVE CORONARY ARTERY 06/05/2008    Palliative Care Assessment & Plan   Patient Profile: 59 y.o.malewith past medical history of left lung adenocarcinoma with metastases to brain, liver, right scapula , CVA 2017, STEMI, HTN, CAD, necrotizing perirectal abscessadmitted on 1/29/2020when scheduled for PEG tube placement but found to be severely dehydrated and hypokalemic. IVF initiated. Diagnosed with metastatic lung cancer in November 2019 s/p two rounds of chemotherapy and whole brain radiation. This admission, patient with intractable nausea and vomiting. Found to have SBO vs ileus. Palliative medicine consultation for goals of care.  Assessment/Recommendations/Plan   Continue current medications for symptom management.  Wife/daughter asking about PEG tube placement. I explained that they need to further discuss with [facility] since he previously refused PEG tube placement this week.   Wife/daughter absolutely do NOT want hospice services on discharge. They are agreeable to outpatient home health services. They plan to follow-up with outpatient oncology.   May benefit from outpatient palliative referral.   Goals of Care and Additional Recommendations:   Limitations on Scope of Treatment: Full Scope Treatment  Code Status:  DNR  Prognosis:   Poor prognosis d/t advanced cancer progressing through treatment, significant changes in cognitive, functional and nutritional status  Discharge Planning:  To Be Determined  Care plan was discussed with patient, RN, wife/daughter, RN CM, updated Dr. Sloan Leiter  Thank you for allowing the Palliative Medicine Team to assist in the care of this patient.   Time In: 1010- 1600- Time Out: 1020 1630 Total Time 40 Prolonged Time Billed no      Greater than 50%  of this time was spent counseling and coordinating care related to the above assessment and plan.  Ihor Dow,  FNP-C Palliative Medicine Team  Phone: [phone] Fax: [phone]  Please contact Palliative Medicine Team phone at [phone] for questions and concerns.

## 2018-04-28 NOTE — Progress Notes (Addendum)
PROGRESS NOTE        PATIENT DETAILS Name: Ian Roberts Age: 58 y.o. Sex: male Date of Birth: Aug 02, 1960 Admit Date: 04/20/2018 Admitting Physician Merton Border, MD FVC:[id number], Samul Dada, MD  Brief Narrative: Patient is a 58 y.o. male with history of widespread metastatic lung adenocarcinoma-presented with several day history of vomiting, fatigue and poor oral intake, was found to have small bowel obstruction and admitted to the hospitalist service.  Subjective: Sleeping comfortably when I walked in-was slightly confused but then easily reoriented.  Family members at bedside-Per nursing staff-patient had a small bowel movement yesterday following enema.  Denies any abdominal pain.  Denies any vomiting  Assessment/Plan: Small bowel obstruction: No vomiting-had a small BM following enema yesterday.  General surgery planning on repeating a enema today.  Denies any abdominal pain-tolerating liquids.  Continue supportive care-General surgery following.  Plans are to refrain from placing a NG tube as much as possible-as it was difficult to place a few days back.    Dehydration: Improved with IV fluid hydration-this is secondary to poor oral intake and small bowel obstruction.  Follow volume status closely.    Hypokalemia/hypomagnesemia: We will continue to try and keep potassium more than 4 and magnesium more than 2-recheck electrolytes tomorrow after repleting today.    Acute urinary retention: Occurred on 2/5 morning-Foley catheter placed-start Flomax-we will attempt voiding trial prior to discharge.  Severe failure to thrive syndrome: Secondary to advanced metastatic cancer-very frail-most recent albumin 1.7.  Had a long discussion with patient and spouse yesterday-both of them understand poor overall prognosis.  Patient refused PEG tube placement a few days back.  Per spouse-patient has had significant functional decline in the past 3-4 weeks and now is not able to  ambulate independently.  Per spouse patient has also lost a significant amount of weight.  Continue attempts to slowly increase diet, ambulate/PT evaluation.  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  Prognosis is very poor-reviewed most recent outpatient oncology note-if he does not improve-may need to be transitioned to comfort care at some point.  Chronic pain syndrome: Unable to take oral narcotics-pain appears to be well controlled with IV Dilaudid-constipation may be making ileus/SBO worse-and hence getting enema today again   DVT Prophylaxis: Prophylactic Lovenox   Code Status: DNR  Family Communication: None at bedside  Disposition Plan: Remain inpatient-SNF or hospice on discharge  Antimicrobial agents: Anti-infectives (From admission, onward)   Start     Dose/Rate Route Frequency Ordered Stop   04/25/18 1250  ceFAZolin (ANCEF) 2-4 GM/100ML-% IVPB    Note to Pharmacy:  Manuela Neptune   : cabinet override      04/25/18 1250 04/26/18 0059   04/25/18 0615  ceFAZolin (ANCEF) IVPB 2g/100 mL premix     2 g 200 mL/hr over 30 Minutes Intravenous To Radiology 04/25/18 0606 04/25/18 1329      Procedures: None  CONSULTS:  general surgery and Palliative care  Time spent: 25 minutes-Greater than 50% of this time was spent in counseling, explanation of diagnosis, planning of further management, and coordination of care.  MEDICATIONS: Scheduled Meds: . dexamethasone  4 mg Intravenous Daily  .  docusate sodium  100 mg Oral Daily  . enoxaparin (LOVENOX) injection  40 mg Subcutaneous Q24H  . feeding supplement  1 Container Oral TID BM  . mouth rinse  15 mL Mouth Rinse BID  . pantoprazole (PROTONIX) IV  40 mg Intravenous  Q12H  . polyethylene glycol  17 g Oral BID  . scopolamine  1 patch Transdermal Q72H  . sodium chloride flush  10-40 mL Intracatheter Q12H  . tamsulosin  0.4 mg Oral Daily   Continuous Infusions: . dextrose 5 % and 0.45 % NaCl with KCl 40 mEq/L 50 mL/hr at 04/28/18 1032   PRN Meds:.acetaminophen **OR** acetaminophen, HYDROmorphone (DILAUDID) injection, ondansetron **OR** ondansetron (ZOFRAN) IV, prochlorperazine, promethazine, sodium chloride flush, zolpidem   PHYSICAL EXAM: Vital signs: Vitals:   04/27/18 0459 04/27/18 1704 04/27/18 2209 04/28/18 0626  BP: 119/81 118/81 118/85 128/90  Pulse: 97 90 93 (!) 106  Resp: 17 20 18 19   Temp: 97.6 F (36.4 C) 98.6 F (37 C) 98.2 F (36.8 C) 98.8 F (37.1 C)  TempSrc: Oral  Oral   SpO2: 97% 97% 95% 96%  Weight:      Height:       Filed Weights   04/20/18 1105  Weight: 70 kg   Body mass index is 21.52 kg/m.   General appearance:Awake, alert, not in any distress.  Tonically ill-appearing-very frail looking. Eyes:no scleral icterus. HEENT: Atraumatic and Normocephalic Neck: supple, no JVD. Resp:Good air entry bilaterally,no rales or rhonchi CVS: S1 S2 regular, no murmurs.  GI: Bowel sounds sluggish, nontender nondistended.  Extremities: B/L Lower Ext shows no edema, both legs are warm to touch Neurology:  Non focal Musculoskeletal:No digital cyanosis Skin:No Rash, warm and dry Wounds:N/A  I have personally reviewed following labs and imaging studies  LABORATORY DATA: CBC: Recent Labs  Lab 04/22/18 0414 04/23/18 0324 04/23/18 2119 04/24/18 0401 04/25/18 0418 04/27/18 0354  WBC 11.5* 11.7*  --  11.4* 13.0* 10.4  NEUTROABS 10.2* 10.4*  --  9.8*  --  10.2*  HGB 7.0* 6.5* 9.3* 9.5* 9.9* 9.4*  HCT 22.2* 21.3* 29.6* 30.4* 31.7* 29.5*  MCV 92.1 95.1  --  87.1 87.3 88.6  PLT 425* 366  --  295 260 270    Basic Metabolic Panel: Recent Labs  Lab 04/24/18 0401 04/25/18 0418 04/26/18 0437 04/27/18 0354 04/28/18 0521    NA 138 141 140 137 135  K 3.6 3.5 3.1* 3.9 3.5  CL 110 112* 110 109 104  CO2 18* 19* 19* 21* 19*  GLUCOSE 78 69* 89 102* 93  BUN 13 14 12 10 8   CREATININE 0.64 0.73 0.70 0.55* 0.54*  CALCIUM 7.6* 7.5* 7.3* 7.3* 7.4*  MG 1.7 1.5* 1.6* 1.7 1.8    GFR: Estimated Creatinine Clearance: 102.1 mL/min (A) (by C-G formula based on SCr of 0.54 mg/dL (L)).  Liver Function Tests: Recent Labs  Lab 04/22/18 0414 04/28/18 0521  AST 11* 18  ALT 10 11  ALKPHOS 207* 215*  BILITOT 1.9* 1.2  PROT 6.1* 5.6*  ALBUMIN 1.8* 1.7*   No results for input(s): LIPASE, AMYLASE in the last 168 hours. No results for input(s): AMMONIA in the last 168 hours.  Coagulation Profile: Recent Labs  Lab 04/25/18 0418  INR 1.58    Cardiac Enzymes: No results for input(s): CKTOTAL, CKMB, CKMBINDEX, TROPONINI in the last 168 hours.  BNP (last 3 results) No results for input(s): PROBNP in the last 8760 hours.  HbA1C: No results for input(s): HGBA1C in the  last 72 hours.  CBG: No results for input(s): GLUCAP in the last 168 hours.  Lipid Profile: No results for input(s): CHOL, HDL, LDLCALC, TRIG, CHOLHDL, LDLDIRECT in the last 72 hours.  Thyroid Function Tests: No results for input(s): TSH, T4TOTAL, FREET4, T3FREE, THYROIDAB in the last 72 hours.  Anemia Panel: No results for input(s): VITAMINB12, FOLATE, FERRITIN, TIBC, IRON, RETICCTPCT in the last 72 hours.  Urine analysis:    Component Value Date/Time   COLORURINE AMBER (A) 03/16/2018 0200   APPEARANCEUR CLEAR 03/16/2018 0200   LABSPEC 1.021 03/16/2018 0200   PHURINE 6.0 03/16/2018 0200   GLUCOSEU NEGATIVE 03/16/2018 0200   HGBUR NEGATIVE 03/16/2018 0200   BILIRUBINUR SMALL (A) 03/16/2018 0200   KETONESUR 20 (A) 03/16/2018 0200   PROTEINUR 100 (A) 03/16/2018 0200   NITRITE NEGATIVE 03/16/2018 0200   LEUKOCYTESUR NEGATIVE 03/16/2018 0200    Sepsis Labs: Lactic Acid, Venous    Component Value Date/Time   LATICACIDVEN 1.9 04/07/2018  0130    MICROBIOLOGY: No results found for this or any previous visit (from the past 240 hour(s)).  RADIOLOGY STUDIES/RESULTS: Ct Angio Chest Pe W Or Wo Contrast  Result Date: 04/22/2018 CLINICAL DATA:  Lung cancer with bony metastasis to the RIGHT shoulder and liver metastasis. EXAM: CT ANGIOGRAPHY CHEST CT ABDOMEN AND PELVIS WITH CONTRAST TECHNIQUE: Multidetector CT imaging of the chest was performed using the standard protocol during bolus administration of intravenous contrast. Multiplanar CT image reconstructions and MIPs were obtained to evaluate the vascular anatomy. Multidetector CT imaging of the abdomen and pelvis was performed using the standard protocol during bolus administration of intravenous contrast. CONTRAST:  129mL ISOVUE-370 IOPAMIDOL (ISOVUE-370) INJECTION 76% COMPARISON:  CT 03/09/2018, 01/28/2018 FINDINGS: CTA CHEST FINDINGS Cardiovascular: There is narrowing of the branches of the LEFT lobe pulmonary arteries by a LEFT hilar tumor as well as narrowing lingular branch however there is no endoluminal filling defect suggest acute pulmonary embolism. No pulmonary embolism within the RIGHT lung either. Port in the anterior chest wall with tip in distal SVC. Mediastinum/Nodes: No axillary or supraclavicular enlarged nodes. No mediastinal lymphadenopathy. Esophagus is patulous scratch the esophagus is fluid-filled unchanged from prior. Lungs/Pleura: LEFT perihilar mass surrounding structures of the LEFT hilum again noted. There is complete obstruction of the LEFT lobe bronchus. There is new/increased LEFT lower lobe atelectasis. There is loculated effusion at the LEFT lung base new from prior. There is nodular thickening of the pleural space. Small RIGHT effusion.  Mild interstitial edema. Musculoskeletal: Destructive lesion involving the RIGHT scapula extending the soft tissue musculature of the RIGHT shoulder again noted. Review of the MIP images confirms the above findings. CT ABDOMEN  and PELVIS FINDINGS Hepatobiliary: Round targetoid lesions within the LEFT and RIGHT hepatic lobe consistent metastasis not changed from prior. Gallbladder is distended to 4.7 cm. Common bile duct normal caliber. Pancreas: Pancreas is normal. No ductal dilatation. No pancreatic inflammation. Spleen: Normal spleen Adrenals/urinary tract: The thickening of the RIGHT adrenal gland to 11 mm consistent with metastasis. No change. Kidneys, ureters bladder normal Stomach/Bowel: Stomach is normal. Fluid within the esophagus noted. There is dilatation of the duodenum and proximal small bowel. Air-fluid levels within the small bowel. The small bowel mild distension (approximately 3.5 cm) is uniform and extends into the distal small bowel with there is caliber change leading up to the terminal ileum. There is no clear transition point identified. No pneumatosis. No portal venous gas. Vascular/Lymphatic: Abdominal aorta normal. Retroperitoneal lymph node LEFT aorta increased in size measuring  12 mm (image 34/12 Reproductive: Prostate normal Other: No free fluid. Musculoskeletal: The sclerotic lesions in the lumbar spine consistent metastasis more prominent than prior (image 80/16 involving the L3 and L4 vertebral bodies. Aggressive RIGHT scapular lesion again noted. Review of the MIP images confirms the above findings. IMPRESSION: Chest Impression: 1. No evidence acute pulmonary embolism. 2. Interval increase in RIGHT hilar malignancy constricting hilar structures including the pulmonary arteries and particularly the LEFT lower lobe bronchus. 3. LEFT lower bronchus is completely constricted with new atelectasis of the LEFT lower lobe. New loculated pleural effusion and nodularity in the LEFT hemithorax concerning for pleural spread of metastasis. 4. Aggressive bulky lytic lesion in the RIGHT scapula extending into periscapular musculature Abdomen / Pelvis Impression: 1. SMALL BOWEL OBSTRUCTION PATTERN: New moderate dilatation  small bowel involving the jejunum and moderate portion of the ileum. A caliber change in the distal ileum. No transition point identified. Differential of ileus versus early small bowel obstruction. Fluid in the duodenum is concerning for SMALL BOWEL OBSTRUCTION. 2. Stable hepatic metastasis. 3. Interval increase in adenopathy in the LEFT periaortic retroperitoneum. 4. Stable RIGHT adrenal metastasis. These results will be called to the ordering clinician or representative by the Radiologist Assistant, and communication documented in the PACS or zVision Dashboard. Electronically Signed   By: Suzy Bouchard M.D.   On: 04/22/2018 14:08   Ct Abdomen Pelvis W Contrast  Result Date: 04/22/2018 CLINICAL DATA:  Lung cancer with bony metastasis to the RIGHT shoulder and liver metastasis. EXAM: CT ANGIOGRAPHY CHEST CT ABDOMEN AND PELVIS WITH CONTRAST TECHNIQUE: Multidetector CT imaging of the chest was performed using the standard protocol during bolus administration of intravenous contrast. Multiplanar CT image reconstructions and MIPs were obtained to evaluate the vascular anatomy. Multidetector CT imaging of the abdomen and pelvis was performed using the standard protocol during bolus administration of intravenous contrast. CONTRAST:  132mL ISOVUE-370 IOPAMIDOL (ISOVUE-370) INJECTION 76% COMPARISON:  CT 03/09/2018, 01/28/2018 FINDINGS: CTA CHEST FINDINGS Cardiovascular: There is narrowing of the branches of the LEFT lobe pulmonary arteries by a LEFT hilar tumor as well as narrowing lingular branch however there is no endoluminal filling defect suggest acute pulmonary embolism. No pulmonary embolism within the RIGHT lung either. Port in the anterior chest wall with tip in distal SVC. Mediastinum/Nodes: No axillary or supraclavicular enlarged nodes. No mediastinal lymphadenopathy. Esophagus is patulous scratch the esophagus is fluid-filled unchanged from prior. Lungs/Pleura: LEFT perihilar mass surrounding structures  of the LEFT hilum again noted. There is complete obstruction of the LEFT lobe bronchus. There is new/increased LEFT lower lobe atelectasis. There is loculated effusion at the LEFT lung base new from prior. There is nodular thickening of the pleural space. Small RIGHT effusion.  Mild interstitial edema. Musculoskeletal: Destructive lesion involving the RIGHT scapula extending the soft tissue musculature of the RIGHT shoulder again noted. Review of the MIP images confirms the above findings. CT ABDOMEN and PELVIS FINDINGS Hepatobiliary: Round targetoid lesions within the LEFT and RIGHT hepatic lobe consistent metastasis not changed from prior. Gallbladder is distended to 4.7 cm. Common bile duct normal caliber. Pancreas: Pancreas is normal. No ductal dilatation. No pancreatic inflammation. Spleen: Normal spleen Adrenals/urinary tract: The thickening of the RIGHT adrenal gland to 11 mm consistent with metastasis. No change. Kidneys, ureters bladder normal Stomach/Bowel: Stomach is normal. Fluid within the esophagus noted. There is dilatation of the duodenum and proximal small bowel. Air-fluid levels within the small bowel. The small bowel mild distension (approximately 3.5 cm) is uniform and extends  into the distal small bowel with there is caliber change leading up to the terminal ileum. There is no clear transition point identified. No pneumatosis. No portal venous gas. Vascular/Lymphatic: Abdominal aorta normal. Retroperitoneal lymph node LEFT aorta increased in size measuring 12 mm (image 34/12 Reproductive: Prostate normal Other: No free fluid. Musculoskeletal: The sclerotic lesions in the lumbar spine consistent metastasis more prominent than prior (image 80/16 involving the L3 and L4 vertebral bodies. Aggressive RIGHT scapular lesion again noted. Review of the MIP images confirms the above findings. IMPRESSION: Chest Impression: 1. No evidence acute pulmonary embolism. 2. Interval increase in RIGHT hilar  malignancy constricting hilar structures including the pulmonary arteries and particularly the LEFT lower lobe bronchus. 3. LEFT lower bronchus is completely constricted with new atelectasis of the LEFT lower lobe. New loculated pleural effusion and nodularity in the LEFT hemithorax concerning for pleural spread of metastasis. 4. Aggressive bulky lytic lesion in the RIGHT scapula extending into periscapular musculature Abdomen / Pelvis Impression: 1. SMALL BOWEL OBSTRUCTION PATTERN: New moderate dilatation small bowel involving the jejunum and moderate portion of the ileum. A caliber change in the distal ileum. No transition point identified. Differential of ileus versus early small bowel obstruction. Fluid in the duodenum is concerning for SMALL BOWEL OBSTRUCTION. 2. Stable hepatic metastasis. 3. Interval increase in adenopathy in the LEFT periaortic retroperitoneum. 4. Stable RIGHT adrenal metastasis. These results will be called to the ordering clinician or representative by the Radiologist Assistant, and communication documented in the PACS or zVision Dashboard. Electronically Signed   By: Suzy Bouchard M.D.   On: 04/22/2018 14:08   Ir Fluoro Rm 30-60 Min  Result Date: 04/25/2018 INDICATION: History metastatic lung cancer, now with chronic enteric obstructive symptoms. Request made for placement of a percutaneous gastrostomy tube for venting purposes. EXAM: IR FLUORO RM 0-60 MIN COMPARISON:  None. MEDICATIONS: Zofran 4 mg IV CONTRAST:  None ANESTHESIA/SEDATION: None FLUOROSCOPY TIME:  None COMPLICATIONS: None immediate. PROCEDURE: Informed written consent was obtained from the patient following explanation of the procedure, risks, benefits and alternatives. Despite obtaining consent from the patient and the patient's wife prior to the procedure, once placed supine on the fluoroscopy table, the patient experienced nausea and several episodes of vomiting and despite being administered Zofran, ultimately  elected not to pursue gastrostomy tube placement. This was discussed at great length with both the patient the patient's wife however ultimately, the patient, who (per his wife) maintains medical decision making capacity, refuses to undergo gastrostomy tube placement at the present time. No procedure attempted. FINDINGS: Patient refusal as detailed above. IMPRESSION: Patient refused percutaneous gastrostomy tube placement. No procedure attempted. Electronically Signed   By: Sandi Mariscal M.D.   On: 04/25/2018 13:52   Dg Abd 2 Views  Result Date: 04/24/2018 CLINICAL DATA:  Nausea, vomiting, small bowel obstruction. EXAM: ABDOMEN - 2 VIEW COMPARISON:  Radiographs of April 21, 2018. CT scan of April 22, 2018. FINDINGS: Mildly dilated small bowel loops are noted concerning for distal small bowel obstruction or ileus. No colonic dilatation is noted. Stool is noted in the rectum. There is no evidence of free air. No radio-opaque calculi or other significant radiographic abnormality is seen. IMPRESSION: Mildly dilated small bowel loops are noted concerning for distal small bowel obstruction or ileus. Electronically Signed   By: Marijo Conception, M.D.   On: 04/24/2018 12:54   Dg Abd 2 Views  Result Date: 04/21/2018 CLINICAL DATA:  Persistent vomiting, weakness, history of coronary artery disease post MI,  metastatic lung cancer, hypertension, stroke EXAM: ABDOMEN - 2 VIEW COMPARISON:  CT abdomen and pelvis 03/09/2018 FINDINGS: Numerous air-filled distended loops of small bowel throughout abdomen. Small amounts of stool within colon and rectum. Paucity of colonic gas. No definite bowel wall thickening or free air. Gaseous distention of stomach. No acute osseous findings. IMPRESSION: Numerous distended loops of small bowel throughout the abdomen with paucity of colonic gas and mild scattered stool in colon, question small-bowel obstruction versus ileus. No evidence of perforation. Electronically Signed   By: Lavonia Dana M.D.   On: 04/21/2018 12:55   Dg Abd Portable 1v  Result Date: 04/28/2018 CLINICAL DATA:  Follow-up ileus EXAM: PORTABLE ABDOMEN - 1 VIEW COMPARISON:  04/26/2018 FINDINGS: Scattered large and small bowel gas is noted. Improvement in the degree of small bowel dilatation is noted consistent with improving ileus. Retained fecal material remains in the left colon. No free air is seen. No free air is seen. No acute bony abnormality is noted. IMPRESSION: Continued improvement in the degree of small bowel dilatation. Electronically Signed   By: Inez Catalina M.D.   On: 04/28/2018 08:37   Dg Abd Portable 1v  Result Date: 04/26/2018 CLINICAL DATA:  Follow up small bowel obstruction EXAM: PORTABLE ABDOMEN - 1 VIEW COMPARISON:  04/25/2018 FINDINGS: Scattered large and small bowel gas is again identified. Considerable retained fecal material is noted within the left colon. Small bowel dilatation is again identified stable from the previous exam. No new focal abnormality is noted. IMPRESSION: Stable small bowel dilatation likely related to underlying ileus. Retained fecal material within the left colon. Electronically Signed   By: Inez Catalina M.D.   On: 04/26/2018 08:38   Dg Abd Portable 1v  Result Date: 04/25/2018 CLINICAL DATA:  Small bowel obstruction. EXAM: PORTABLE ABDOMEN - 1 VIEW COMPARISON:  Radiographs of April 21, 2018. FINDINGS: Small bowel dilatation noted on prior exam is significantly improved, with residual mildly dilated loop seen in the left upper quadrant, most consistent with improving ileus. Phleboliths are noted in the pelvis. No nephrolithiasis is noted. IMPRESSION: Significantly improved small bowel dilatation is noted most consistent with improving ileus. Electronically Signed   By: Marijo Conception, M.D.   On: 04/25/2018 14:51     LOS: 7 days   Oren Binet, MD  [facility]  If 7PM-7AM, please contact night-coverage  Please page via [email or web address]-Password [email or web address]-click on  MD name and type text message  04/28/2018, 10:40 AM

## 2018-04-28 NOTE — Progress Notes (Signed)
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 No nausea, vomiting or abdominal pain. No bloating. Wife not at bedside.   Objective: Vital signs in last 24 hours: Temp:  [98.2 F (36.8 C)-98.8 F (37.1 C)] 98.8 F (37.1 C) (02/06 0626) Pulse Rate:  [90-106] 106 (02/06 0626) Resp:  [18-20] 19 (02/06 0626) BP: (118-128)/(81-90) 128/90 (02/06 0626) SpO2:  [95 %-97 %] 96 % (02/06 0626) Last BM Date: 04/19/18  Intake/Output from previous day: 02/05 0701 - 02/06 0700 In: 210 [I.V.:210] Out: -  Intake/Output this shift: No intake/output data recorded.  PE: Gen: Alert, ill appearing, sleepy Pulm:Rate andeffort normal Abd: Soft, ND,few BS, no TTP, no guarding, no peritonitis Skin: warm and dry  Anti-infectives: Anti-infectives (From admission, onward)   Start     Dose/Rate Route Frequency Ordered Stop   04/25/18 1250  ceFAZolin (ANCEF) 2-4 GM/100ML-% IVPB    Note to Pharmacy:  Manuela Neptune   : cabinet override      04/25/18 1250 04/26/18 0059   04/25/18 0615  ceFAZolin (ANCEF) IVPB 2g/100 mL premix     2 g 200 mL/hr over 30 Minutes Intravenous To Radiology 04/25/18 0606 04/25/18 1329      Lab Results:  Recent Labs    04/27/18 0354  WBC 10.4  HGB 9.4*  HCT 29.5*  PLT 177   BMET Recent Labs    04/27/18 0354  04/28/18 0521  NA 137 135  K 3.9 3.5  CL 109 104  CO2 21* 19*  GLUCOSE 102* 93  BUN 10 8  CREATININE 0.55* 0.54*  CALCIUM 7.3* 7.4*   PT/INR No results for input(s): LABPROT, INR in the last 72 hours. CMP     Component Value Date/Time   NA 135 04/28/2018 0521   K 3.5 04/28/2018 0521   CL 104 04/28/2018 0521   CO2 19 (L) 04/28/2018 0521   GLUCOSE 93 04/28/2018 0521   BUN 8 04/28/2018 0521   CREATININE 0.54 (L) 04/28/2018 0521   CALCIUM 7.4 (L) 04/28/2018 0521   PROT 5.6 (L) 04/28/2018 0521   ALBUMIN 1.7 (L) 04/28/2018 0521   AST 18 04/28/2018 0521   ALT 11 04/28/2018 0521   ALKPHOS 215 (H) 04/28/2018 0521   BILITOT 1.2 04/28/2018 0521   GFRNONAA >60 04/28/2018 0521   GFRAA >60 04/28/2018 0521   Lipase     Component Value Date/Time   LIPASE 25 03/09/2018 0013    Studies/Results: No results found.    Kalman Drape , [facility] Surgery 04/28/2018, 8:09 AM  Pager: [phone] Mon-Wed, Friday 7:00am-4:30pm Thurs 7am-11:30am  Consults: [phone]

## 2018-04-28 NOTE — Care Management Note (Addendum)
Case Management Note  Patient Details  Name: Ian Roberts MRN: [id number] Date of Birth: 07-16-1960  Subjective/Objective:      Presents with dehydration, hypokalemia/ FTT. Found to have SBO vs ileus. Hx of metastatic left lung adenocarcinoma with metastases to the brain, liver and shoulder. Resides with wife, Ian Roberts.            Ian Roberts (Spouse) Ian Roberts (Daughter)      [phone]         AJL:[id number], Ian Roberts   04/28/2018 @ 1515 NCM spoke with pt's wife and daughter regarding d/c plan. Wife stated under no circumstances are they going to give up on pt!" We don't want hospice care. We are going to fight with him. We lost our son 21/2 yrs ago."  NCM made palliative liaison aware ....  Action/Plan: NCM spoke with wife regarding d/c plan. Wife stated plan is to d/c pt  to home and is agreeable to home health services. Pt is without health insurance. Wife states medicaid is pending. NCM made referral with [facility] for home health services (RN), charity case.  Palliative to f/u with pt/wife to further  discuss  GOC...  NCM will continue to monitor for TOC needs.  Expected Discharge Date:                  Expected Discharge Plan:  [place]  In-House Referral:  Clinical Social Work  Discharge planning Services  CM Consult  Post Acute Care Choice:    Choice offered to:  Patient, Spouse(charity case/ Alvis Lemmings)  DME Arranged:    DME Agency:     HH Arranged:  RN [place] Agency:  [facility]  Status of Service:  Completed, signed off  If discussed at [place] of Stay Meetings, dates discussed:    Additional Comments:  Sharin Mons, RN 04/28/2018, 12:04 PM

## 2018-04-28 NOTE — Progress Notes (Signed)
Physical Therapy Treatment Patient Details Name: Ian Roberts MRN: [id number] DOB: 02-14-1961 Today's Date: 04/28/2018    History of Present Illness 58 year old male with history of metastatic left lung adenocarcinoma with metastases to the brain, liver and shoulder presented with dehydration, vomiting, fatigue and poor oral intake.  He was supposed to have PEG tube placement as an outpatient on 04/20/2018 but got admitted because of severe dehydration and hypokalemia.    PT Comments    Patient seen to attempt to progress mobility. Patient requiring max encouragement and cueing to participate with therapies. Patient only agreeable to transfer to/from [facility] due to BM. Patient requiring Mod/Max A +2 for all bed level mobility and transfers. Requires assist for all balance - both seated and standing. Changed recommendation to SNF due to current functional status and required physical assist. Will follow acutely.     Follow Up Recommendations  SNF     Equipment Recommendations  None recommended by PT    Recommendations for Other Services       Precautions / Restrictions Precautions Precautions: Fall Restrictions Weight Bearing Restrictions: No    Mobility  Bed Mobility Overal bed mobility: Needs Assistance Bed Mobility: Supine to Sit;Sit to Supine     Supine to sit: Mod assist;+2 for physical assistance Sit to supine: Mod assist;+2 for physical assistance   General bed mobility comments: Mod A + 2 for LE and trunk management; initially with Min A for upright balance able to progress to supervision  Transfers Overall transfer level: Needs assistance Equipment used: 2 person hand held assist Transfers: Sit to/from [facility] Sit to Stand: Mod assist;Max assist;+2 physical assistance Stand pivot transfers: Mod assist;Max assist;+2 physical assistance       General transfer comment: Mod/Max +2 for all OOB transfers with poor safety awareness, poor balance;  limited weight shifting and LE advancement increasing his fall risk  Ambulation/Gait             General Gait Details: declined   Stairs             Wheelchair Mobility    Modified Rankin (Stroke Patients Only)       Balance Overall balance assessment: Needs assistance Sitting-balance support: Feet supported Sitting balance-Leahy Scale: Fair Sitting balance - Comments: fair once set   Standing balance support: Bilateral upper extremity supported;During functional activity Standing balance-Leahy Scale: Poor Standing balance comment: knee blocking with +2 HHA                            Cognition Arousal/Alertness: Awake/alert Behavior During Therapy: Flat affect Overall Cognitive Status: Impaired/Different from baseline Area of Impairment: Following commands;Problem solving;Safety/judgement                       Following Commands: Follows one step commands with increased time Safety/Judgement: Decreased awareness of safety;Decreased awareness of deficits   Problem Solving: Decreased initiation;Difficulty sequencing;Slow processing;Requires verbal cues;Requires tactile cues General Comments: slow processing requiring consistent cueing      Exercises      General Comments        Pertinent Vitals/Pain Pain Assessment: Faces Faces Pain Scale: Hurts little more Pain Location: generalized with mobility Pain Descriptors / Indicators: Grimacing;Guarding Pain Intervention(s): Limited activity within patient's tolerance;Monitored during session;Repositioned    Home Living                      Prior Function  PT Goals (current goals can now be found in the care plan section) Acute Rehab PT Goals Patient Stated Goal: walk again PT Goal Formulation: With patient Time For Goal Achievement: 05/08/18 Potential to Achieve Goals: Fair Progress towards PT goals: Progressing toward goals    Frequency    Min  3X/week      PT Plan Current plan remains appropriate    Co-evaluation PT/OT/SLP Co-Evaluation/Treatment: Yes Reason for Co-Treatment: Complexity of the patient's impairments (multi-system involvement);For patient/therapist safety;To address functional/ADL transfers;Necessary to address cognition/behavior during functional activity PT goals addressed during session: Mobility/safety with mobility;Balance        AM-PAC PT "6 Clicks" Mobility   Outcome Measure  Help needed turning from your back to your side while in a flat bed without using bedrails?: A Lot Help needed moving from lying on your back to sitting on the side of a flat bed without using bedrails?: A Lot Help needed moving to and from a bed to a chair (including a wheelchair)?: A Lot Help needed standing up from a chair using your arms (e.g., wheelchair or bedside chair)?: A Lot Help needed to walk in hospital room?: A Lot Help needed climbing 3-5 steps with a railing? : Total 6 Click Score: 11    End of Session Equipment Utilized During Treatment: Gait belt Activity Tolerance: Patient tolerated treatment well;Patient limited by fatigue Patient left: in bed;with call bell/phone within reach Nurse Communication: Mobility status PT Visit Diagnosis: Unsteadiness on feet (R26.81);Muscle weakness (generalized) (M62.81);Pain Pain - Right/Left: Right Pain - part of body: Shoulder     Time: 8546-2703 PT Time Calculation (min) (ACUTE ONLY): 40 min  Charges:  $Therapeutic Activity: 8-22 mins                     Lanney Gins, PT, DPT Supplemental Physical Therapist 04/28/18 3:09 PM Pager: [phone] Office: [phone]

## 2018-04-29 LAB — BASIC METABOLIC PANEL
Anion gap: 8 (ref 5–15)
BUN: 9 mg/dL (ref 6–20)
CALCIUM: 7 mg/dL — AB (ref 8.9–10.3)
CO2: 20 mmol/L — ABNORMAL LOW (ref 22–32)
Chloride: 105 mmol/L (ref 98–111)
Creatinine, Ser: 0.6 mg/dL — ABNORMAL LOW (ref 0.61–1.24)
GFR calc Af Amer: >60 mL/min (ref >60)
Glucose, Bld: 91 mg/dL (ref 70–99)
Potassium: 3.6 mmol/L (ref 3.5–5.1)
Sodium: 133 mmol/L — ABNORMAL LOW (ref 135–145)

## 2018-04-29 LAB — CBC
HCT: 31.8 % — ABNORMAL LOW (ref 39.0–52.0)
Hemoglobin: 10.1 g/dL — ABNORMAL LOW (ref 13.0–17.0)
MCH: 28.1 pg (ref 26.0–34.0)
MCHC: 31.8 g/dL (ref 30.0–36.0)
MCV: 88.3 fL (ref 80.0–100.0)
Platelets: 141 10*3/uL — ABNORMAL LOW (ref 150–400)
RBC: 3.6 MIL/uL — ABNORMAL LOW (ref 4.22–5.81)
RDW: 26.1 % — AB (ref 11.5–15.5)
WBC: 8.3 10*3/uL (ref 4.0–10.5)
nRBC: 0 % (ref 0.0–0.2)

## 2018-04-29 LAB — MAGNESIUM: MAGNESIUM: 1.9 mg/dL (ref 1.7–2.4)

## 2018-04-29 MED ORDER — SENNOSIDES-DOCUSATE SODIUM 8.6-50 MG PO TABS
2.0000 | ORAL_TABLET | Freq: Every day | ORAL | Status: DC
  Filled 2018-04-29: qty 2

## 2018-04-29 MED ORDER — ENSURE ENLIVE PO LIQD
237.0000 mL | Freq: Three times a day (TID) | ORAL | Status: DC
  Administered 2018-04-30 – 2018-05-01 (×2): 237 mL via ORAL

## 2018-04-29 NOTE — Progress Notes (Addendum)
PROGRESS NOTE        PATIENT DETAILS Name: Ian Roberts Age: 58 y.o. Sex: male Date of Birth: 01-04-1961 Admit Date: 04/20/2018 Admitting Physician Merton Border, MD NTI:[id number], Samul Dada, MD  Brief Narrative: Patient is a 58 y.o. male with history of widespread metastatic lung adenocarcinoma-presented with several day history of vomiting, fatigue and poor oral intake, was found to have small bowel obstruction and admitted to the hospitalist service.  Subjective: Had BM yesterday-tolerating full liquids.  Denies any abdominal pain.  Has a very flat affect-and still appears very frail/weak and deconditioned.  He takes quite a while to answer questions.  When asked if he wanted a feeding tube-he really did not give me any answer.  Assessment/Plan: Small bowel obstruction: Improved-no vomiting-tolerating full liquids, had a BM yesterday evening.  Abdominal exam is benign.  Agree with general surgery-we will advance to soft diet-and see how he does.   Dehydration: Improved with IV fluid hydration-encourage oral intake-and start decreasing IV fluids.  Continue to follow volume status.    Hypokalemia/hypomagnesemia: Repleted-follow periodically.    Acute urinary retention: Occurred on 2/5 morning-Foley catheter placed-has been started on Flomax-we will attempt a voiding trial today-DC Foley catheter.    Severe failure to thrive syndrome: Secondary to advanced metastatic cancer-very frail-most recent albumin 1.7.  Per spouse-patient has had significant decline in function-and now unable to ambulate independently, has also lost a significant amount of unspecified weight.  He is incredibly weak-frail-and mostly has a very blank stare when you talk to him.  I do not think he is in any shape to tolerate any further chemo therapy.  It seems that he is declining rather rapidly-he is a DNR-but family is hopeful for eventual recovery of function so that he can continue treatment for  his cancer.  Spouse is aware that this may not happen and that if he continues to deteriorate-hospice may be the only option.  Have encouraged him to eat-have asked nursing staff to get him out of bed to chair-PT will continue to follow as well.  When asked specifically this morning-if he change his mind and wanted a feeding tube-he really did not answer.  Since he is just recovering from a bowel obstruction-suspect it is best to hold off for now-we will consult nutrition-and make sure he is on appropriate/maximum nutritional supplements.  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  Suspect prognosis is very poor-family is aware-but are hopeful of some sort of functional recovery so that he can restart treatment for his cancer.  Chronic pain syndrome: Was on IV Dilaudid-as he was unable to take oral narcotics-we will slowly transition him back to oral narcotic regimen.  Continue bowel regimen with MiraLAX and senna.    Severe malnutrition in context of chronic illness, Underweight  DVT Prophylaxis: Prophylactic Lovenox   Code Status: DNR  Family Communication: Spouse at bedside  Disposition Plan: Remain inpatient-SNF or hospice on discharge  Antimicrobial agents: Anti-infectives (From admission, onward)   Start     Dose/Rate Route Frequency Ordered Stop   04/25/18 1250  ceFAZolin (ANCEF) 2-4 GM/100ML-% IVPB    Note to Pharmacy:  Manuela Neptune   :  cabinet override      04/25/18 1250 04/26/18 0059   04/25/18 0615  ceFAZolin (ANCEF) IVPB 2g/100 mL premix     2 g 200 mL/hr over 30 Minutes Intravenous To Radiology 04/25/18 0606 04/25/18 1329      Procedures: None  CONSULTS:  general surgery and Palliative care  Time spent: 25  minutes-Greater than 50% of this time was spent in counseling, explanation of diagnosis, planning of further management, and coordination of care.  MEDICATIONS: Scheduled Meds: . dexamethasone  4 mg Intravenous Daily  . enoxaparin (LOVENOX) injection  40 mg Subcutaneous Q24H  . feeding supplement  1 Container Oral TID BM  . mouth rinse  15 mL Mouth Rinse BID  . pantoprazole (PROTONIX) IV  40 mg Intravenous Q12H  . polyethylene glycol  17 g Oral BID  . scopolamine  1 patch Transdermal Q72H  . senna-docusate  2 tablet Oral Daily  . sodium chloride flush  10-40 mL Intracatheter Q12H  . tamsulosin  0.4 mg Oral Daily   Continuous Infusions: . dextrose 5 % and 0.45 % NaCl with KCl 40 mEq/L 50 mL/hr at 04/29/18 0850   PRN Meds:.acetaminophen **OR** acetaminophen, HYDROmorphone (DILAUDID) injection, ondansetron **OR** ondansetron (ZOFRAN) IV, prochlorperazine, promethazine, sodium chloride flush, zolpidem   PHYSICAL EXAM: Vital signs: Vitals:   04/28/18 0626 04/28/18 1519 04/28/18 1958 04/29/18 0627  BP: 128/90 103/74 116/84 95/80  Pulse: (!) 106 (!) 109 (!) 109 (!) 111  Resp: 19 20 17    Temp: 98.8 F (37.1 C) 99.6 F (37.6 C) (!) 97.5 F (36.4 C) 98.1 F (36.7 C)  TempSrc:  Oral Oral Oral  SpO2: 96% 97% 96% 94%  Weight:      Height:       Filed Weights   04/20/18 1105  Weight: 70 kg   Body mass index is 21.52 kg/m.   General appearance:Awake, alert, very frail and chronically sick appearing. Eyes:no scleral icterus. HEENT: Atraumatic and Normocephalic Neck: supple, no JVD. Resp:Good air entry bilaterally,no added sounds heard anteriorly CVS: S1 S2 regular, no murmurs.  GI: Bowel sounds present, Non tender and not distended with no gaurding, rigidity or rebound. Extremities: B/L Lower Ext shows no edema, both legs are warm to touch Neurology:  Non focal but with significant generalized weakness  Musculoskeletal:No digital cyanosis Skin:No Rash, warm and  dry Wounds:N/A I have personally reviewed following labs and imaging studies  LABORATORY DATA: CBC: Recent Labs  Lab 04/23/18 0324 04/23/18 2119 04/24/18 0401 04/25/18 0418 04/27/18 0354 04/29/18 0342  WBC 11.7*  --  11.4* 13.0* 10.4 8.3  NEUTROABS 10.4*  --  9.8*  --  10.2*  --   HGB 6.5* 9.3* 9.5* 9.9* 9.4* 10.1*  HCT 21.3* 29.6* 30.4* 31.7* 29.5* 31.8*  MCV 95.1  --  87.1 87.3 88.6 88.3  PLT 366  --  295 260 177 141*    Basic Metabolic Panel: Recent Labs  Lab 04/25/18 0418 04/26/18 0437 04/27/18 0354 04/28/18 0521 04/29/18 0342  NA 141 140 137 135 133*  K 3.5 3.1* 3.9 3.5 3.6  CL 112* 110 109 104 105  CO2 19* 19* 21* 19* 20*  GLUCOSE 69* 89 102* 93 91  BUN 14 12 10 8 9   CREATININE 0.73 0.70 0.55* 0.54* 0.60*  CALCIUM 7.5* 7.3* 7.3* 7.4* 7.0*  MG 1.5* 1.6* 1.7 1.8 1.9    GFR: Estimated Creatinine Clearance: 102.1 mL/min (A) (by C-G formula based on SCr of 0.6 mg/dL (L)).  Liver Function Tests: Recent  Labs  Lab 04/28/18 0521  AST 18  ALT 11  ALKPHOS 215*  BILITOT 1.2  PROT 5.6*  ALBUMIN 1.7*   No results for input(s): LIPASE, AMYLASE in the last 168 hours. No results for input(s): AMMONIA in the last 168 hours.  Coagulation Profile: Recent Labs  Lab 04/25/18 0418  INR 1.58    Cardiac Enzymes: No results for input(s): CKTOTAL, CKMB, CKMBINDEX, TROPONINI in the last 168 hours.  BNP (last 3 results) No results for input(s): PROBNP in the last 8760 hours.  HbA1C: No results for input(s): HGBA1C in the last 72 hours.  CBG: No results for input(s): GLUCAP in the last 168 hours.  Lipid Profile: No results for input(s): CHOL, HDL, LDLCALC, TRIG, CHOLHDL, LDLDIRECT in the last 72 hours.  Thyroid Function Tests: No results for input(s): TSH, T4TOTAL, FREET4, T3FREE, THYROIDAB in the last 72 hours.  Anemia Panel: No results for input(s): VITAMINB12, FOLATE, FERRITIN, TIBC, IRON, RETICCTPCT in the last 72 hours.  Urine analysis:     Component Value Date/Time   COLORURINE AMBER (A) 03/16/2018 0200   APPEARANCEUR CLEAR 03/16/2018 0200   LABSPEC 1.021 03/16/2018 0200   PHURINE 6.0 03/16/2018 0200   GLUCOSEU NEGATIVE 03/16/2018 0200   HGBUR NEGATIVE 03/16/2018 0200   BILIRUBINUR SMALL (A) 03/16/2018 0200   KETONESUR 20 (A) 03/16/2018 0200   PROTEINUR 100 (A) 03/16/2018 0200   NITRITE NEGATIVE 03/16/2018 0200   LEUKOCYTESUR NEGATIVE 03/16/2018 0200    Sepsis Labs: Lactic Acid, Venous    Component Value Date/Time   LATICACIDVEN 1.9 04/07/2018 0130    MICROBIOLOGY: No results found for this or any previous visit (from the past 240 hour(s)).  RADIOLOGY STUDIES/RESULTS: Ct Angio Chest Pe W Or Wo Contrast  Result Date: 04/22/2018 CLINICAL DATA:  Lung cancer with bony metastasis to the RIGHT shoulder and liver metastasis. EXAM: CT ANGIOGRAPHY CHEST CT ABDOMEN AND PELVIS WITH CONTRAST TECHNIQUE: Multidetector CT imaging of the chest was performed using the standard protocol during bolus administration of intravenous contrast. Multiplanar CT image reconstructions and MIPs were obtained to evaluate the vascular anatomy. Multidetector CT imaging of the abdomen and pelvis was performed using the standard protocol during bolus administration of intravenous contrast. CONTRAST:  128mL ISOVUE-370 IOPAMIDOL (ISOVUE-370) INJECTION 76% COMPARISON:  CT 03/09/2018, 01/28/2018 FINDINGS: CTA CHEST FINDINGS Cardiovascular: There is narrowing of the branches of the LEFT lobe pulmonary arteries by a LEFT hilar tumor as well as narrowing lingular branch however there is no endoluminal filling defect suggest acute pulmonary embolism. No pulmonary embolism within the RIGHT lung either. Port in the anterior chest wall with tip in distal SVC. Mediastinum/Nodes: No axillary or supraclavicular enlarged nodes. No mediastinal lymphadenopathy. Esophagus is patulous scratch the esophagus is fluid-filled unchanged from prior. Lungs/Pleura: LEFT perihilar  mass surrounding structures of the LEFT hilum again noted. There is complete obstruction of the LEFT lobe bronchus. There is new/increased LEFT lower lobe atelectasis. There is loculated effusion at the LEFT lung base new from prior. There is nodular thickening of the pleural space. Small RIGHT effusion.  Mild interstitial edema. Musculoskeletal: Destructive lesion involving the RIGHT scapula extending the soft tissue musculature of the RIGHT shoulder again noted. Review of the MIP images confirms the above findings. CT ABDOMEN and PELVIS FINDINGS Hepatobiliary: Round targetoid lesions within the LEFT and RIGHT hepatic lobe consistent metastasis not changed from prior. Gallbladder is distended to 4.7 cm. Common bile duct normal caliber. Pancreas: Pancreas is normal. No ductal dilatation. No pancreatic inflammation. Spleen: Normal spleen  Adrenals/urinary tract: The thickening of the RIGHT adrenal gland to 11 mm consistent with metastasis. No change. Kidneys, ureters bladder normal Stomach/Bowel: Stomach is normal. Fluid within the esophagus noted. There is dilatation of the duodenum and proximal small bowel. Air-fluid levels within the small bowel. The small bowel mild distension (approximately 3.5 cm) is uniform and extends into the distal small bowel with there is caliber change leading up to the terminal ileum. There is no clear transition point identified. No pneumatosis. No portal venous gas. Vascular/Lymphatic: Abdominal aorta normal. Retroperitoneal lymph node LEFT aorta increased in size measuring 12 mm (image 34/12 Reproductive: Prostate normal Other: No free fluid. Musculoskeletal: The sclerotic lesions in the lumbar spine consistent metastasis more prominent than prior (image 80/16 involving the L3 and L4 vertebral bodies. Aggressive RIGHT scapular lesion again noted. Review of the MIP images confirms the above findings. IMPRESSION: Chest Impression: 1. No evidence acute pulmonary embolism. 2. Interval  increase in RIGHT hilar malignancy constricting hilar structures including the pulmonary arteries and particularly the LEFT lower lobe bronchus. 3. LEFT lower bronchus is completely constricted with new atelectasis of the LEFT lower lobe. New loculated pleural effusion and nodularity in the LEFT hemithorax concerning for pleural spread of metastasis. 4. Aggressive bulky lytic lesion in the RIGHT scapula extending into periscapular musculature Abdomen / Pelvis Impression: 1. SMALL BOWEL OBSTRUCTION PATTERN: New moderate dilatation small bowel involving the jejunum and moderate portion of the ileum. A caliber change in the distal ileum. No transition point identified. Differential of ileus versus early small bowel obstruction. Fluid in the duodenum is concerning for SMALL BOWEL OBSTRUCTION. 2. Stable hepatic metastasis. 3. Interval increase in adenopathy in the LEFT periaortic retroperitoneum. 4. Stable RIGHT adrenal metastasis. These results will be called to the ordering clinician or representative by the Radiologist Assistant, and communication documented in the PACS or zVision Dashboard. Electronically Signed   By: Suzy Bouchard M.D.   On: 04/22/2018 14:08   Ct Abdomen Pelvis W Contrast  Result Date: 04/22/2018 CLINICAL DATA:  Lung cancer with bony metastasis to the RIGHT shoulder and liver metastasis. EXAM: CT ANGIOGRAPHY CHEST CT ABDOMEN AND PELVIS WITH CONTRAST TECHNIQUE: Multidetector CT imaging of the chest was performed using the standard protocol during bolus administration of intravenous contrast. Multiplanar CT image reconstructions and MIPs were obtained to evaluate the vascular anatomy. Multidetector CT imaging of the abdomen and pelvis was performed using the standard protocol during bolus administration of intravenous contrast. CONTRAST:  131mL ISOVUE-370 IOPAMIDOL (ISOVUE-370) INJECTION 76% COMPARISON:  CT 03/09/2018, 01/28/2018 FINDINGS: CTA CHEST FINDINGS Cardiovascular: There is narrowing  of the branches of the LEFT lobe pulmonary arteries by a LEFT hilar tumor as well as narrowing lingular branch however there is no endoluminal filling defect suggest acute pulmonary embolism. No pulmonary embolism within the RIGHT lung either. Port in the anterior chest wall with tip in distal SVC. Mediastinum/Nodes: No axillary or supraclavicular enlarged nodes. No mediastinal lymphadenopathy. Esophagus is patulous scratch the esophagus is fluid-filled unchanged from prior. Lungs/Pleura: LEFT perihilar mass surrounding structures of the LEFT hilum again noted. There is complete obstruction of the LEFT lobe bronchus. There is new/increased LEFT lower lobe atelectasis. There is loculated effusion at the LEFT lung base new from prior. There is nodular thickening of the pleural space. Small RIGHT effusion.  Mild interstitial edema. Musculoskeletal: Destructive lesion involving the RIGHT scapula extending the soft tissue musculature of the RIGHT shoulder again noted. Review of the MIP images confirms the above findings. CT ABDOMEN and PELVIS  FINDINGS Hepatobiliary: Round targetoid lesions within the LEFT and RIGHT hepatic lobe consistent metastasis not changed from prior. Gallbladder is distended to 4.7 cm. Common bile duct normal caliber. Pancreas: Pancreas is normal. No ductal dilatation. No pancreatic inflammation. Spleen: Normal spleen Adrenals/urinary tract: The thickening of the RIGHT adrenal gland to 11 mm consistent with metastasis. No change. Kidneys, ureters bladder normal Stomach/Bowel: Stomach is normal. Fluid within the esophagus noted. There is dilatation of the duodenum and proximal small bowel. Air-fluid levels within the small bowel. The small bowel mild distension (approximately 3.5 cm) is uniform and extends into the distal small bowel with there is caliber change leading up to the terminal ileum. There is no clear transition point identified. No pneumatosis. No portal venous gas. Vascular/Lymphatic:  Abdominal aorta normal. Retroperitoneal lymph node LEFT aorta increased in size measuring 12 mm (image 34/12 Reproductive: Prostate normal Other: No free fluid. Musculoskeletal: The sclerotic lesions in the lumbar spine consistent metastasis more prominent than prior (image 80/16 involving the L3 and L4 vertebral bodies. Aggressive RIGHT scapular lesion again noted. Review of the MIP images confirms the above findings. IMPRESSION: Chest Impression: 1. No evidence acute pulmonary embolism. 2. Interval increase in RIGHT hilar malignancy constricting hilar structures including the pulmonary arteries and particularly the LEFT lower lobe bronchus. 3. LEFT lower bronchus is completely constricted with new atelectasis of the LEFT lower lobe. New loculated pleural effusion and nodularity in the LEFT hemithorax concerning for pleural spread of metastasis. 4. Aggressive bulky lytic lesion in the RIGHT scapula extending into periscapular musculature Abdomen / Pelvis Impression: 1. SMALL BOWEL OBSTRUCTION PATTERN: New moderate dilatation small bowel involving the jejunum and moderate portion of the ileum. A caliber change in the distal ileum. No transition point identified. Differential of ileus versus early small bowel obstruction. Fluid in the duodenum is concerning for SMALL BOWEL OBSTRUCTION. 2. Stable hepatic metastasis. 3. Interval increase in adenopathy in the LEFT periaortic retroperitoneum. 4. Stable RIGHT adrenal metastasis. These results will be called to the ordering clinician or representative by the Radiologist Assistant, and communication documented in the PACS or zVision Dashboard. Electronically Signed   By: Suzy Bouchard M.D.   On: 04/22/2018 14:08   Ir Fluoro Rm 30-60 Min  Result Date: 04/25/2018 INDICATION: History metastatic lung cancer, now with chronic enteric obstructive symptoms. Request made for placement of a percutaneous gastrostomy tube for venting purposes. EXAM: IR FLUORO RM 0-60 MIN  COMPARISON:  None. MEDICATIONS: Zofran 4 mg IV CONTRAST:  None ANESTHESIA/SEDATION: None FLUOROSCOPY TIME:  None COMPLICATIONS: None immediate. PROCEDURE: Informed written consent was obtained from the patient following explanation of the procedure, risks, benefits and alternatives. Despite obtaining consent from the patient and the patient's wife prior to the procedure, once placed supine on the fluoroscopy table, the patient experienced nausea and several episodes of vomiting and despite being administered Zofran, ultimately elected not to pursue gastrostomy tube placement. This was discussed at great length with both the patient the patient's wife however ultimately, the patient, who (per his wife) maintains medical decision making capacity, refuses to undergo gastrostomy tube placement at the present time. No procedure attempted. FINDINGS: Patient refusal as detailed above. IMPRESSION: Patient refused percutaneous gastrostomy tube placement. No procedure attempted. Electronically Signed   By: Sandi Mariscal M.D.   On: 04/25/2018 13:52   Dg Abd 2 Views  Result Date: 04/24/2018 CLINICAL DATA:  Nausea, vomiting, small bowel obstruction. EXAM: ABDOMEN - 2 VIEW COMPARISON:  Radiographs of April 21, 2018. CT scan of April 22, 2018. FINDINGS: Mildly dilated small bowel loops are noted concerning for distal small bowel obstruction or ileus. No colonic dilatation is noted. Stool is noted in the rectum. There is no evidence of free air. No radio-opaque calculi or other significant radiographic abnormality is seen. IMPRESSION: Mildly dilated small bowel loops are noted concerning for distal small bowel obstruction or ileus. Electronically Signed   By: Marijo Conception, M.D.   On: 04/24/2018 12:54   Dg Abd 2 Views  Result Date: 04/21/2018 CLINICAL DATA:  Persistent vomiting, weakness, history of coronary artery disease post MI, metastatic lung cancer, hypertension, stroke EXAM: ABDOMEN - 2 VIEW COMPARISON:  CT  abdomen and pelvis 03/09/2018 FINDINGS: Numerous air-filled distended loops of small bowel throughout abdomen. Small amounts of stool within colon and rectum. Paucity of colonic gas. No definite bowel wall thickening or free air. Gaseous distention of stomach. No acute osseous findings. IMPRESSION: Numerous distended loops of small bowel throughout the abdomen with paucity of colonic gas and mild scattered stool in colon, question small-bowel obstruction versus ileus. No evidence of perforation. Electronically Signed   By: Lavonia Dana M.D.   On: 04/21/2018 12:55   Dg Abd Portable 1v  Result Date: 04/28/2018 CLINICAL DATA:  Follow-up ileus EXAM: PORTABLE ABDOMEN - 1 VIEW COMPARISON:  04/26/2018 FINDINGS: Scattered large and small bowel gas is noted. Improvement in the degree of small bowel dilatation is noted consistent with improving ileus. Retained fecal material remains in the left colon. No free air is seen. No free air is seen. No acute bony abnormality is noted. IMPRESSION: Continued improvement in the degree of small bowel dilatation. Electronically Signed   By: Inez Catalina M.D.   On: 04/28/2018 08:37   Dg Abd Portable 1v  Result Date: 04/26/2018 CLINICAL DATA:  Follow up small bowel obstruction EXAM: PORTABLE ABDOMEN - 1 VIEW COMPARISON:  04/25/2018 FINDINGS: Scattered large and small bowel gas is again identified. Considerable retained fecal material is noted within the left colon. Small bowel dilatation is again identified stable from the previous exam. No new focal abnormality is noted. IMPRESSION: Stable small bowel dilatation likely related to underlying ileus. Retained fecal material within the left colon. Electronically Signed   By: Inez Catalina M.D.   On: 04/26/2018 08:38   Dg Abd Portable 1v  Result Date: 04/25/2018 CLINICAL DATA:  Small bowel obstruction. EXAM: PORTABLE ABDOMEN - 1 VIEW COMPARISON:  Radiographs of April 21, 2018. FINDINGS: Small bowel dilatation noted on prior exam is  significantly improved, with residual mildly dilated loop seen in the left upper quadrant, most consistent with improving ileus. Phleboliths are noted in the pelvis. No nephrolithiasis is noted. IMPRESSION: Significantly improved small bowel dilatation is noted most consistent with improving ileus. Electronically Signed   By: Marijo Conception, M.D.   On: 04/25/2018 14:51     LOS: 8 days   Oren Binet, MD  [facility]  If 7PM-7AM, please contact night-coverage  Please page via [email or web address]-Password [email or web address]-click on MD name and type text message  04/29/2018, 11:28 AM

## 2018-04-29 NOTE — Clinical Social Work Note (Signed)
Clinical Social Work Assessment  Patient Details  Name: Ian Roberts MRN: [id number] Date of Birth: 07-20-1960  Date of referral:  04/29/18               Reason for consult:  Discharge Planning                Permission sought to share information with:  Case Manager Permission granted to share information::  Yes, Verbal Permission Granted  Name::     Wife and Daughter  Agency::  [facility]  Relationship::     Contact Information:     Housing/Transportation Living arrangements for the past 2 months:  Single Family Home Source of Information:  Spouse Patient Interpreter Needed:  None Criminal Activity/Legal Involvement Pertinent to Current Situation/Hospitalization:  No - Comment as needed Significant Relationships:  Adult Children, Spouse Lives with:  Spouse Do you feel safe going back to the place where you live?  Yes Need for family participation in patient care:  No (Coment)  Care giving concerns:    Patient does not have insurance. Currently medicaid is pending. Patient also is suffering from poor nutritional status.    Social Worker assessment / plan:    CSW met with the patient's wife out in the hallway. Patient's daughter participated in the conversation over the phone. CSW asked if the patient's wife had made a decision, she stated that she is not wanting to take him home. She is not ready to give up on him yet. Patient's family would like to pursue with SNF. CSW explained that only a few facilities will accept what is called an LOG. CSW stated that she would need to clarify with her Surveyor, quantity about which facilities do accept the letter. Patient's wife stated that she would like to have him closer to [facility] if it could be helped. 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 She is not in place where she is able to care for her husband at home.   Family is very resilant in doing what is best for the patient. CSW  called and spoke with Surveyor, quantity, Georga Kaufmann, and he stated that only [facility] [place] are able to accept the LOG. He stated that CSW would need to contact financial counseling to determine if the patient has a disability application pending. CSW would need to locate a bed for the patient and determine if the facility could accept the patient. CSW called financial counseling and left a message with [name] explaining the situation and asked if she could have a call back. Since the family wanted a facility close in [place], [place] reached out to [place] with the [facility]. He stated that the [facility] accepts LOGs's on a case by case basis, but if the CSW could send over the patient's information then he could look at it.   Employment status:  Retired Forensic scientist:  Self Pay (Medicaid Pending) PT Recommendations:  [place] / Referral to community resources:  [place]  Patient/Family's Response to care:  Family is understanding but also is in denial of patients current medical condition.   Patient/Family's Understanding of and Emotional Response to Diagnosis, Current Treatment, and Prognosis:  Family is determined to pursue SNF for the patient at this time.   Emotional Assessment Appearance:  Appears stated age Attitude/Demeanor/Rapport:  Unable to Assess Affect (typically observed):  Unable to Assess Orientation:  Oriented to  Self, Oriented to Situation, Oriented to Place Alcohol / Substance use:  Not Applicable Psych involvement (Current and /or in the community):  No (Comment)  Discharge Needs  Concerns to be addressed:  Discharge Planning Concerns Readmission within the last 30 days:  No Current discharge risk:  Dependent with Mobility Barriers to Discharge:  Continued Medical Work up, Inadequate or no [employer] [facility] Krystena Reitter, [employer] 04/29/2018, 5:15 PM

## 2018-04-29 NOTE — Progress Notes (Signed)
CSW called and spoke with Surveyor, quantity, Georga Kaufmann, and he stated that only [facility] [place] are able to accept the LOG. He stated that CSW would need to contact financial counseling to determine if the patient has a disability application pending. CSW would need to locate a bed for the patient and determine if the facility could accept the patient.   CSW called financial counseling and left a message with [name] explaining the situation and asked if she could have a call back.   Since the family wanted a facility close in [place], [place] reached out to [place] with the [facility]. He stated that the [facility] accepts LOGs's on a case by case basis, but if the CSW could send over the patient's information then he could look at it.  CSW has sent over patient information in the hub, CSW is also awaiting a phone call back from financial counseling.   CSW will continue to follow.   Domenic Schwab, MSW, [place]

## 2018-04-29 NOTE — Plan of Care (Signed)
Discussed with patient in front of family plan of care for the evening, pain management and imprtance of letting us know when he was in pain with some teach back displayed

## 2018-04-29 NOTE — NC FL2 (Signed)
[place] LEVEL OF CARE SCREENING TOOL     IDENTIFICATION  Patient Name: Ian Roberts Birthdate: [birth year] Sex: male Admission Date (Current Location): 04/20/2018  [facility] and [place] Number:  Herbalist and Address:  [facility], [place] [address] [place],  [address]      Provider Number: [id number]  Attending Physician Name and Address:  Jonetta Osgood, MD  Relative Name and Phone Number:       Current Level of Care: Hospital Recommended Level of Care: [place] Prior Approval Number:    Date Approved/Denied:  04/29/2018 PASRR Number:   [id number]  Discharge Plan: SNF    Current Diagnoses: Patient Active Problem List   Diagnosis Date Noted  . Ileus ([place])   . Advanced care planning/counseling discussion   . Metastatic cancer ([place])   . SBO (small bowel obstruction) ([place])   . Protein-calorie malnutrition, severe 04/21/2018  . Pressure injury of skin 04/21/2018  . Palliative care by specialist   . Weakness   . Dehydration   . Abdominal pain   . Hypokalemia 04/20/2018  . Goals of care, counseling/discussion 02/22/2018  . Primary malignant neoplasm of lung metastatic to other site ([place]) 01/31/2018  . DYSLIPIDEMIA 06/05/2008  . HYPERTENSION, BENIGN ESSENTIAL 06/05/2008  . ACUT MI OTH INF WALL SUBSQT EPIS CARE 06/05/2008  . CORONARY ATHEROSCLEROSIS NATIVE CORONARY ARTERY 06/05/2008    Orientation RESPIRATION BLADDER Height & Weight     Self, Situation, Place, Time  Normal Continent Weight: 154 lb 5.2 oz (70 kg) Height:  5\' 11"  (180.3 cm)  BEHAVIORAL SYMPTOMS/MOOD NEUROLOGICAL BOWEL NUTRITION STATUS      Incontinent Diet(Please see DC Summary)  AMBULATORY STATUS COMMUNICATION OF NEEDS Skin   Extensive Assist Verbally PU Stage and Appropriate Care(Stage III on sacrum)                       Personal Care Assistance Level of Assistance  Bathing, Feeding, Dressing Bathing Assistance:  Maximum assistance Feeding assistance: Limited assistance Dressing Assistance: Maximum assistance     Functional Limitations Info  Sight, Hearing, Speech Sight Info: Impaired Hearing Info: Adequate Speech Info: Adequate    SPECIAL CARE FACTORS FREQUENCY  PT (By licensed PT), OT (By licensed OT)     PT Frequency: 3x/week OT Frequency: 2x/week            Contractures Contractures Info: Not present    Additional Factors Info  Code Status, Allergies Code Status Info: DNR Allergies Info: Hydrocodone           Current Medications (04/29/2018):  This is the current hospital active medication list Current Facility-Administered Medications  Medication Dose Route Frequency Provider Last Rate Last Dose  . acetaminophen (TYLENOL) tablet 650 mg  650 mg Oral Q6H PRN Merton Border, MD       Or  . acetaminophen (TYLENOL) suppository 650 mg  650 mg Rectal Q6H PRN Merton Border, MD      . dexamethasone (DECADRON) injection 4 mg  4 mg Intravenous Daily Basilio Cairo, NP   4 mg at 04/29/18 1049  . dextrose 5 % and 0.45 % NaCl with KCl 40 mEq/L infusion   Intravenous Continuous Jonetta Osgood, MD 30 mL/hr at 04/29/18 1250    . enoxaparin (LOVENOX) injection 40 mg  40 mg Subcutaneous Q24H Monia Sabal, PA-C   40 mg at 04/29/18 1049  . feeding supplement (ENSURE ENLIVE) (ENSURE ENLIVE) liquid 237 mL  237 mL Oral TID BM Ghimire, Henreitta Leber, MD      . HYDROmorphone (DILAUDID) injection 0.5 mg  0.5 mg Intravenous Q4H PRN Aline August, MD   0.5 mg at 04/29/18 1249  . MEDLINE mouth rinse  15 mL Mouth Rinse BID Merton Border, MD   15 mL at 04/29/18 1049  . ondansetron (ZOFRAN) injection 4 mg  4 mg Intravenous Q6H PRN Aline August, MD   4 mg at 04/24/18 1323   Or  . ondansetron (ZOFRAN) tablet 4 mg  4 mg Oral Q6H PRN Starla Link, Kshitiz, MD      . pantoprazole (PROTONIX) injection 40 mg  40 mg Intravenous Q12H Basilio Cairo, NP   40 mg at 04/29/18 1049  . polyethylene glycol (MIRALAX / GLYCOLAX)  packet 17 g  17 g Oral BID Jonetta Osgood, MD   17 g at 04/28/18 2102  . prochlorperazine (COMPAZINE) injection 10 mg  10 mg Intravenous Q4H PRN Aline August, MD   10 mg at 04/23/18 0220  . promethazine (PHENERGAN) injection 12.5 mg  12.5 mg Intravenous Q6H PRN Aline August, MD   12.5 mg at 04/29/18 1702  . scopolamine (TRANSDERM-SCOP) 1 MG/3DAYS 1.5 mg  1 patch Transdermal Q72H Basilio Cairo, NP   1.5 mg at 04/28/18 1243  . senna-docusate (Senokot-S) tablet 2 tablet  2 tablet Oral Daily Ghimire, Shanker M, MD      . sodium chloride flush (NS) 0.9 % injection 10-40 mL  10-40 mL Intracatheter Q12H Merton Border, MD   30 mL at 04/28/18 2108  . sodium chloride flush (NS) 0.9 % injection 10-40 mL  10-40 mL Intracatheter PRN Merton Border, MD      . tamsulosin (FLOMAX) capsule 0.4 mg  0.4 mg Oral Daily Jonetta Osgood, MD   0.4 mg at 04/29/18 1049  . zolpidem (AMBIEN) tablet 5 mg  5 mg Oral QHS PRN Kirby-Graham, Karsten Fells, NP         Discharge Medications: Please see discharge summary for a list of discharge medications.  Relevant Imaging Results:  Relevant Lab Results:   Additional Information SSn: [place] Aundrea Higginbotham, LCSWA

## 2018-04-29 NOTE — Progress Notes (Signed)
Nutrition Follow-up  DOCUMENTATION CODES:   Severe malnutrition in context of chronic illness, Underweight  INTERVENTION:   Ensure Enlive po TID, each supplement provides 350 kcal and 20 grams of protein  Magic cup TID with meals, each supplement provides 290 kcal and 9 grams of protein  Feeding assistance and encouragement   NUTRITION DIAGNOSIS:   Severe Malnutrition related to chronic illness(lung cancer w/ mets to brain, right clavicle, and liver) as evidenced by severe fat depletion, severe muscle depletion, moderate fat depletion, moderate muscle depletion, percent weight loss(26.5% weight loss in less than 3 months).  Continues  GOAL:   Patient will meet greater than or equal to 90% of their needs  Not Progressing  MONITOR:   PO intake, Supplement acceptance, Labs, Weight trends, Skin  REASON FOR ASSESSMENT:   Malnutrition Screening Tool    ASSESSMENT:   58 year old male who presented to the ED on 1/29 from short stay where he was scheduled to have a G-tube placed but was found to be extremely weak and hypokalemic. PMH significant for lung cancer with mets to the brain, right clavicle, liver currently undergoing chemo and radiation, CAD, HTN, HLD.  2/05 CL diet 2/06 FL diet 2/07 Soft diet  Palliative care notes reviewed. Noted family interested in nutrition support but pt has refused PEG tube previously. Per RN, pt has also refused temp NG tube  Pt sleeping on visit today. Per RN, pt receiving pain meds regularlu as Pt has been in a lot of pain.   RN reports pt eating minimally. Only taking a bite or two. Per RN, pt has probably sipped on less than 100 mL of fluid today.  Family at bedside and they indicate that pt has been eating bites only for "a while". Recorded po intake 0-15% of meals  Plan to add Ensure Enlive and Murphy Oil but very unlikely that pt will consume these either. Pt has no desire to eat  Labs: sodium 133 Meds: decadron, D5-1/ 2NS with  KCl at 30 ml/hr  Diet Order:   Diet Order            DIET SOFT Room service appropriate? Yes; Fluid consistency: Thin  Diet effective now              EDUCATION NEEDS:   Not appropriate for education at this time  Skin:  Skin Assessment: Skin Integrity Issues: Skin Integrity Issues:: Stage III Stage III: sacrum  Last BM:  2/7  Height:   Ht Readings from Last 1 Encounters:  04/20/18 5\' 11"  (1.803 m)    Weight:   Wt Readings from Last 1 Encounters:  04/20/18 70 kg    Ideal Body Weight:  78.2 kg  BMI:  Body mass index is 21.52 kg/m.  Estimated Nutritional Needs:   Kcal:  2400-2600  Protein:  125-140 grams  Fluid:  >/= 2.4 L   Kerman Passey MS, RD, LDN, CNSC [phone] Pager  [phone] Weekend/On-Call Pager

## 2018-04-29 NOTE — Progress Notes (Signed)
Patient ID: Ian Roberts, male   DOB: Jan 13, 1961, 58 y.o.   MRN: [id number]       Subjective: CC: No complaints He denies any abdominal pain, nausea or vomiting. Wife at bedside. He has been tolerating FLD.   Objective: Vital signs in last 24 hours: Temp:  [97.5 F (36.4 C)-99.6 F (37.6 C)] 98.1 F (36.7 C) (02/07 0627) Pulse Rate:  [109-111] 111 (02/07 0627) Resp:  [17-20] 17 (02/06 1958) BP: (95-116)/(74-84) 95/80 (02/07 0627) SpO2:  [94 %-97 %] 94 % (02/07 0627) Last BM Date: 02/26/19  Intake/Output from previous day: 02/06 0701 - 02/07 0700 In: 1495.1 [P.O.:120; I.V.:1325.1; IV Piggyback:50] Out: 1700 [Urine:1700] Intake/Output this shift: No intake/output data recorded.  PE: Gen: Alert, ill appearing, sleepy Pulm:Rate andeffort normal Abd: Soft, ND,BS present, no TTP, no guarding, no peritonitis Skin: warm and dry  Lab Results:  Recent Labs    04/27/18 0354 04/29/18 0342  WBC 10.4 8.3  HGB 9.4* 10.1*  HCT 29.5* 31.8*  PLT 177 141*   BMET Recent Labs    04/28/18 0521 04/29/18 0342  NA 135 133*  K 3.5 3.6  CL 104 105  CO2 19* 20*  GLUCOSE 93 91  BUN 8 9  CREATININE 0.54* 0.60*  CALCIUM 7.4* 7.0*   PT/INR No results for input(s): LABPROT, INR in the last 72 hours. CMP     Component Value Date/Time   NA 133 (L) 04/29/2018 0342   K 3.6 04/29/2018 0342   CL 105 04/29/2018 0342   CO2 20 (L) 04/29/2018 0342   GLUCOSE 91 04/29/2018 0342   BUN 9 04/29/2018 0342   CREATININE 0.60 (L) 04/29/2018 0342   CALCIUM 7.0 (L) 04/29/2018 0342   PROT 5.6 (L) 04/28/2018 0521   ALBUMIN 1.7 (L) 04/28/2018 0521   AST 18 04/28/2018 0521   ALT 11 04/28/2018 0521   ALKPHOS 215 (H) 04/28/2018 0521   BILITOT 1.2 04/28/2018 0521   GFRNONAA >60 04/29/2018 0342   GFRAA >60 04/29/2018 0342   Lipase     Component Value Date/Time   LIPASE 25 03/09/2018 0013       Studies/Results: Dg Abd Portable 1v  Result Date: 04/28/2018 CLINICAL DATA:  Follow-up  ileus EXAM: PORTABLE ABDOMEN - 1 VIEW COMPARISON:  04/26/2018 FINDINGS: Scattered large and small bowel gas is noted. Improvement in the degree of small bowel dilatation is noted consistent with improving ileus. Retained fecal material remains in the left colon. No free air is seen. No free air is seen. No acute bony abnormality is noted. IMPRESSION: Continued improvement in the degree of small bowel dilatation. Electronically Signed   By: Inez Catalina M.D.   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

## 2018-04-29 NOTE — Plan of Care (Signed)
Discussed with patient and family plan of care for the evening, pain management and medications for the night with some teach back displayed.  Encouraged wife to get the patient to hydrate more.

## 2018-04-30 LAB — BASIC METABOLIC PANEL
Anion gap: 11 (ref 5–15)
BUN: 11 mg/dL (ref 6–20)
CHLORIDE: 104 mmol/L (ref 98–111)
CO2: 19 mmol/L — ABNORMAL LOW (ref 22–32)
Calcium: 7.1 mg/dL — ABNORMAL LOW (ref 8.9–10.3)
Creatinine, Ser: 0.61 mg/dL (ref 0.61–1.24)
GFR calc Af Amer: >60 mL/min (ref >60)
GFR calc non Af Amer: >60 mL/min (ref >60)
Glucose, Bld: 100 mg/dL — ABNORMAL HIGH (ref 70–99)
POTASSIUM: 3.5 mmol/L (ref 3.5–5.1)
Sodium: 134 mmol/L — ABNORMAL LOW (ref 135–145)

## 2018-04-30 LAB — MAGNESIUM: Magnesium: 1.6 mg/dL — ABNORMAL LOW (ref 1.7–2.4)

## 2018-04-30 MED ORDER — HYDROMORPHONE HCL 1 MG/ML IJ SOLN
0.5000 mg | INTRAMUSCULAR | Status: DC | PRN
  Administered 2018-04-30 – 2018-05-01 (×4): 0.5 mg via INTRAVENOUS
  Filled 2018-04-30 (×4): qty 0.5

## 2018-04-30 MED ORDER — MORPHINE SULFATE 15 MG PO TABS
30.0000 mg | ORAL_TABLET | Freq: Four times a day (QID) | ORAL | Status: DC | PRN
  Filled 2018-04-30: qty 2

## 2018-04-30 MED ORDER — MIRTAZAPINE 15 MG PO TABS
15.0000 mg | ORAL_TABLET | Freq: Every day | ORAL | Status: DC
  Filled 2018-04-30: qty 1

## 2018-04-30 MED ORDER — MORPHINE SULFATE 15 MG PO TABS: 30.0000 mg | ORAL_TABLET | Freq: Four times a day (QID) | ORAL | Status: DC | PRN

## 2018-04-30 NOTE — Progress Notes (Signed)
PROGRESS NOTE        PATIENT DETAILS Name: Ian Roberts Age: 58 y.o. Sex: male Date of Birth: 1960/07/10 Admit Date: 04/20/2018 Admitting Physician Merton Border, MD XAJ:OINOMVE, Samul Dada, MD  Brief Narrative: Patient is a 58 y.o. male with history of widespread metastatic lung adenocarcinoma-presented with several day history of vomiting, fatigue and poor oral intake, was found to have small bowel obstruction and admitted to the hospitalist service.  Subjective: Very frail appearing-Per spouse only a few sips at liquids for the past few days-really has not eaten at all.  Somewhat confused this morning-mostly with the blank stare.  Deteriorating-long discussion with patient's spouse-he has severe failure to thrive syndrome-and with such poor oral intake-he may not be able to sustain himself.  Family has decided against PEG tube placement.   Assessment/Plan: Small bowel obstruction: Resolved-having daily BMs.  Normal exam is benign.  Diet has been advanced to a soft diet but with hardly any oral intake over the past few days.  Dehydration: Improved with IV fluids-however at risk of further decompensation/dehydration as patient has hardly any oral intake over the past few days.   Hypokalemia/hypomagnesemia: Repleted-at risk of further electrolyte abnormalities due to poor oral intake.  Acute urinary retention: Occurred on 2/5 morning-requiring Foley catheterization-Foley catheter discontinued on 2/7.  Continue Flomax.   Severe failure to thrive syndrome/palliative care.: Secondary to advanced metastatic cancer-most recent albumin at 1.7.  Significant decline in function and significant loss of weight over the past few months.  He is incredibly frail-and now is mostly bedbound.  Per spouse he is deteriorating rapidly over the past few weeks.  Unfortunately he has had very poor oral intake-only nibbles at food or takes a few sips of liquids.  Extensive discussion with  spouse over the past few days-she understands that he is deteriorating and approaching end-of-life.  Due to severe poor oral intake-he is not able to sustain life.  Suspect life expectancy only a few weeks, if this trend continues.  After extensive discussion-spouse is agreeable for residential hospice.  Social work consulted.  As noted above-family has decided to pursue PEG tube placement, patient had refused it a few days back as well.  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  Chronic pain syndrome: Secondary to widespread malignancy-continue IV Dilaudid and oral narcotics.  Remains on a bowel regimen with senna and MiraLAX.    Severe malnutrition in context of chronic illness, Underweight: In spite of maximal nutritional supplements-is very poor oral intake-deteriorating.  See above  DVT Prophylaxis: Prophylactic Lovenox   Code Status: DNR  Family Communication: Spouse at bedside  Disposition Plan: Remain inpatient-hopefully residential hospice when bed available-social work consulted this morning by RN.  Antimicrobial agents: Anti-infectives (From admission, onward)   Start     Dose/Rate Route Frequency Ordered Stop   04/25/18 1250  ceFAZolin (ANCEF) 2-4 GM/100ML-% IVPB    Note to Pharmacy:  Manuela Neptune   : cabinet override      04/25/18 1250 04/26/18 0059   04/25/18 0615  ceFAZolin (ANCEF) IVPB 2g/100 mL premix     2 g 200 mL/hr over 30 Minutes Intravenous To Radiology 04/25/18 0606 04/25/18 1329      Procedures: None  CONSULTS:  general surgery and Palliative care  Time  spent: 35 minutes-Greater than 50% of this time was spent in counseling, explanation of diagnosis, planning of further management, and coordination of care.  MEDICATIONS: Scheduled Meds: . dexamethasone  4 mg Intravenous Daily  . enoxaparin (LOVENOX) injection  40 mg  Subcutaneous Q24H  . feeding supplement (ENSURE ENLIVE)  237 mL Oral TID BM  . mouth rinse  15 mL Mouth Rinse BID  . mirtazapine  15 mg Oral QHS  . pantoprazole (PROTONIX) IV  40 mg Intravenous Q12H  . polyethylene glycol  17 g Oral BID  . scopolamine  1 patch Transdermal Q72H  . senna-docusate  2 tablet Oral Daily  . sodium chloride flush  10-40 mL Intracatheter Q12H  . tamsulosin  0.4 mg Oral Daily   Continuous Infusions: . dextrose 5 % and 0.45 % NaCl with KCl 40 mEq/L 30 mL/hr at 04/30/18 0539   PRN Meds:.acetaminophen **OR** acetaminophen, HYDROmorphone (DILAUDID) injection, morphine, ondansetron **OR** ondansetron (ZOFRAN) IV, prochlorperazine, promethazine, sodium chloride flush, zolpidem   PHYSICAL EXAM: Vital signs: Vitals:   04/29/18 1706 04/29/18 2124 04/30/18 0524 04/30/18 0541  BP: 100/76 125/75 119/76   Pulse: (!) 120 (!) 101 (!) 115 100  Resp: 20 18 18    Temp: 97.6 F (36.4 C) 98.5 F (36.9 C) 99.1 F (37.3 C)   TempSrc: Oral Oral Oral   SpO2: 96% 96% 99%   Weight:      Height:       Filed Weights   04/20/18 1105  Weight: 70 kg   Body mass index is 21.52 kg/m.   General appearance:Awake, somewhat somnolent-speaks with a very soft voice.  Somewhat confused this morning. Eyes:no scleral icterus. HEENT: Atraumatic and Normocephalic Neck: supple, no JVD. Resp:Good air entry bilaterally, clear to auscultation anteriorly CVS: S1 S2 regular, no murmurs.  GI: Bowel sounds present, Non tender and not distended with no gaurding, rigidity or rebound. Extremities: B/L Lower Ext shows no edema, both legs are warm to touch Neurology:  Non focal-but with severe generalized weakness. Musculoskeletal:No digital cyanosis Skin:No Rash, warm and dry Wounds:N/A  I have personally reviewed following labs and imaging studies  LABORATORY DATA: CBC: Recent Labs  Lab 04/23/18 2119 04/24/18 0401 04/25/18 0418 04/27/18 0354 04/29/18 0342  WBC  --  11.4* 13.0* 10.4  8.3  NEUTROABS  --  9.8*  --  10.2*  --   HGB 9.3* 9.5* 9.9* 9.4* 10.1*  HCT 29.6* 30.4* 31.7* 29.5* 31.8*  MCV  --  87.1 87.3 88.6 88.3  PLT  --  295 260 177 141*    Basic Metabolic Panel: Recent Labs  Lab 04/26/18 0437 04/27/18 0354 04/28/18 0521 04/29/18 0342 04/30/18 0304  NA 140 137 135 133* 134*  K 3.1* 3.9 3.5 3.6 3.5  CL 110 109 104 105 104  CO2 19* 21* 19* 20* 19*  GLUCOSE 89 102* 93 91 100*  BUN 12 10 8 9 11   CREATININE 0.70 0.55* 0.54* 0.60* 0.61  CALCIUM 7.3* 7.3* 7.4* 7.0* 7.1*  MG 1.6* 1.7 1.8 1.9 1.6*    GFR: Estimated Creatinine Clearance: 102.1 mL/min (by C-G formula based on SCr of 0.61 mg/dL).  Liver Function Tests: Recent Labs  Lab 04/28/18 0521  AST 18  ALT 11  ALKPHOS 215*  BILITOT 1.2  PROT 5.6*  ALBUMIN 1.7*   No results for input(s): LIPASE, AMYLASE in the last 168 hours. No results for input(s): AMMONIA in the last 168 hours.  Coagulation Profile: Recent Labs  Lab 04/25/18 0418  INR  1.58    Cardiac Enzymes: No results for input(s): CKTOTAL, CKMB, CKMBINDEX, TROPONINI in the last 168 hours.  BNP (last 3 results) No results for input(s): PROBNP in the last 8760 hours.  HbA1C: No results for input(s): HGBA1C in the last 72 hours.  CBG: No results for input(s): GLUCAP in the last 168 hours.  Lipid Profile: No results for input(s): CHOL, HDL, LDLCALC, TRIG, CHOLHDL, LDLDIRECT in the last 72 hours.  Thyroid Function Tests: No results for input(s): TSH, T4TOTAL, FREET4, T3FREE, THYROIDAB in the last 72 hours.  Anemia Panel: No results for input(s): VITAMINB12, FOLATE, FERRITIN, TIBC, IRON, RETICCTPCT in the last 72 hours.  Urine analysis:    Component Value Date/Time   COLORURINE AMBER (A) 03/16/2018 0200   APPEARANCEUR CLEAR 03/16/2018 0200   LABSPEC 1.021 03/16/2018 0200   PHURINE 6.0 03/16/2018 0200   GLUCOSEU NEGATIVE 03/16/2018 0200   HGBUR NEGATIVE 03/16/2018 0200   BILIRUBINUR SMALL (A) 03/16/2018 0200    KETONESUR 20 (A) 03/16/2018 0200   PROTEINUR 100 (A) 03/16/2018 0200   NITRITE NEGATIVE 03/16/2018 0200   LEUKOCYTESUR NEGATIVE 03/16/2018 0200    Sepsis Labs: Lactic Acid, Venous    Component Value Date/Time   LATICACIDVEN 1.9 04/07/2018 0130    MICROBIOLOGY: No results found for this or any previous visit (from the past 240 hour(s)).  RADIOLOGY STUDIES/RESULTS: Ct Angio Chest Pe W Or Wo Contrast  Result Date: 04/22/2018 CLINICAL DATA:  Lung cancer with bony metastasis to the RIGHT shoulder and liver metastasis. EXAM: CT ANGIOGRAPHY CHEST CT ABDOMEN AND PELVIS WITH CONTRAST TECHNIQUE: Multidetector CT imaging of the chest was performed using the standard protocol during bolus administration of intravenous contrast. Multiplanar CT image reconstructions and MIPs were obtained to evaluate the vascular anatomy. Multidetector CT imaging of the abdomen and pelvis was performed using the standard protocol during bolus administration of intravenous contrast. CONTRAST:  151mL ISOVUE-370 IOPAMIDOL (ISOVUE-370) INJECTION 76% COMPARISON:  CT 03/09/2018, 01/28/2018 FINDINGS: CTA CHEST FINDINGS Cardiovascular: There is narrowing of the branches of the LEFT lobe pulmonary arteries by a LEFT hilar tumor as well as narrowing lingular branch however there is no endoluminal filling defect suggest acute pulmonary embolism. No pulmonary embolism within the RIGHT lung either. Port in the anterior chest wall with tip in distal SVC. Mediastinum/Nodes: No axillary or supraclavicular enlarged nodes. No mediastinal lymphadenopathy. Esophagus is patulous scratch the esophagus is fluid-filled unchanged from prior. Lungs/Pleura: LEFT perihilar mass surrounding structures of the LEFT hilum again noted. There is complete obstruction of the LEFT lobe bronchus. There is new/increased LEFT lower lobe atelectasis. There is loculated effusion at the LEFT lung base new from prior. There is nodular thickening of the pleural space.  Small RIGHT effusion.  Mild interstitial edema. Musculoskeletal: Destructive lesion involving the RIGHT scapula extending the soft tissue musculature of the RIGHT shoulder again noted. Review of the MIP images confirms the above findings. CT ABDOMEN and PELVIS FINDINGS Hepatobiliary: Round targetoid lesions within the LEFT and RIGHT hepatic lobe consistent metastasis not changed from prior. Gallbladder is distended to 4.7 cm. Common bile duct normal caliber. Pancreas: Pancreas is normal. No ductal dilatation. No pancreatic inflammation. Spleen: Normal spleen Adrenals/urinary tract: The thickening of the RIGHT adrenal gland to 11 mm consistent with metastasis. No change. Kidneys, ureters bladder normal Stomach/Bowel: Stomach is normal. Fluid within the esophagus noted. There is dilatation of the duodenum and proximal small bowel. Air-fluid levels within the small bowel. The small bowel mild distension (approximately 3.5 cm) is uniform and  extends into the distal small bowel with there is caliber change leading up to the terminal ileum. There is no clear transition point identified. No pneumatosis. No portal venous gas. Vascular/Lymphatic: Abdominal aorta normal. Retroperitoneal lymph node LEFT aorta increased in size measuring 12 mm (image 34/12 Reproductive: Prostate normal Other: No free fluid. Musculoskeletal: The sclerotic lesions in the lumbar spine consistent metastasis more prominent than prior (image 80/16 involving the L3 and L4 vertebral bodies. Aggressive RIGHT scapular lesion again noted. Review of the MIP images confirms the above findings. IMPRESSION: Chest Impression: 1. No evidence acute pulmonary embolism. 2. Interval increase in RIGHT hilar malignancy constricting hilar structures including the pulmonary arteries and particularly the LEFT lower lobe bronchus. 3. LEFT lower bronchus is completely constricted with new atelectasis of the LEFT lower lobe. New loculated pleural effusion and nodularity in  the LEFT hemithorax concerning for pleural spread of metastasis. 4. Aggressive bulky lytic lesion in the RIGHT scapula extending into periscapular musculature Abdomen / Pelvis Impression: 1. SMALL BOWEL OBSTRUCTION PATTERN: New moderate dilatation small bowel involving the jejunum and moderate portion of the ileum. A caliber change in the distal ileum. No transition point identified. Differential of ileus versus early small bowel obstruction. Fluid in the duodenum is concerning for SMALL BOWEL OBSTRUCTION. 2. Stable hepatic metastasis. 3. Interval increase in adenopathy in the LEFT periaortic retroperitoneum. 4. Stable RIGHT adrenal metastasis. These results will be called to the ordering clinician or representative by the Radiologist Assistant, and communication documented in the PACS or zVision Dashboard. Electronically Signed   By: Suzy Bouchard M.D.   On: 04/22/2018 14:08   Ct Abdomen Pelvis W Contrast  Result Date: 04/22/2018 CLINICAL DATA:  Lung cancer with bony metastasis to the RIGHT shoulder and liver metastasis. EXAM: CT ANGIOGRAPHY CHEST CT ABDOMEN AND PELVIS WITH CONTRAST TECHNIQUE: Multidetector CT imaging of the chest was performed using the standard protocol during bolus administration of intravenous contrast. Multiplanar CT image reconstructions and MIPs were obtained to evaluate the vascular anatomy. Multidetector CT imaging of the abdomen and pelvis was performed using the standard protocol during bolus administration of intravenous contrast. CONTRAST:  155mL ISOVUE-370 IOPAMIDOL (ISOVUE-370) INJECTION 76% COMPARISON:  CT 03/09/2018, 01/28/2018 FINDINGS: CTA CHEST FINDINGS Cardiovascular: There is narrowing of the branches of the LEFT lobe pulmonary arteries by a LEFT hilar tumor as well as narrowing lingular branch however there is no endoluminal filling defect suggest acute pulmonary embolism. No pulmonary embolism within the RIGHT lung either. Port in the anterior chest wall with tip in  distal SVC. Mediastinum/Nodes: No axillary or supraclavicular enlarged nodes. No mediastinal lymphadenopathy. Esophagus is patulous scratch the esophagus is fluid-filled unchanged from prior. Lungs/Pleura: LEFT perihilar mass surrounding structures of the LEFT hilum again noted. There is complete obstruction of the LEFT lobe bronchus. There is new/increased LEFT lower lobe atelectasis. There is loculated effusion at the LEFT lung base new from prior. There is nodular thickening of the pleural space. Small RIGHT effusion.  Mild interstitial edema. Musculoskeletal: Destructive lesion involving the RIGHT scapula extending the soft tissue musculature of the RIGHT shoulder again noted. Review of the MIP images confirms the above findings. CT ABDOMEN and PELVIS FINDINGS Hepatobiliary: Round targetoid lesions within the LEFT and RIGHT hepatic lobe consistent metastasis not changed from prior. Gallbladder is distended to 4.7 cm. Common bile duct normal caliber. Pancreas: Pancreas is normal. No ductal dilatation. No pancreatic inflammation. Spleen: Normal spleen Adrenals/urinary tract: The thickening of the RIGHT adrenal gland to 11 mm consistent with metastasis.  No change. Kidneys, ureters bladder normal Stomach/Bowel: Stomach is normal. Fluid within the esophagus noted. There is dilatation of the duodenum and proximal small bowel. Air-fluid levels within the small bowel. The small bowel mild distension (approximately 3.5 cm) is uniform and extends into the distal small bowel with there is caliber change leading up to the terminal ileum. There is no clear transition point identified. No pneumatosis. No portal venous gas. Vascular/Lymphatic: Abdominal aorta normal. Retroperitoneal lymph node LEFT aorta increased in size measuring 12 mm (image 34/12 Reproductive: Prostate normal Other: No free fluid. Musculoskeletal: The sclerotic lesions in the lumbar spine consistent metastasis more prominent than prior (image 80/16  involving the L3 and L4 vertebral bodies. Aggressive RIGHT scapular lesion again noted. Review of the MIP images confirms the above findings. IMPRESSION: Chest Impression: 1. No evidence acute pulmonary embolism. 2. Interval increase in RIGHT hilar malignancy constricting hilar structures including the pulmonary arteries and particularly the LEFT lower lobe bronchus. 3. LEFT lower bronchus is completely constricted with new atelectasis of the LEFT lower lobe. New loculated pleural effusion and nodularity in the LEFT hemithorax concerning for pleural spread of metastasis. 4. Aggressive bulky lytic lesion in the RIGHT scapula extending into periscapular musculature Abdomen / Pelvis Impression: 1. SMALL BOWEL OBSTRUCTION PATTERN: New moderate dilatation small bowel involving the jejunum and moderate portion of the ileum. A caliber change in the distal ileum. No transition point identified. Differential of ileus versus early small bowel obstruction. Fluid in the duodenum is concerning for SMALL BOWEL OBSTRUCTION. 2. Stable hepatic metastasis. 3. Interval increase in adenopathy in the LEFT periaortic retroperitoneum. 4. Stable RIGHT adrenal metastasis. These results will be called to the ordering clinician or representative by the Radiologist Assistant, and communication documented in the PACS or zVision Dashboard. Electronically Signed   By: Suzy Bouchard M.D.   On: 04/22/2018 14:08   Ir Fluoro Rm 30-60 Min  Result Date: 04/25/2018 INDICATION: History metastatic lung cancer, now with chronic enteric obstructive symptoms. Request made for placement of a percutaneous gastrostomy tube for venting purposes. EXAM: IR FLUORO RM 0-60 MIN COMPARISON:  None. MEDICATIONS: Zofran 4 mg IV CONTRAST:  None ANESTHESIA/SEDATION: None FLUOROSCOPY TIME:  None COMPLICATIONS: None immediate. PROCEDURE: Informed written consent was obtained from the patient following explanation of the procedure, risks, benefits and alternatives.  Despite obtaining consent from the patient and the patient's wife prior to the procedure, once placed supine on the fluoroscopy table, the patient experienced nausea and several episodes of vomiting and despite being administered Zofran, ultimately elected not to pursue gastrostomy tube placement. This was discussed at great length with both the patient the patient's wife however ultimately, the patient, who (per his wife) maintains medical decision making capacity, refuses to undergo gastrostomy tube placement at the present time. No procedure attempted. FINDINGS: Patient refusal as detailed above. IMPRESSION: Patient refused percutaneous gastrostomy tube placement. No procedure attempted. Electronically Signed   By: Sandi Mariscal M.D.   On: 04/25/2018 13:52   Dg Abd 2 Views  Result Date: 04/24/2018 CLINICAL DATA:  Nausea, vomiting, small bowel obstruction. EXAM: ABDOMEN - 2 VIEW COMPARISON:  Radiographs of April 21, 2018. CT scan of April 22, 2018. FINDINGS: Mildly dilated small bowel loops are noted concerning for distal small bowel obstruction or ileus. No colonic dilatation is noted. Stool is noted in the rectum. There is no evidence of free air. No radio-opaque calculi or other significant radiographic abnormality is seen. IMPRESSION: Mildly dilated small bowel loops are noted concerning for distal  small bowel obstruction or ileus. Electronically Signed   By: Marijo Conception, M.D.   On: 04/24/2018 12:54   Dg Abd 2 Views  Result Date: 04/21/2018 CLINICAL DATA:  Persistent vomiting, weakness, history of coronary artery disease post MI, metastatic lung cancer, hypertension, stroke EXAM: ABDOMEN - 2 VIEW COMPARISON:  CT abdomen and pelvis 03/09/2018 FINDINGS: Numerous air-filled distended loops of small bowel throughout abdomen. Small amounts of stool within colon and rectum. Paucity of colonic gas. No definite bowel wall thickening or free air. Gaseous distention of stomach. No acute osseous findings.  IMPRESSION: Numerous distended loops of small bowel throughout the abdomen with paucity of colonic gas and mild scattered stool in colon, question small-bowel obstruction versus ileus. No evidence of perforation. Electronically Signed   By: Lavonia Dana M.D.   On: 04/21/2018 12:55   Dg Abd Portable 1v  Result Date: 04/28/2018 CLINICAL DATA:  Follow-up ileus EXAM: PORTABLE ABDOMEN - 1 VIEW COMPARISON:  04/26/2018 FINDINGS: Scattered large and small bowel gas is noted. Improvement in the degree of small bowel dilatation is noted consistent with improving ileus. Retained fecal material remains in the left colon. No free air is seen. No free air is seen. No acute bony abnormality is noted. IMPRESSION: Continued improvement in the degree of small bowel dilatation. Electronically Signed   By: Inez Catalina M.D.   On: 04/28/2018 08:37   Dg Abd Portable 1v  Result Date: 04/26/2018 CLINICAL DATA:  Follow up small bowel obstruction EXAM: PORTABLE ABDOMEN - 1 VIEW COMPARISON:  04/25/2018 FINDINGS: Scattered large and small bowel gas is again identified. Considerable retained fecal material is noted within the left colon. Small bowel dilatation is again identified stable from the previous exam. No new focal abnormality is noted. IMPRESSION: Stable small bowel dilatation likely related to underlying ileus. Retained fecal material within the left colon. Electronically Signed   By: Inez Catalina M.D.   On: 04/26/2018 08:38   Dg Abd Portable 1v  Result Date: 04/25/2018 CLINICAL DATA:  Small bowel obstruction. EXAM: PORTABLE ABDOMEN - 1 VIEW COMPARISON:  Radiographs of April 21, 2018. FINDINGS: Small bowel dilatation noted on prior exam is significantly improved, with residual mildly dilated loop seen in the left upper quadrant, most consistent with improving ileus. Phleboliths are noted in the pelvis. No nephrolithiasis is noted. IMPRESSION: Significantly improved small bowel dilatation is noted most consistent with  improving ileus. Electronically Signed   By: Marijo Conception, M.D.   On: 04/25/2018 14:51     LOS: 9 days   Oren Binet, MD  [facility]  If 7PM-7AM, please contact night-coverage  Please page via [email or web address]-Password [email or web address]-click on MD name and type text message  04/30/2018, 10:35 AM

## 2018-04-30 NOTE — Progress Notes (Signed)
Spoke with Baxter Flattery from Social Work to let her know family is requesting residential hospice.

## 2018-05-01 MED ORDER — HYDROMORPHONE HCL 1 MG/ML IJ SOLN: 1.0000 mg | INTRAMUSCULAR | 0 refills | Status: AC | PRN

## 2018-05-01 MED ORDER — HEPARIN SOD (PORK) LOCK FLUSH 100 UNIT/ML IV SOLN
500.0000 [IU] | INTRAVENOUS | Status: AC | PRN
  Administered 2018-05-01: 500 [IU]

## 2018-05-01 MED ORDER — TAMSULOSIN HCL 0.4 MG PO CAPS: 0.4000 mg | ORAL_CAPSULE | Freq: Every day | ORAL | Status: AC

## 2018-05-01 NOTE — Progress Notes (Signed)
Patient will Discharge To: [facility] Anticipated DC Date:05/01/2018 Family Notified:yes, spouse [phone] Transport By: Corey Harold   Per MD patient ready for DC to [facility]. RN, patient, patient's family, and facility notified of DC. Assessment, Fl2/Pasrr, and Discharge Summary sent to facility. RN given number for report ([phone]). DC packet on chart. Ambulance transport requested for patient.   CSW signing off.  Reed Breech LCSWA [phone]

## 2018-05-01 NOTE — Discharge Summary (Signed)
PATIENT DETAILS Name: Ian Roberts Age: 58 y.o. Sex: male Date of Birth: 08/27/1960 MRN: [id number]. Admitting Physician: Merton Border, MD [id number], Samul Dada, MD  Admit Date: 04/20/2018 Discharge date: 05/01/2018  Recommendations for Outpatient Follow-up:  1. Optimize comfort measures  Admitted From:  Home  Disposition: [place]: No  Equipment/Devices: None  Discharge Condition: guarded/hospice  CODE STATUS:  DNR  Diet recommendation:  Regular  Brief Summary: See H&P, Labs, Consult and Test reports for all details in brief,Patient is a 58 y.o. male with history of widespread metastatic lung adenocarcinoma-presented with several day history of vomiting, fatigue and poor oral intake, was found to have small bowel obstruction and admitted to the hospitalist service.  Unfortunately the hospital course was complicated by continued deterioration with severe failure to thrive syndrome.  After extensive discussion with family-plans are to discharge to residential hospice.  See below for further details  Brief Hospital Course: Small bowel obstruction: Resolved-having daily BMs.  Normal exam is benign.  Diet has been advanced to a soft diet but with hardly any oral intake over the past few days.  Dehydration: Improved with IV fluids-however at risk of further decompensation/dehydration as patient has hardly any oral intake over the past few days.   Hypokalemia/hypomagnesemia: Repleted-at risk of further electrolyte abnormalities due to poor oral intake.  Acute urinary retention: Occurred on 2/5 morning-requiring Foley catheterization-Foley catheter discontinued on 2/7.  Continue Flomax.   Severe failure to thrive syndrome/palliative care.: Secondary to advanced metastatic cancer-most recent albumin at 1.7.  Significant decline in function and significant loss of weight over the past few months.  He is incredibly frail-and now is mostly bedbound.   Per spouse he is deteriorating rapidly over the past few weeks.  Unfortunately he has had very poor oral intake-only nibbles at food or takes a few sips of liquids.  Extensive discussion with spouse over the past few days-she understands that he is deteriorating and approaching end-of-life.  Due to severe poor oral intake-he is not able to sustain life.  Suspect life expectancy only a few weeks, if this trend continues.  After extensive discussion-spouse is agreeable for residential hospice. As noted above-family has decided to pursue PEG tube placement, patient had refused it a few days back as well.  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  Chronic pain syndrome: Secondary to widespread malignancy-continue IV Dilaudid and oral narcotics.    Severe malnutrition in context of chronic illness, Underweight: In spite of maximal nutritional supplements-is very poor oral intake-deteriorating.  See above  Procedures/Studies: None  Discharge Diagnoses:  Active Problems:   Primary malignant neoplasm of lung metastatic to other site (HCC)   Hypokalemia   Protein-calorie malnutrition, severe   Pressure injury of skin   Palliative care by specialist   Weakness   Dehydration   Abdominal pain   Advanced care planning/counseling discussion   Metastatic cancer ([place])   SBO (small bowel obstruction) ([place])   Ileus ([place])   Discharge Instructions:  Activity:  As tolerated   Discharge Instructions    Diet general   Complete by:  As directed    Increase activity slowly   Complete by:  As directed      Allergies as of 05/01/2018      Reactions   Hydrocodone Hives      Medication List    STOP taking these medications  acetaminophen 500 MG tablet Commonly known as:  TYLENOL   CARBOPLATIN IV   famotidine 20 MG tablet Commonly known as:  PEPCID   folic acid 1 MG  tablet Commonly known as:  FOLVITE   lidocaine-prilocaine cream Commonly known as:  EMLA   ondansetron 4 MG disintegrating tablet Commonly known as:  ZOFRAN ODT   PEMBROLIZUMAB IV     TAKE these medications   dexamethasone 4 MG tablet Commonly known as:  DECADRON Take 1 tablet (4 mg total) by mouth daily.   HYDROmorphone 1 MG/ML injection Commonly known as:  DILAUDID Inject 1 mL (1 mg total) into the vein every 2 (two) hours as needed for severe pain.   morphine 30 MG tablet Commonly known as:  MSIR Take 1 tablet (30 mg total) by mouth every 6 (six) hours as needed for severe pain.   scopolamine 1 MG/3DAYS Commonly known as:  TRANSDERM-SCOP Place 1 patch (1.5 mg total) onto the skin every 3 (three) days.   tamsulosin 0.4 MG Caps capsule Commonly known as:  FLOMAX Take 1 capsule (0.4 mg total) by mouth daily.      Follow-up Information    Neale Burly, MD Follow up.   Specialty:  Internal Medicine Why:  as needed Contact information: [place] [id number] [phone]          Allergies  Allergen Reactions  . Hydrocodone Hives    Consultations:   general surgery and Palliative care   Other Procedures/Studies: Ct Angio Chest Pe W Or Wo Contrast  Result Date: 04/22/2018 CLINICAL DATA:  Lung cancer with bony metastasis to the RIGHT shoulder and liver metastasis. EXAM: CT ANGIOGRAPHY CHEST CT ABDOMEN AND PELVIS WITH CONTRAST TECHNIQUE: Multidetector CT imaging of the chest was performed using the standard protocol during bolus administration of intravenous contrast. Multiplanar CT image reconstructions and MIPs were obtained to evaluate the vascular anatomy. Multidetector CT imaging of the abdomen and pelvis was performed using the standard protocol during bolus administration of intravenous contrast. CONTRAST:  176mL ISOVUE-370 IOPAMIDOL (ISOVUE-370) INJECTION 76% COMPARISON:  CT 03/09/2018, 01/28/2018 FINDINGS: CTA CHEST FINDINGS  Cardiovascular: There is narrowing of the branches of the LEFT lobe pulmonary arteries by a LEFT hilar tumor as well as narrowing lingular branch however there is no endoluminal filling defect suggest acute pulmonary embolism. No pulmonary embolism within the RIGHT lung either. Port in the anterior chest wall with tip in distal SVC. Mediastinum/Nodes: No axillary or supraclavicular enlarged nodes. No mediastinal lymphadenopathy. Esophagus is patulous scratch the esophagus is fluid-filled unchanged from prior. Lungs/Pleura: LEFT perihilar mass surrounding structures of the LEFT hilum again noted. There is complete obstruction of the LEFT lobe bronchus. There is new/increased LEFT lower lobe atelectasis. There is loculated effusion at the LEFT lung base new from prior. There is nodular thickening of the pleural space. Small RIGHT effusion.  Mild interstitial edema. Musculoskeletal: Destructive lesion involving the RIGHT scapula extending the soft tissue musculature of the RIGHT shoulder again noted. Review of the MIP images confirms the above findings. CT ABDOMEN and PELVIS FINDINGS Hepatobiliary: Round targetoid lesions within the LEFT and RIGHT hepatic lobe consistent metastasis not changed from prior. Gallbladder is distended to 4.7 cm. Common bile duct normal caliber. Pancreas: Pancreas is normal. No ductal dilatation. No pancreatic inflammation. Spleen: Normal spleen Adrenals/urinary tract: The thickening of the RIGHT adrenal gland to 11 mm consistent with metastasis. No change. Kidneys, ureters bladder normal Stomach/Bowel: Stomach is normal. Fluid within the esophagus noted.  There is dilatation of the duodenum and proximal small bowel. Air-fluid levels within the small bowel. The small bowel mild distension (approximately 3.5 cm) is uniform and extends into the distal small bowel with there is caliber change leading up to the terminal ileum. There is no clear transition point identified. No pneumatosis. No  portal venous gas. Vascular/Lymphatic: Abdominal aorta normal. Retroperitoneal lymph node LEFT aorta increased in size measuring 12 mm (image 34/12 Reproductive: Prostate normal Other: No free fluid. Musculoskeletal: The sclerotic lesions in the lumbar spine consistent metastasis more prominent than prior (image 80/16 involving the L3 and L4 vertebral bodies. Aggressive RIGHT scapular lesion again noted. Review of the MIP images confirms the above findings. IMPRESSION: Chest Impression: 1. No evidence acute pulmonary embolism. 2. Interval increase in RIGHT hilar malignancy constricting hilar structures including the pulmonary arteries and particularly the LEFT lower lobe bronchus. 3. LEFT lower bronchus is completely constricted with new atelectasis of the LEFT lower lobe. New loculated pleural effusion and nodularity in the LEFT hemithorax concerning for pleural spread of metastasis. 4. Aggressive bulky lytic lesion in the RIGHT scapula extending into periscapular musculature Abdomen / Pelvis Impression: 1. SMALL BOWEL OBSTRUCTION PATTERN: New moderate dilatation small bowel involving the jejunum and moderate portion of the ileum. A caliber change in the distal ileum. No transition point identified. Differential of ileus versus early small bowel obstruction. Fluid in the duodenum is concerning for SMALL BOWEL OBSTRUCTION. 2. Stable hepatic metastasis. 3. Interval increase in adenopathy in the LEFT periaortic retroperitoneum. 4. Stable RIGHT adrenal metastasis. These results will be called to the ordering clinician or representative by the Radiologist Assistant, and communication documented in the PACS or zVision Dashboard. Electronically Signed   By: Suzy Bouchard M.D.   On: 04/22/2018 14:08   Ct Abdomen Pelvis W Contrast  Result Date: 04/22/2018 CLINICAL DATA:  Lung cancer with bony metastasis to the RIGHT shoulder and liver metastasis. EXAM: CT ANGIOGRAPHY CHEST CT ABDOMEN AND PELVIS WITH CONTRAST  TECHNIQUE: Multidetector CT imaging of the chest was performed using the standard protocol during bolus administration of intravenous contrast. Multiplanar CT image reconstructions and MIPs were obtained to evaluate the vascular anatomy. Multidetector CT imaging of the abdomen and pelvis was performed using the standard protocol during bolus administration of intravenous contrast. CONTRAST:  11mL ISOVUE-370 IOPAMIDOL (ISOVUE-370) INJECTION 76% COMPARISON:  CT 03/09/2018, 01/28/2018 FINDINGS: CTA CHEST FINDINGS Cardiovascular: There is narrowing of the branches of the LEFT lobe pulmonary arteries by a LEFT hilar tumor as well as narrowing lingular branch however there is no endoluminal filling defect suggest acute pulmonary embolism. No pulmonary embolism within the RIGHT lung either. Port in the anterior chest wall with tip in distal SVC. Mediastinum/Nodes: No axillary or supraclavicular enlarged nodes. No mediastinal lymphadenopathy. Esophagus is patulous scratch the esophagus is fluid-filled unchanged from prior. Lungs/Pleura: LEFT perihilar mass surrounding structures of the LEFT hilum again noted. There is complete obstruction of the LEFT lobe bronchus. There is new/increased LEFT lower lobe atelectasis. There is loculated effusion at the LEFT lung base new from prior. There is nodular thickening of the pleural space. Small RIGHT effusion.  Mild interstitial edema. Musculoskeletal: Destructive lesion involving the RIGHT scapula extending the soft tissue musculature of the RIGHT shoulder again noted. Review of the MIP images confirms the above findings. CT ABDOMEN and PELVIS FINDINGS Hepatobiliary: Round targetoid lesions within the LEFT and RIGHT hepatic lobe consistent metastasis not changed from prior. Gallbladder is distended to 4.7 cm. Common bile duct normal caliber. Pancreas:  Pancreas is normal. No ductal dilatation. No pancreatic inflammation. Spleen: Normal spleen Adrenals/urinary tract: The thickening  of the RIGHT adrenal gland to 11 mm consistent with metastasis. No change. Kidneys, ureters bladder normal Stomach/Bowel: Stomach is normal. Fluid within the esophagus noted. There is dilatation of the duodenum and proximal small bowel. Air-fluid levels within the small bowel. The small bowel mild distension (approximately 3.5 cm) is uniform and extends into the distal small bowel with there is caliber change leading up to the terminal ileum. There is no clear transition point identified. No pneumatosis. No portal venous gas. Vascular/Lymphatic: Abdominal aorta normal. Retroperitoneal lymph node LEFT aorta increased in size measuring 12 mm (image 34/12 Reproductive: Prostate normal Other: No free fluid. Musculoskeletal: The sclerotic lesions in the lumbar spine consistent metastasis more prominent than prior (image 80/16 involving the L3 and L4 vertebral bodies. Aggressive RIGHT scapular lesion again noted. Review of the MIP images confirms the above findings. IMPRESSION: Chest Impression: 1. No evidence acute pulmonary embolism. 2. Interval increase in RIGHT hilar malignancy constricting hilar structures including the pulmonary arteries and particularly the LEFT lower lobe bronchus. 3. LEFT lower bronchus is completely constricted with new atelectasis of the LEFT lower lobe. New loculated pleural effusion and nodularity in the LEFT hemithorax concerning for pleural spread of metastasis. 4. Aggressive bulky lytic lesion in the RIGHT scapula extending into periscapular musculature Abdomen / Pelvis Impression: 1. SMALL BOWEL OBSTRUCTION PATTERN: New moderate dilatation small bowel involving the jejunum and moderate portion of the ileum. A caliber change in the distal ileum. No transition point identified. Differential of ileus versus early small bowel obstruction. Fluid in the duodenum is concerning for SMALL BOWEL OBSTRUCTION. 2. Stable hepatic metastasis. 3. Interval increase in adenopathy in the LEFT periaortic  retroperitoneum. 4. Stable RIGHT adrenal metastasis. These results will be called to the ordering clinician or representative by the Radiologist Assistant, and communication documented in the PACS or zVision Dashboard. Electronically Signed   By: Suzy Bouchard M.D.   On: 04/22/2018 14:08   Ir Fluoro Rm 30-60 Min  Result Date: 04/25/2018 INDICATION: History metastatic lung cancer, now with chronic enteric obstructive symptoms. Request made for placement of a percutaneous gastrostomy tube for venting purposes. EXAM: IR FLUORO RM 0-60 MIN COMPARISON:  None. MEDICATIONS: Zofran 4 mg IV CONTRAST:  None ANESTHESIA/SEDATION: None FLUOROSCOPY TIME:  None COMPLICATIONS: None immediate. PROCEDURE: Informed written consent was obtained from the patient following explanation of the procedure, risks, benefits and alternatives. Despite obtaining consent from the patient and the patient's wife prior to the procedure, once placed supine on the fluoroscopy table, the patient experienced nausea and several episodes of vomiting and despite being administered Zofran, ultimately elected not to pursue gastrostomy tube placement. This was discussed at great length with both the patient the patient's wife however ultimately, the patient, who (per his wife) maintains medical decision making capacity, refuses to undergo gastrostomy tube placement at the present time. No procedure attempted. FINDINGS: Patient refusal as detailed above. IMPRESSION: Patient refused percutaneous gastrostomy tube placement. No procedure attempted. Electronically Signed   By: Sandi Mariscal M.D.   On: 04/25/2018 13:52   Dg Abd 2 Views  Result Date: 04/24/2018 CLINICAL DATA:  Nausea, vomiting, small bowel obstruction. EXAM: ABDOMEN - 2 VIEW COMPARISON:  Radiographs of April 21, 2018. CT scan of April 22, 2018. FINDINGS: Mildly dilated small bowel loops are noted concerning for distal small bowel obstruction or ileus. No colonic dilatation is noted. Stool  is noted in the rectum.  There is no evidence of free air. No radio-opaque calculi or other significant radiographic abnormality is seen. IMPRESSION: Mildly dilated small bowel loops are noted concerning for distal small bowel obstruction or ileus. Electronically Signed   By: Marijo Conception, M.D.   On: 04/24/2018 12:54   Dg Abd 2 Views  Result Date: 04/21/2018 CLINICAL DATA:  Persistent vomiting, weakness, history of coronary artery disease post MI, metastatic lung cancer, hypertension, stroke EXAM: ABDOMEN - 2 VIEW COMPARISON:  CT abdomen and pelvis 03/09/2018 FINDINGS: Numerous air-filled distended loops of small bowel throughout abdomen. Small amounts of stool within colon and rectum. Paucity of colonic gas. No definite bowel wall thickening or free air. Gaseous distention of stomach. No acute osseous findings. IMPRESSION: Numerous distended loops of small bowel throughout the abdomen with paucity of colonic gas and mild scattered stool in colon, question small-bowel obstruction versus ileus. No evidence of perforation. Electronically Signed   By: Lavonia Dana M.D.   On: 04/21/2018 12:55   Dg Abd Portable 1v  Result Date: 04/28/2018 CLINICAL DATA:  Follow-up ileus EXAM: PORTABLE ABDOMEN - 1 VIEW COMPARISON:  04/26/2018 FINDINGS: Scattered large and small bowel gas is noted. Improvement in the degree of small bowel dilatation is noted consistent with improving ileus. Retained fecal material remains in the left colon. No free air is seen. No free air is seen. No acute bony abnormality is noted. IMPRESSION: Continued improvement in the degree of small bowel dilatation. Electronically Signed   By: Inez Catalina M.D.   On: 04/28/2018 08:37   Dg Abd Portable 1v  Result Date: 04/26/2018 CLINICAL DATA:  Follow up small bowel obstruction EXAM: PORTABLE ABDOMEN - 1 VIEW COMPARISON:  04/25/2018 FINDINGS: Scattered large and small bowel gas is again identified. Considerable retained fecal material is noted within the  left colon. Small bowel dilatation is again identified stable from the previous exam. No new focal abnormality is noted. IMPRESSION: Stable small bowel dilatation likely related to underlying ileus. Retained fecal material within the left colon. Electronically Signed   By: Inez Catalina M.D.   On: 04/26/2018 08:38   Dg Abd Portable 1v  Result Date: 04/25/2018 CLINICAL DATA:  Small bowel obstruction. EXAM: PORTABLE ABDOMEN - 1 VIEW COMPARISON:  Radiographs of April 21, 2018. FINDINGS: Small bowel dilatation noted on prior exam is significantly improved, with residual mildly dilated loop seen in the left upper quadrant, most consistent with improving ileus. Phleboliths are noted in the pelvis. No nephrolithiasis is noted. IMPRESSION: Significantly improved small bowel dilatation is noted most consistent with improving ileus. Electronically Signed   By: Marijo Conception, M.D.   On: 04/25/2018 14:51      TODAY-DAY OF DISCHARGE:  Subjective:   Alonna Buckler today continues to be very lethargic-this morning he seems to be accumulating some secretions.  He has hardly any oral intake in the past 24 hours.  Objective:   Blood pressure 103/73, pulse (!) 113, temperature (!) 97.4 F (36.3 C), temperature source Oral, resp. rate 20, height 5\' 11"  (1.803 m), weight 63.6 kg, SpO2 94 %.  Intake/Output Summary (Last 24 hours) at 05/01/2018 0839 Last data filed at 05/01/2018 0500 Gross per 24 hour  Intake 653.52 ml  Output 1020 ml  Net -366.48 ml   Filed Weights   04/20/18 1105 05/01/18 0300  Weight: 70 kg 63.6 kg    Exam: Awake but very lethargic. Supple Neck,No JVD, No cervical lymphadenopathy appriciated.  Symmetrical Chest wall movement, Good air movement bilaterally, transmitted  upper airway sounds RRR,No Gallops,Rubs or new Murmurs, No Parasternal Heave +ve B.Sounds, Abd Soft, Non tender, No organomegaly appriciated, No rebound -guarding or rigidity. No Cyanosis, Clubbing or edema, No new Rash  or bruise   PERTINENT RADIOLOGIC STUDIES: Ct Angio Chest Pe W Or Wo Contrast  Result Date: 04/22/2018 CLINICAL DATA:  Lung cancer with bony metastasis to the RIGHT shoulder and liver metastasis. EXAM: CT ANGIOGRAPHY CHEST CT ABDOMEN AND PELVIS WITH CONTRAST TECHNIQUE: Multidetector CT imaging of the chest was performed using the standard protocol during bolus administration of intravenous contrast. Multiplanar CT image reconstructions and MIPs were obtained to evaluate the vascular anatomy. Multidetector CT imaging of the abdomen and pelvis was performed using the standard protocol during bolus administration of intravenous contrast. CONTRAST:  118mL ISOVUE-370 IOPAMIDOL (ISOVUE-370) INJECTION 76% COMPARISON:  CT 03/09/2018, 01/28/2018 FINDINGS: CTA CHEST FINDINGS Cardiovascular: There is narrowing of the branches of the LEFT lobe pulmonary arteries by a LEFT hilar tumor as well as narrowing lingular branch however there is no endoluminal filling defect suggest acute pulmonary embolism. No pulmonary embolism within the RIGHT lung either. Port in the anterior chest wall with tip in distal SVC. Mediastinum/Nodes: No axillary or supraclavicular enlarged nodes. No mediastinal lymphadenopathy. Esophagus is patulous scratch the esophagus is fluid-filled unchanged from prior. Lungs/Pleura: LEFT perihilar mass surrounding structures of the LEFT hilum again noted. There is complete obstruction of the LEFT lobe bronchus. There is new/increased LEFT lower lobe atelectasis. There is loculated effusion at the LEFT lung base new from prior. There is nodular thickening of the pleural space. Small RIGHT effusion.  Mild interstitial edema. Musculoskeletal: Destructive lesion involving the RIGHT scapula extending the soft tissue musculature of the RIGHT shoulder again noted. Review of the MIP images confirms the above findings. CT ABDOMEN and PELVIS FINDINGS Hepatobiliary: Round targetoid lesions within the LEFT and RIGHT  hepatic lobe consistent metastasis not changed from prior. Gallbladder is distended to 4.7 cm. Common bile duct normal caliber. Pancreas: Pancreas is normal. No ductal dilatation. No pancreatic inflammation. Spleen: Normal spleen Adrenals/urinary tract: The thickening of the RIGHT adrenal gland to 11 mm consistent with metastasis. No change. Kidneys, ureters bladder normal Stomach/Bowel: Stomach is normal. Fluid within the esophagus noted. There is dilatation of the duodenum and proximal small bowel. Air-fluid levels within the small bowel. The small bowel mild distension (approximately 3.5 cm) is uniform and extends into the distal small bowel with there is caliber change leading up to the terminal ileum. There is no clear transition point identified. No pneumatosis. No portal venous gas. Vascular/Lymphatic: Abdominal aorta normal. Retroperitoneal lymph node LEFT aorta increased in size measuring 12 mm (image 34/12 Reproductive: Prostate normal Other: No free fluid. Musculoskeletal: The sclerotic lesions in the lumbar spine consistent metastasis more prominent than prior (image 80/16 involving the L3 and L4 vertebral bodies. Aggressive RIGHT scapular lesion again noted. Review of the MIP images confirms the above findings. IMPRESSION: Chest Impression: 1. No evidence acute pulmonary embolism. 2. Interval increase in RIGHT hilar malignancy constricting hilar structures including the pulmonary arteries and particularly the LEFT lower lobe bronchus. 3. LEFT lower bronchus is completely constricted with new atelectasis of the LEFT lower lobe. New loculated pleural effusion and nodularity in the LEFT hemithorax concerning for pleural spread of metastasis. 4. Aggressive bulky lytic lesion in the RIGHT scapula extending into periscapular musculature Abdomen / Pelvis Impression: 1. SMALL BOWEL OBSTRUCTION PATTERN: New moderate dilatation small bowel involving the jejunum and moderate portion of the ileum. A caliber change  in the distal ileum. No transition point identified. Differential of ileus versus early small bowel obstruction. Fluid in the duodenum is concerning for SMALL BOWEL OBSTRUCTION. 2. Stable hepatic metastasis. 3. Interval increase in adenopathy in the LEFT periaortic retroperitoneum. 4. Stable RIGHT adrenal metastasis. These results will be called to the ordering clinician or representative by the Radiologist Assistant, and communication documented in the PACS or zVision Dashboard. Electronically Signed   By: Suzy Bouchard M.D.   On: 04/22/2018 14:08   Ct Abdomen Pelvis W Contrast  Result Date: 04/22/2018 CLINICAL DATA:  Lung cancer with bony metastasis to the RIGHT shoulder and liver metastasis. EXAM: CT ANGIOGRAPHY CHEST CT ABDOMEN AND PELVIS WITH CONTRAST TECHNIQUE: Multidetector CT imaging of the chest was performed using the standard protocol during bolus administration of intravenous contrast. Multiplanar CT image reconstructions and MIPs were obtained to evaluate the vascular anatomy. Multidetector CT imaging of the abdomen and pelvis was performed using the standard protocol during bolus administration of intravenous contrast. CONTRAST:  162mL ISOVUE-370 IOPAMIDOL (ISOVUE-370) INJECTION 76% COMPARISON:  CT 03/09/2018, 01/28/2018 FINDINGS: CTA CHEST FINDINGS Cardiovascular: There is narrowing of the branches of the LEFT lobe pulmonary arteries by a LEFT hilar tumor as well as narrowing lingular branch however there is no endoluminal filling defect suggest acute pulmonary embolism. No pulmonary embolism within the RIGHT lung either. Port in the anterior chest wall with tip in distal SVC. Mediastinum/Nodes: No axillary or supraclavicular enlarged nodes. No mediastinal lymphadenopathy. Esophagus is patulous scratch the esophagus is fluid-filled unchanged from prior. Lungs/Pleura: LEFT perihilar mass surrounding structures of the LEFT hilum again noted. There is complete obstruction of the LEFT lobe  bronchus. There is new/increased LEFT lower lobe atelectasis. There is loculated effusion at the LEFT lung base new from prior. There is nodular thickening of the pleural space. Small RIGHT effusion.  Mild interstitial edema. Musculoskeletal: Destructive lesion involving the RIGHT scapula extending the soft tissue musculature of the RIGHT shoulder again noted. Review of the MIP images confirms the above findings. CT ABDOMEN and PELVIS FINDINGS Hepatobiliary: Round targetoid lesions within the LEFT and RIGHT hepatic lobe consistent metastasis not changed from prior. Gallbladder is distended to 4.7 cm. Common bile duct normal caliber. Pancreas: Pancreas is normal. No ductal dilatation. No pancreatic inflammation. Spleen: Normal spleen Adrenals/urinary tract: The thickening of the RIGHT adrenal gland to 11 mm consistent with metastasis. No change. Kidneys, ureters bladder normal Stomach/Bowel: Stomach is normal. Fluid within the esophagus noted. There is dilatation of the duodenum and proximal small bowel. Air-fluid levels within the small bowel. The small bowel mild distension (approximately 3.5 cm) is uniform and extends into the distal small bowel with there is caliber change leading up to the terminal ileum. There is no clear transition point identified. No pneumatosis. No portal venous gas. Vascular/Lymphatic: Abdominal aorta normal. Retroperitoneal lymph node LEFT aorta increased in size measuring 12 mm (image 34/12 Reproductive: Prostate normal Other: No free fluid. Musculoskeletal: The sclerotic lesions in the lumbar spine consistent metastasis more prominent than prior (image 80/16 involving the L3 and L4 vertebral bodies. Aggressive RIGHT scapular lesion again noted. Review of the MIP images confirms the above findings. IMPRESSION: Chest Impression: 1. No evidence acute pulmonary embolism. 2. Interval increase in RIGHT hilar malignancy constricting hilar structures including the pulmonary arteries and  particularly the LEFT lower lobe bronchus. 3. LEFT lower bronchus is completely constricted with new atelectasis of the LEFT lower lobe. New loculated pleural effusion and nodularity in the LEFT hemithorax concerning for pleural  spread of metastasis. 4. Aggressive bulky lytic lesion in the RIGHT scapula extending into periscapular musculature Abdomen / Pelvis Impression: 1. SMALL BOWEL OBSTRUCTION PATTERN: New moderate dilatation small bowel involving the jejunum and moderate portion of the ileum. A caliber change in the distal ileum. No transition point identified. Differential of ileus versus early small bowel obstruction. Fluid in the duodenum is concerning for SMALL BOWEL OBSTRUCTION. 2. Stable hepatic metastasis. 3. Interval increase in adenopathy in the LEFT periaortic retroperitoneum. 4. Stable RIGHT adrenal metastasis. These results will be called to the ordering clinician or representative by the Radiologist Assistant, and communication documented in the PACS or zVision Dashboard. Electronically Signed   By: Suzy Bouchard M.D.   On: 04/22/2018 14:08   Ir Fluoro Rm 30-60 Min  Result Date: 04/25/2018 INDICATION: History metastatic lung cancer, now with chronic enteric obstructive symptoms. Request made for placement of a percutaneous gastrostomy tube for venting purposes. EXAM: IR FLUORO RM 0-60 MIN COMPARISON:  None. MEDICATIONS: Zofran 4 mg IV CONTRAST:  None ANESTHESIA/SEDATION: None FLUOROSCOPY TIME:  None COMPLICATIONS: None immediate. PROCEDURE: Informed written consent was obtained from the patient following explanation of the procedure, risks, benefits and alternatives. Despite obtaining consent from the patient and the patient's wife prior to the procedure, once placed supine on the fluoroscopy table, the patient experienced nausea and several episodes of vomiting and despite being administered Zofran, ultimately elected not to pursue gastrostomy tube placement. This was discussed at great  length with both the patient the patient's wife however ultimately, the patient, who (per his wife) maintains medical decision making capacity, refuses to undergo gastrostomy tube placement at the present time. No procedure attempted. FINDINGS: Patient refusal as detailed above. IMPRESSION: Patient refused percutaneous gastrostomy tube placement. No procedure attempted. Electronically Signed   By: Sandi Mariscal M.D.   On: 04/25/2018 13:52   Dg Abd 2 Views  Result Date: 04/24/2018 CLINICAL DATA:  Nausea, vomiting, small bowel obstruction. EXAM: ABDOMEN - 2 VIEW COMPARISON:  Radiographs of April 21, 2018. CT scan of April 22, 2018. FINDINGS: Mildly dilated small bowel loops are noted concerning for distal small bowel obstruction or ileus. No colonic dilatation is noted. Stool is noted in the rectum. There is no evidence of free air. No radio-opaque calculi or other significant radiographic abnormality is seen. IMPRESSION: Mildly dilated small bowel loops are noted concerning for distal small bowel obstruction or ileus. Electronically Signed   By: Marijo Conception, M.D.   On: 04/24/2018 12:54   Dg Abd 2 Views  Result Date: 04/21/2018 CLINICAL DATA:  Persistent vomiting, weakness, history of coronary artery disease post MI, metastatic lung cancer, hypertension, stroke EXAM: ABDOMEN - 2 VIEW COMPARISON:  CT abdomen and pelvis 03/09/2018 FINDINGS: Numerous air-filled distended loops of small bowel throughout abdomen. Small amounts of stool within colon and rectum. Paucity of colonic gas. No definite bowel wall thickening or free air. Gaseous distention of stomach. No acute osseous findings. IMPRESSION: Numerous distended loops of small bowel throughout the abdomen with paucity of colonic gas and mild scattered stool in colon, question small-bowel obstruction versus ileus. No evidence of perforation. Electronically Signed   By: Lavonia Dana M.D.   On: 04/21/2018 12:55   Dg Abd Portable 1v  Result Date:  04/28/2018 CLINICAL DATA:  Follow-up ileus EXAM: PORTABLE ABDOMEN - 1 VIEW COMPARISON:  04/26/2018 FINDINGS: Scattered large and small bowel gas is noted. Improvement in the degree of small bowel dilatation is noted consistent with improving ileus. Retained fecal material  remains in the left colon. No free air is seen. No free air is seen. No acute bony abnormality is noted. IMPRESSION: Continued improvement in the degree of small bowel dilatation. Electronically Signed   By: Inez Catalina M.D.   On: 04/28/2018 08:37   Dg Abd Portable 1v  Result Date: 04/26/2018 CLINICAL DATA:  Follow up small bowel obstruction EXAM: PORTABLE ABDOMEN - 1 VIEW COMPARISON:  04/25/2018 FINDINGS: Scattered large and small bowel gas is again identified. Considerable retained fecal material is noted within the left colon. Small bowel dilatation is again identified stable from the previous exam. No new focal abnormality is noted. IMPRESSION: Stable small bowel dilatation likely related to underlying ileus. Retained fecal material within the left colon. Electronically Signed   By: Inez Catalina M.D.   On: 04/26/2018 08:38   Dg Abd Portable 1v  Result Date: 04/25/2018 CLINICAL DATA:  Small bowel obstruction. EXAM: PORTABLE ABDOMEN - 1 VIEW COMPARISON:  Radiographs of April 21, 2018. FINDINGS: Small bowel dilatation noted on prior exam is significantly improved, with residual mildly dilated loop seen in the left upper quadrant, most consistent with improving ileus. Phleboliths are noted in the pelvis. No nephrolithiasis is noted. IMPRESSION: Significantly improved small bowel dilatation is noted most consistent with improving ileus. Electronically Signed   By: Marijo Conception, M.D.   On: 04/25/2018 14:51     PERTINENT LAB RESULTS: CBC: Recent Labs    04/29/18 0342  WBC 8.3  HGB 10.1*  HCT 31.8*  PLT 141*   CMET CMP     Component Value Date/Time   NA 134 (L) 04/30/2018 0304   K 3.5 04/30/2018 0304   CL 104 04/30/2018  0304   CO2 19 (L) 04/30/2018 0304   GLUCOSE 100 (H) 04/30/2018 0304   BUN 11 04/30/2018 0304   CREATININE 0.61 04/30/2018 0304   CALCIUM 7.1 (L) 04/30/2018 0304   PROT 5.6 (L) 04/28/2018 0521   ALBUMIN 1.7 (L) 04/28/2018 0521   AST 18 04/28/2018 0521   ALT 11 04/28/2018 0521   ALKPHOS 215 (H) 04/28/2018 0521   BILITOT 1.2 04/28/2018 0521   GFRNONAA >60 04/30/2018 0304   GFRAA >60 04/30/2018 0304    GFR Estimated Creatinine Clearance: 92.8 mL/min (by C-G formula based on SCr of 0.61 mg/dL). No results for input(s): LIPASE, AMYLASE in the last 72 hours. No results for input(s): CKTOTAL, CKMB, CKMBINDEX, TROPONINI in the last 72 hours. Invalid input(s): POCBNP No results for input(s): DDIMER in the last 72 hours. No results for input(s): HGBA1C in the last 72 hours. No results for input(s): CHOL, HDL, LDLCALC, TRIG, CHOLHDL, LDLDIRECT in the last 72 hours. No results for input(s): TSH, T4TOTAL, T3FREE, THYROIDAB in the last 72 hours.  Invalid input(s): FREET3 No results for input(s): VITAMINB12, FOLATE, FERRITIN, TIBC, IRON, RETICCTPCT in the last 72 hours. Coags: No results for input(s): INR in the last 72 hours.  Invalid input(s): PT Microbiology: No results found for this or any previous visit (from the past 240 hour(s)).  FURTHER DISCHARGE INSTRUCTIONS:  Get Medicines reviewed and adjusted: Please take all your medications with you for your next visit with your Primary MD  Laboratory/radiological data: Please request your Primary MD to go over all hospital tests and procedure/radiological results at the follow up, please ask your Primary MD to get all Hospital records sent to his/her office.  In some cases, they will be blood work, cultures and biopsy results pending at the time of your discharge. Please request  that your primary care M.D. goes through all the records of your hospital data and follows up on these results.  Also Note the following: If you experience  worsening of your admission symptoms, develop shortness of breath, life threatening emergency, suicidal or homicidal thoughts you must seek medical attention immediately by calling 911 or calling your MD immediately  if symptoms less severe.  You must read complete instructions/literature along with all the possible adverse reactions/side effects for all the Medicines you take and that have been prescribed to you. Take any new Medicines after you have completely understood and accpet all the possible adverse reactions/side effects.   Do not drive when taking Pain medications or sleeping medications (Benzodaizepines)  Do not take more than prescribed Pain, Sleep and Anxiety Medications. It is not advisable to combine anxiety,sleep and pain medications without talking with your primary care practitioner  Special Instructions: If you have smoked or chewed Tobacco  in the last 2 yrs please stop smoking, stop any regular Alcohol  and or any Recreational drug use.  Wear Seat belts while driving.  Please note: You were cared for by a hospitalist during your hospital stay. Once you are discharged, your primary care physician will handle any further medical issues. Please note that NO REFILLS for any discharge medications will be authorized once you are discharged, as it is imperative that you return to your primary care physician (or establish a relationship with a primary care physician if you do not have one) for your post hospital discharge needs so that they can reassess your need for medications and monitor your lab values.  Total Time spent coordinating discharge including counseling, education and face to face time equals 35 minutes.  SignedOren Binet 05/01/2018 8:39 AM

## 2018-05-01 NOTE — Progress Notes (Addendum)
Patient's family requesting transfer to [facility] since its closer to the family.  The had a contact which spoke with Dr. Rosezetta Schlatter.  They had a bed open and will hold til tomorrow morning Dr. Blaine Hamper placed a social worker consult in last night with the following information:  Make things Attention to [facility] per Dr. Sonny Dandy Phone: [phone] Fax: [phone]

## 2018-05-01 NOTE — Progress Notes (Signed)
Writer gave report to Shirlean Mylar at [facility].   Nsg Discharge Note  Admit Date:  04/20/2018 Discharge date: 05/01/2018   [facility] to be D/C'd Hospice per MD order.  AVS completed.  Copy for chart, and copy for patient signed, and dated. Patient/caregiver able to verbalize understanding.  Discharge Medication: Allergies as of 05/01/2018      Reactions   Hydrocodone Hives      Medication List    STOP taking these medications   acetaminophen 500 MG tablet Commonly known as:  TYLENOL   CARBOPLATIN IV   famotidine 20 MG tablet Commonly known as:  PEPCID   folic acid 1 MG tablet Commonly known as:  FOLVITE   lidocaine-prilocaine cream Commonly known as:  EMLA   ondansetron 4 MG disintegrating tablet Commonly known as:  ZOFRAN ODT   PEMBROLIZUMAB IV     TAKE these medications   dexamethasone 4 MG tablet Commonly known as:  DECADRON Take 1 tablet (4 mg total) by mouth daily.   HYDROmorphone 1 MG/ML injection Commonly known as:  DILAUDID Inject 1 mL (1 mg total) into the vein every 2 (two) hours as needed for severe pain.   morphine 30 MG tablet Commonly known as:  MSIR Take 1 tablet (30 mg total) by mouth every 6 (six) hours as needed for severe pain.   scopolamine 1 MG/3DAYS Commonly known as:  TRANSDERM-SCOP Place 1 patch (1.5 mg total) onto the skin every 3 (three) days.   tamsulosin 0.4 MG Caps capsule Commonly known as:  FLOMAX Take 1 capsule (0.4 mg total) by mouth daily.       Discharge Assessment: Vitals:   04/30/18 2148 05/01/18 0625  BP: (!) 147/75 103/73  Pulse: (!) 109 (!) 113  Resp: 18 20  Temp: 98.7 F (37.1 C) (!) 97.4 F (36.3 C)  SpO2: 95% 94%   Skin clean, dry and intact without evidence of skin break down, no evidence of skin tears noted. IV catheter discontinued intact. Site without signs and symptoms of complications - no redness or edema noted at insertion site, patient denies c/o pain - only slight tenderness at  site.  Dressing with slight pressure applied.  D/c Instructions-Education: Discharge instructions given to patient/family with verbalized understanding. D/c education completed with patient/family including follow up instructions, medication list, d/c activities limitations if indicated, with other d/c instructions as indicated by MD - patient able to verbalize understanding, all questions fully answered. Patient instructed to return to ED, call 911, or call MD for any changes in condition.  Patient escorted via [place], and D/C home via private auto.  Erasmo Leventhal, RN 05/01/2018 10:28 AM

## 2018-05-01 NOTE — Plan of Care (Signed)
Discussed with family in front of patient plan of care for the evening , pain management with his prn medications changed and his decreased by mouth intake with some teach back displayed

## 2018-05-22 DEATH — deceased

## 2018-10-12 DIAGNOSIS — I639 Cerebral infarction, unspecified: Secondary | ICD-10-CM | POA: Insufficient documentation

## 2019-11-24 IMAGING — XA IR FLUORO GUIDE CV LINE*L*
1 series · 1 of 1 positions shown · non-contrast
Comparison: none

CLINICAL DATA: METASTATIC LUNG CANCER, ACCESS FOR CHEMOTHERAPY

[Series 1: fl (-) angio · 1 of 1 slices shown]
[im 1/1]
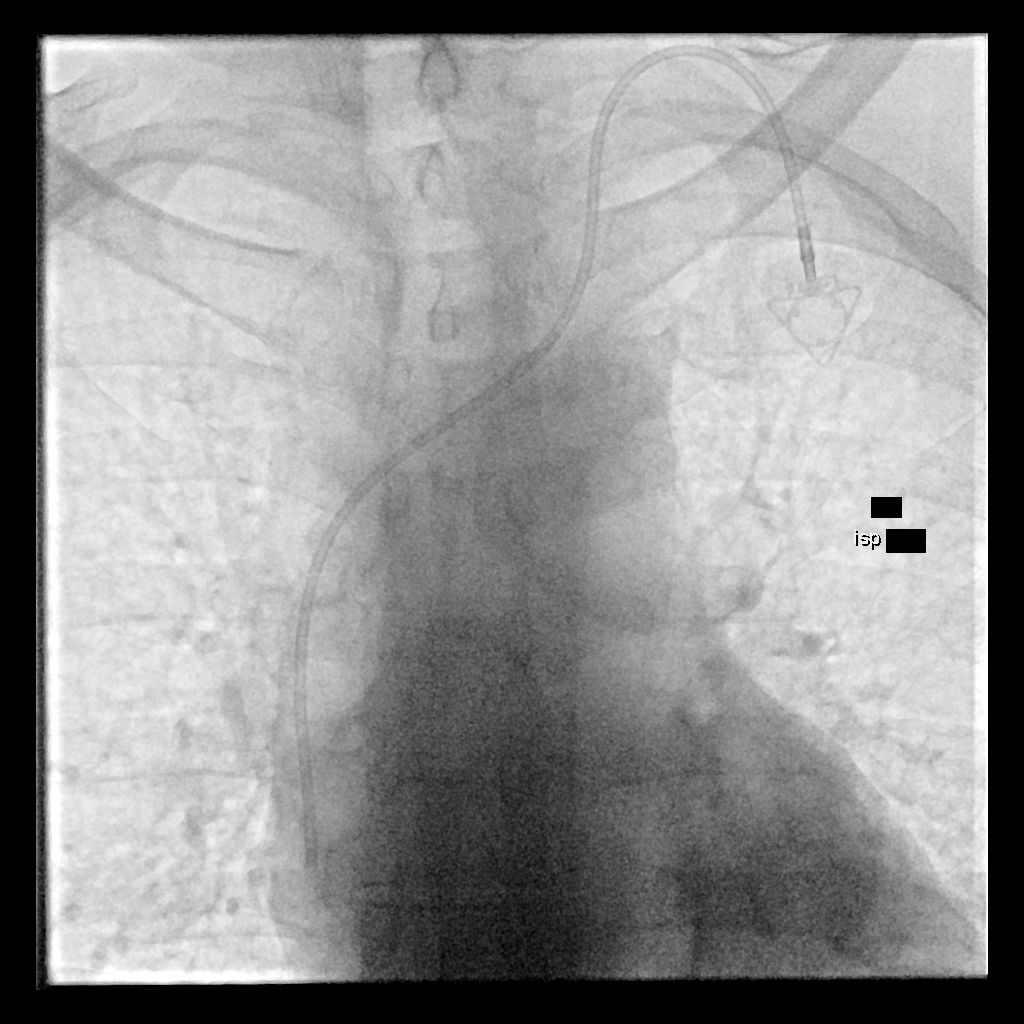

[1 of 1 positions shown; findings below may reference images not displayed]

EXAM:
LEFT INTERNAL JUGULAR SINGLE LUMEN POWER PORT CATHETER INSERTION

Radiologist:  Amazigh, Quirijn

Guidance:  Ultrasound and fluoroscopic

MEDICATIONS:
Ancef 2 g; The antibiotic was administered within an appropriate
time interval prior to skin puncture.

ANESTHESIA/SEDATION:
Versed 2.0 mg IV; Fentanyl 100 mcg IV;

Moderate Sedation Time:  31 minutes

The patient was continuously monitored during the procedure by the
interventional radiology nurse under my direct supervision.

FLUOROSCOPY TIME:  One minutes, 12 seconds (5 mGy)

COMPLICATIONS:
None immediate.

CONTRAST:  None.

PROCEDURE:
Informed consent was obtained from the patient following explanation
of the procedure, risks, benefits and alternatives. The patient
understands, agrees and consents for the procedure. All questions
were addressed. A time out was performed.

Maximal barrier sterile technique utilized including caps, mask,
sterile gowns, sterile gloves, large sterile drape, hand hygiene,
and 2% chlorhexidine scrub.

Under sterile conditions and local anesthesia, left internal jugular
micropuncture venous access was performed. Access was performed with
ultrasound. Images were obtained for documentation. A guide wire was
inserted followed by a transitional dilator. This allowed insertion
of a guide wire and catheter into the IVC. Measurements were
obtained from the SVC / RA junction back to the left IJ venotomy
site. In the left infraclavicular chest, a subcutaneous pocket was
created over the second anterior rib. This was done under sterile
conditions and local anesthesia. 1% lidocaine with epinephrine was
utilized for this. A 2.5 cm incision was made in the skin. Blunt
dissection was performed to create a subcutaneous pocket over the
right pectoralis major muscle. The pocket was flushed with saline
vigorously. There was adequate hemostasis. The port catheter was
assembled and checked for leakage. The port catheter was secured in
the pocket with two retention sutures. The tubing was tunneled
subcutaneously to the left venotomy site and inserted into the
SVC/RA junction through a valved peel-away sheath. Position was
confirmed with fluoroscopy. Images were obtained for documentation.
The patient tolerated the procedure well. No immediate
complications. Incisions were closed in a two layer fashion with 4 -
0 Vicryl suture. Dermabond was applied to the skin. The port
catheter was accessed, blood was aspirated followed by saline and
heparin flushes. Needle was removed. A dry sterile dressing was
applied.
IMPRESSION: Ultrasound and fluoroscopically guided left internal jugular single
lumen power port catheter insertion. Tip in the SVC/RA junction.
Catheter ready for use.

## 2020-02-04 IMAGING — DX DG ABDOMEN 2V
3 series · 3 of 3 positions shown · non-contrast
Comparison: Radiographs April 21, 2018. CT scan April 22, 2018.

CLINICAL DATA: Nausea, vomiting, small bowel obstruction.

EXAM:
ABDOMEN - 2 VIEW

[abdomen erect (1 of 2)]
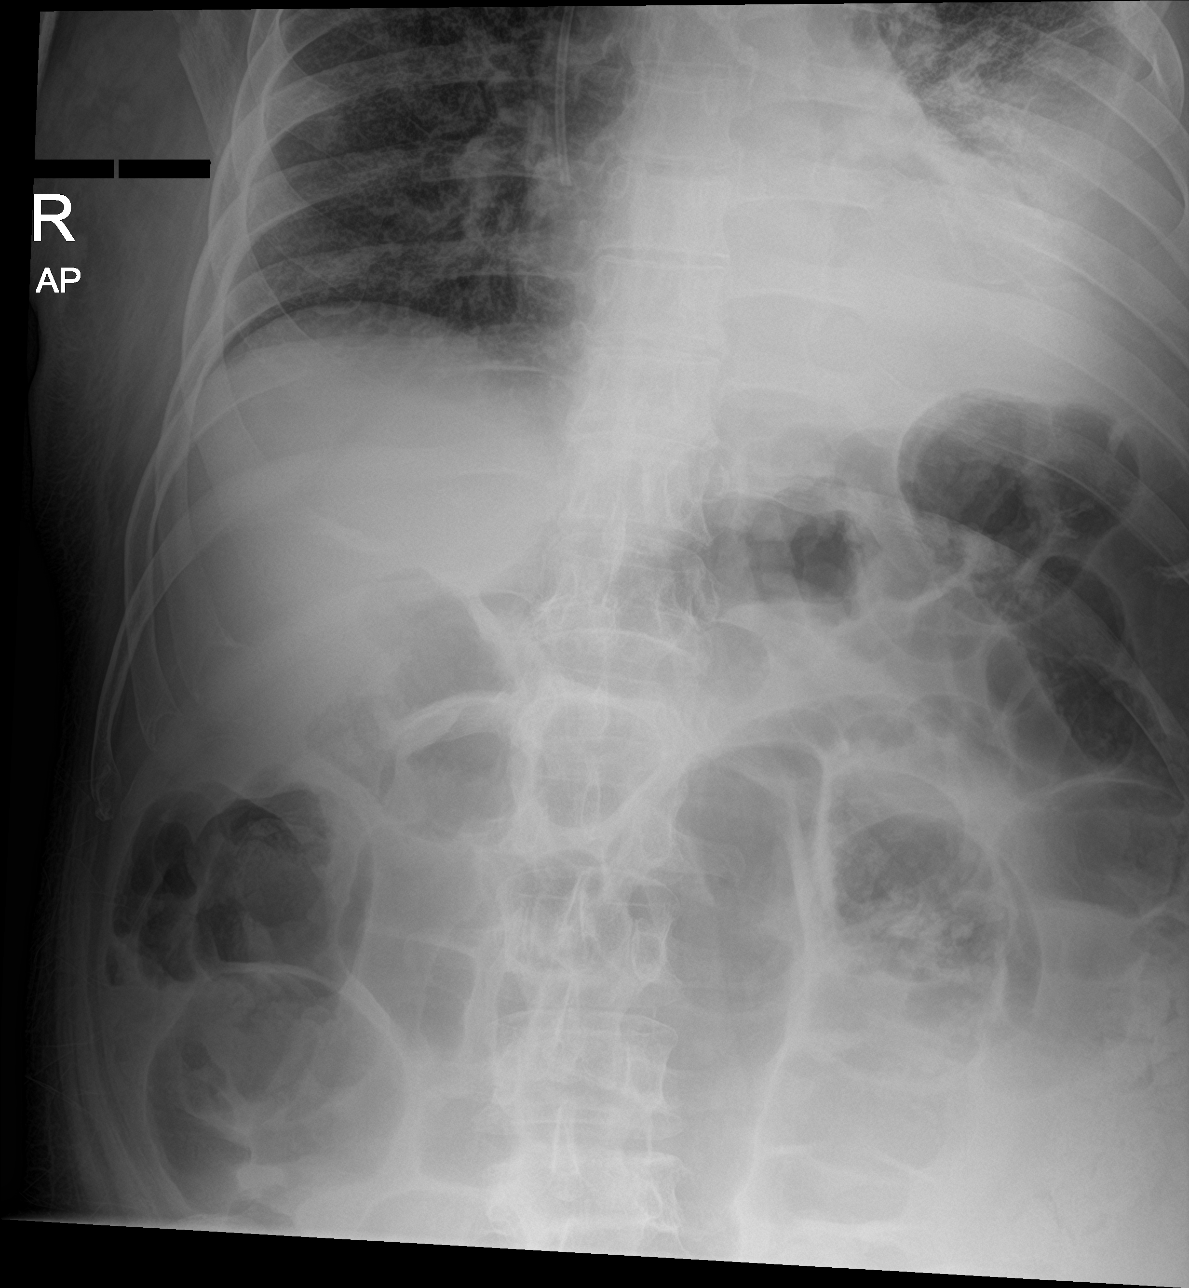

[abdomen supine]
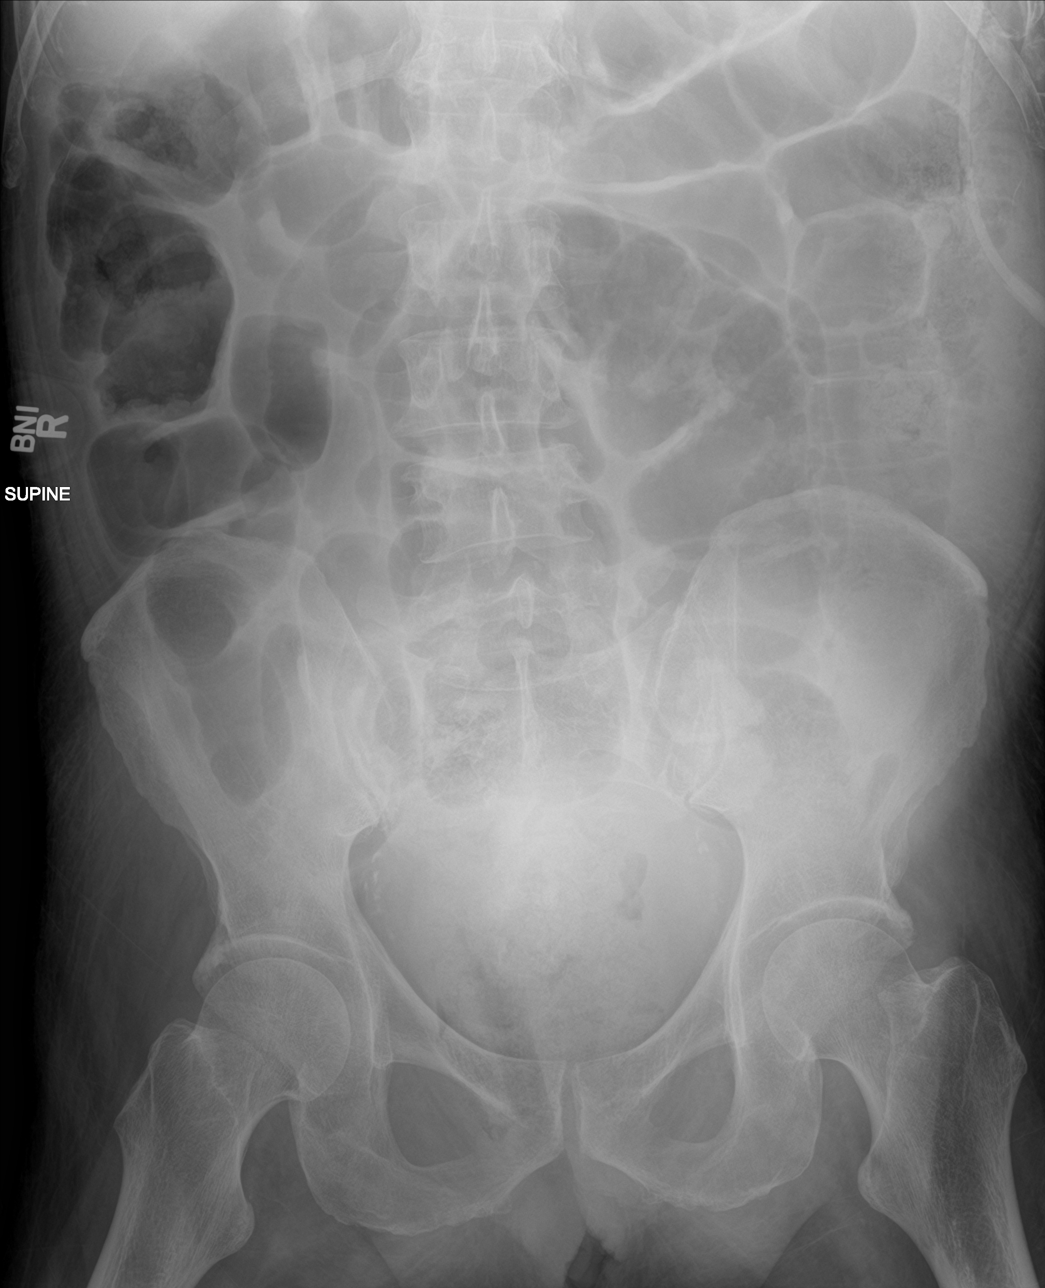

[abdomen erect (2 of 2)]
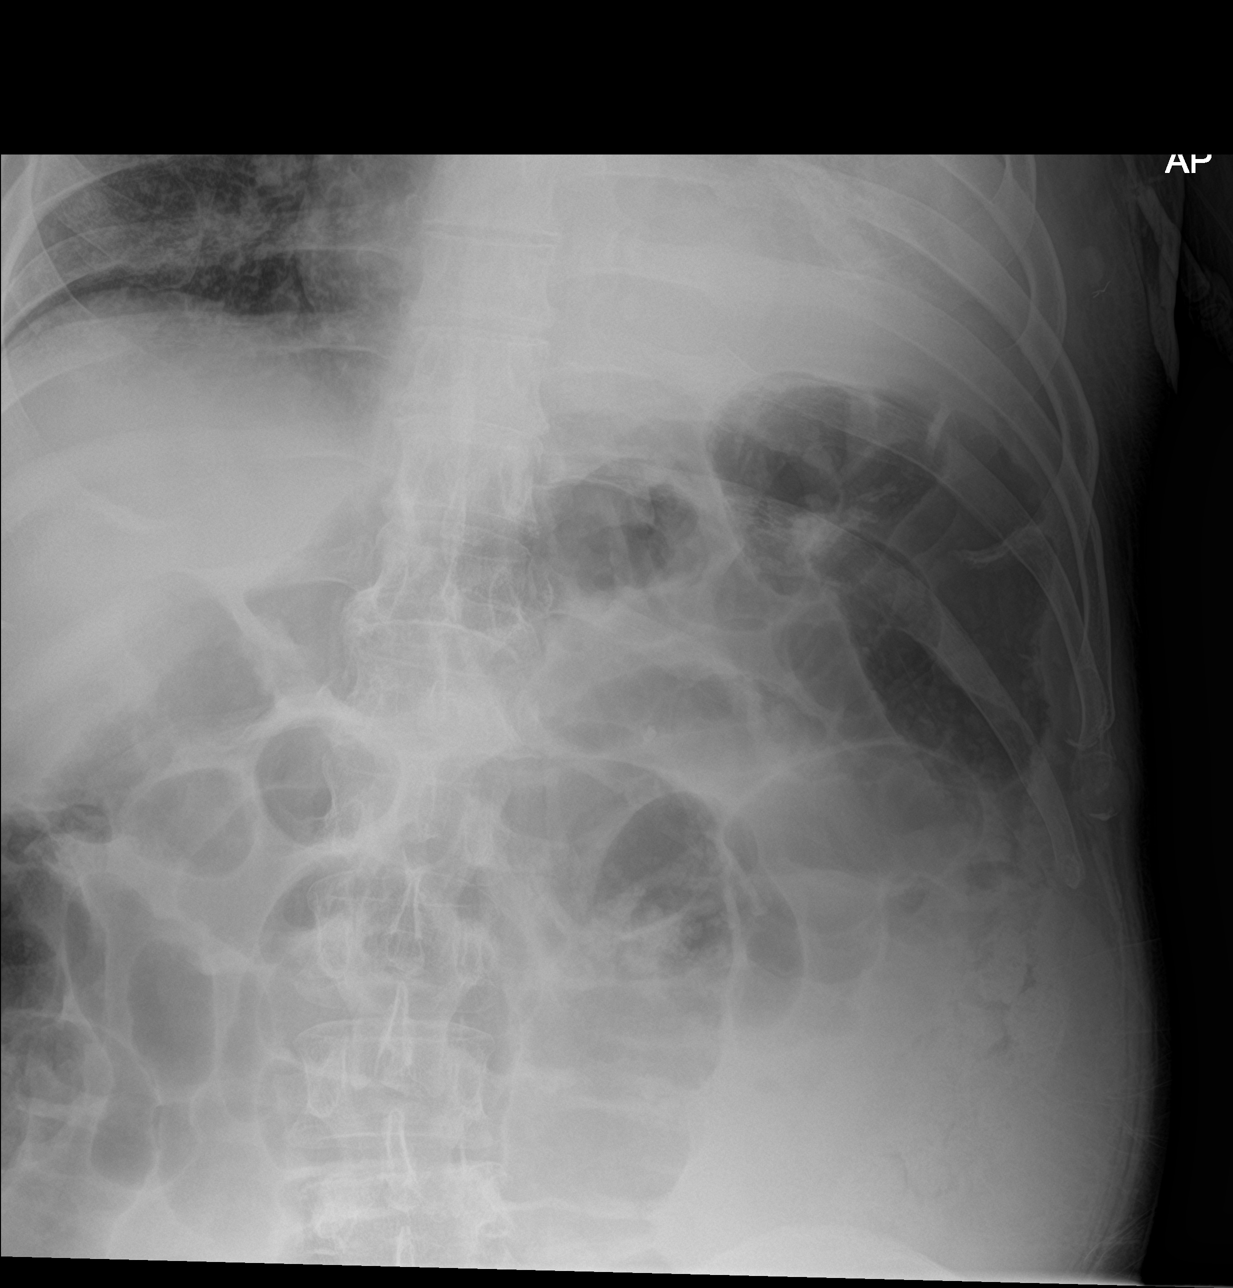

[3 of 3 positions shown; findings below may reference images not displayed]

FINDINGS: Mildly dilated small bowel loops are noted concerning for distal
small bowel obstruction or ileus. No colonic dilatation is noted.
Stool is noted in the rectum.. There is no evidence of free air. No
radio-opaque calculi or other significant radiographic abnormality
is seen.
IMPRESSION: Mildly dilated small bowel loops are noted concerning for distal
small bowel obstruction or ileus.

## 2020-02-05 IMAGING — DX DG ABD PORTABLE 1V
1 series · 1 of 1 positions shown · non-contrast
Comparison: Radiographs April 21, 2018.

CLINICAL DATA: Small bowel obstruction.

EXAM:
PORTABLE ABDOMEN - 1 VIEW

[abdomen]
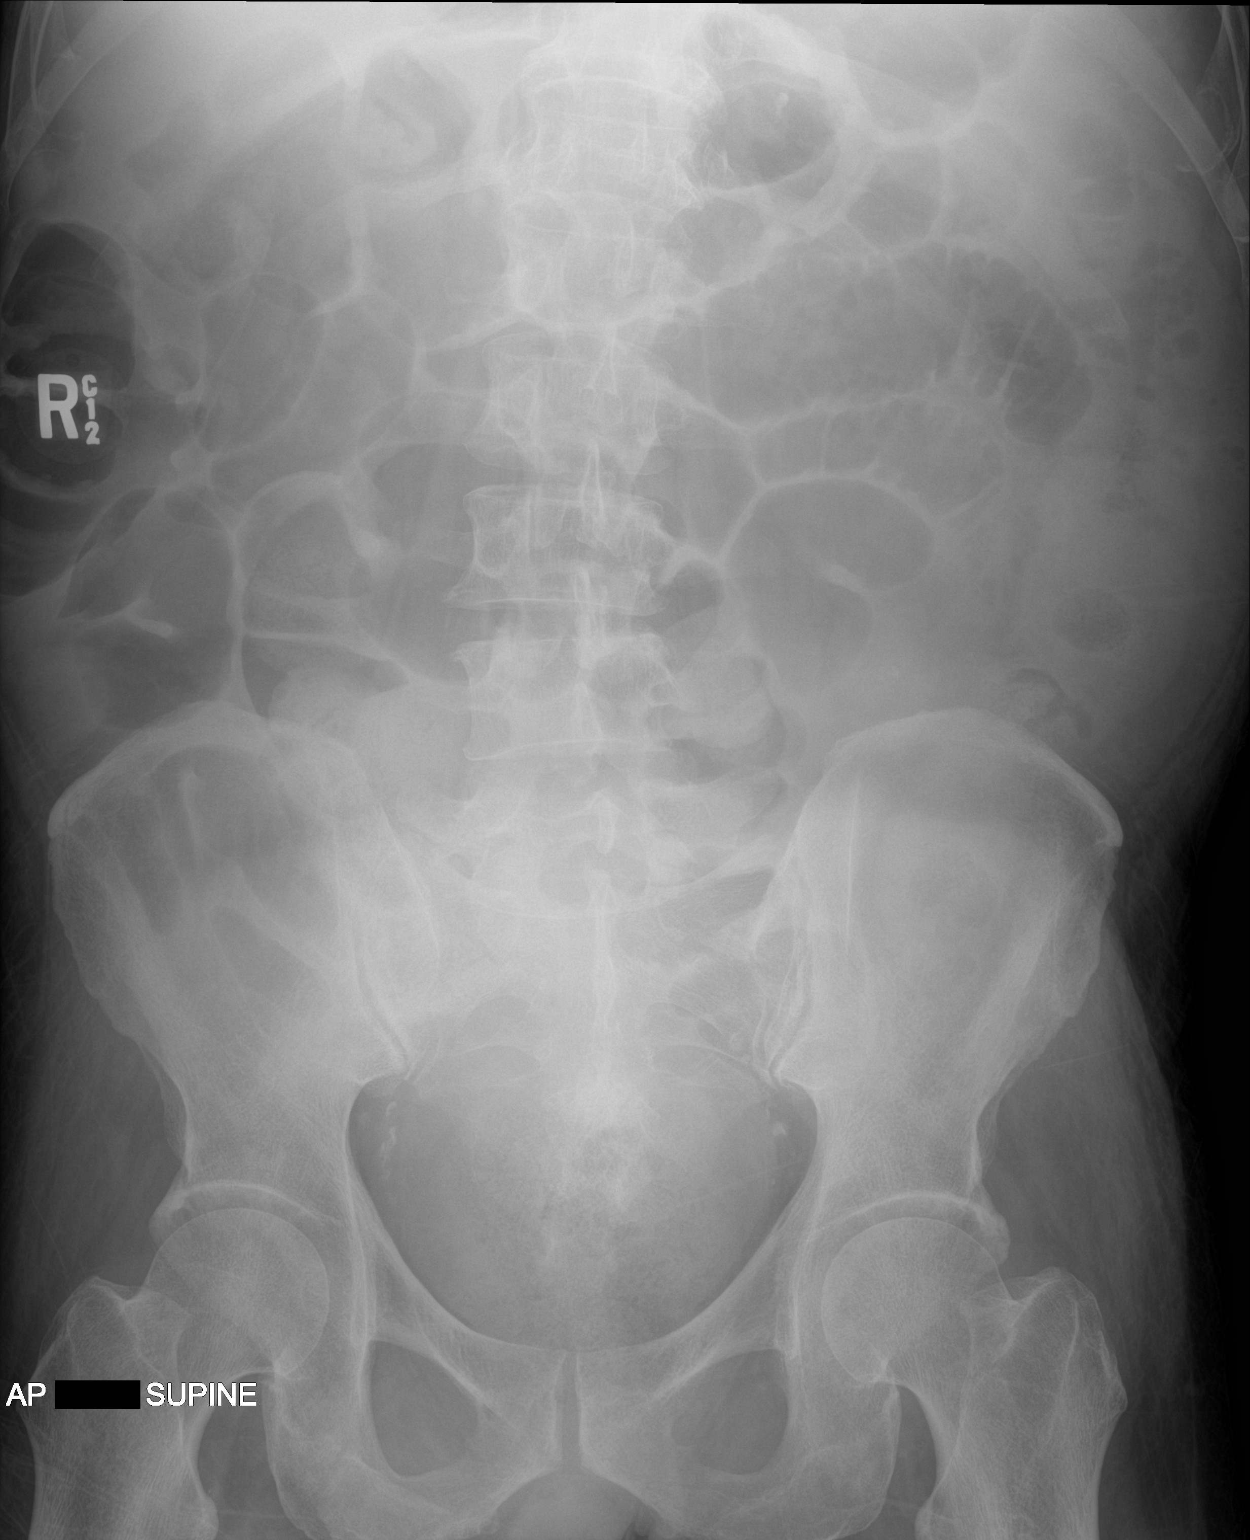

[1 of 1 positions shown; findings below may reference images not displayed]

FINDINGS: Small bowel dilatation noted on prior exam is significantly
improved, with residual mildly dilated loop seen in the left upper
quadrant, most consistent with improving ileus. Phleboliths are
noted in the pelvis. No nephrolithiasis is noted.
IMPRESSION: Significantly improved small bowel dilatation is noted most
consistent with improving ileus.
